# Patient Record
Sex: Female | Born: 1975 | Race: Black or African American | Hispanic: No | Marital: Single | Smoking: Former smoker
Health system: Southern US, Community
[De-identification: ages and names within clinical notes are randomized; demographics above are authoritative.]

## PROBLEM LIST (undated history)

## (undated) ENCOUNTER — Inpatient Hospital Stay (HOSPITAL_COMMUNITY): Payer: Self-pay

## (undated) DIAGNOSIS — L03116 Cellulitis of left lower limb: Secondary | ICD-10-CM

## (undated) DIAGNOSIS — N059 Unspecified nephritic syndrome with unspecified morphologic changes: Secondary | ICD-10-CM

## (undated) DIAGNOSIS — D649 Anemia, unspecified: Secondary | ICD-10-CM

## (undated) DIAGNOSIS — R0600 Dyspnea, unspecified: Secondary | ICD-10-CM

## (undated) DIAGNOSIS — I10 Essential (primary) hypertension: Secondary | ICD-10-CM

## (undated) DIAGNOSIS — E119 Type 2 diabetes mellitus without complications: Secondary | ICD-10-CM

## (undated) DIAGNOSIS — R0989 Other specified symptoms and signs involving the circulatory and respiratory systems: Secondary | ICD-10-CM

## (undated) DIAGNOSIS — J020 Streptococcal pharyngitis: Secondary | ICD-10-CM

## (undated) DIAGNOSIS — M349 Systemic sclerosis, unspecified: Secondary | ICD-10-CM

## (undated) DIAGNOSIS — J189 Pneumonia, unspecified organism: Secondary | ICD-10-CM

## (undated) DIAGNOSIS — L03115 Cellulitis of right lower limb: Secondary | ICD-10-CM

## (undated) DIAGNOSIS — N189 Chronic kidney disease, unspecified: Secondary | ICD-10-CM

## (undated) DIAGNOSIS — F419 Anxiety disorder, unspecified: Secondary | ICD-10-CM

## (undated) DIAGNOSIS — E039 Hypothyroidism, unspecified: Secondary | ICD-10-CM

## (undated) DIAGNOSIS — K219 Gastro-esophageal reflux disease without esophagitis: Secondary | ICD-10-CM

---

## 2001-11-18 ENCOUNTER — Emergency Department (HOSPITAL_COMMUNITY): Admission: EM | Admit: 2001-11-18 | Discharge: 2001-11-18 | Payer: Self-pay | Admitting: Emergency Medicine

## 2001-11-18 ENCOUNTER — Encounter: Payer: Self-pay | Admitting: Emergency Medicine

## 2001-11-24 ENCOUNTER — Encounter: Payer: Self-pay | Admitting: Emergency Medicine

## 2001-11-24 ENCOUNTER — Emergency Department (HOSPITAL_COMMUNITY): Admission: EM | Admit: 2001-11-24 | Discharge: 2001-11-24 | Payer: Self-pay | Admitting: Emergency Medicine

## 2002-11-27 ENCOUNTER — Emergency Department (HOSPITAL_COMMUNITY): Admission: EM | Admit: 2002-11-27 | Discharge: 2002-11-28 | Payer: Self-pay | Admitting: Emergency Medicine

## 2002-12-01 ENCOUNTER — Emergency Department (HOSPITAL_COMMUNITY): Admission: EM | Admit: 2002-12-01 | Discharge: 2002-12-01 | Payer: Self-pay | Admitting: Emergency Medicine

## 2005-09-09 ENCOUNTER — Emergency Department (HOSPITAL_COMMUNITY): Admission: EM | Admit: 2005-09-09 | Discharge: 2005-09-09 | Payer: Self-pay | Admitting: Emergency Medicine

## 2005-10-18 ENCOUNTER — Encounter: Admission: RE | Admit: 2005-10-18 | Discharge: 2005-11-22 | Payer: Self-pay | Admitting: Family Medicine

## 2008-02-15 ENCOUNTER — Emergency Department (HOSPITAL_BASED_OUTPATIENT_CLINIC_OR_DEPARTMENT_OTHER): Admission: EM | Admit: 2008-02-15 | Discharge: 2008-02-15 | Payer: Self-pay | Admitting: Emergency Medicine

## 2008-11-06 ENCOUNTER — Emergency Department (HOSPITAL_COMMUNITY): Admission: EM | Admit: 2008-11-06 | Discharge: 2008-11-06 | Payer: Self-pay | Admitting: Emergency Medicine

## 2009-07-19 ENCOUNTER — Emergency Department (HOSPITAL_COMMUNITY): Admission: EM | Admit: 2009-07-19 | Discharge: 2009-07-19 | Payer: Self-pay | Admitting: Emergency Medicine

## 2009-12-07 ENCOUNTER — Ambulatory Visit (HOSPITAL_COMMUNITY): Admission: RE | Admit: 2009-12-07 | Discharge: 2009-12-07 | Payer: Self-pay | Admitting: Obstetrics

## 2010-05-27 ENCOUNTER — Emergency Department (HOSPITAL_COMMUNITY): Admission: EM | Admit: 2010-05-27 | Discharge: 2010-05-27 | Payer: Self-pay | Admitting: Family Medicine

## 2010-09-24 LAB — URINE MICROSCOPIC-ADD ON

## 2010-09-24 LAB — URINALYSIS, ROUTINE W REFLEX MICROSCOPIC
Bilirubin Urine: NEGATIVE
Glucose, UA: NEGATIVE mg/dL
Hgb urine dipstick: NEGATIVE
Ketones, ur: NEGATIVE mg/dL
Protein, ur: 30 mg/dL — AB
Specific Gravity, Urine: 1.025 (ref 1.005–1.030)
Urobilinogen, UA: 1 mg/dL (ref 0.0–1.0)
pH: 7 (ref 5.0–8.0)

## 2010-09-29 ENCOUNTER — Emergency Department (HOSPITAL_BASED_OUTPATIENT_CLINIC_OR_DEPARTMENT_OTHER)
Admission: EM | Admit: 2010-09-29 | Discharge: 2010-09-29 | Disposition: A | Discharge status: Home / Self Care | Payer: No Typology Code available for payment source | Attending: Emergency Medicine | Admitting: Emergency Medicine

## 2010-09-29 ENCOUNTER — Emergency Department (INDEPENDENT_AMBULATORY_CARE_PROVIDER_SITE_OTHER): Payer: No Typology Code available for payment source

## 2010-09-29 DIAGNOSIS — M7989 Other specified soft tissue disorders: Secondary | ICD-10-CM

## 2010-09-29 DIAGNOSIS — R609 Edema, unspecified: Secondary | ICD-10-CM | POA: Insufficient documentation

## 2010-09-29 DIAGNOSIS — N390 Urinary tract infection, site not specified: Secondary | ICD-10-CM | POA: Insufficient documentation

## 2010-09-29 DIAGNOSIS — R809 Proteinuria, unspecified: Secondary | ICD-10-CM | POA: Insufficient documentation

## 2010-09-29 DIAGNOSIS — R0789 Other chest pain: Secondary | ICD-10-CM

## 2010-09-29 DIAGNOSIS — R7309 Other abnormal glucose: Secondary | ICD-10-CM | POA: Insufficient documentation

## 2010-09-29 LAB — URINALYSIS, ROUTINE W REFLEX MICROSCOPIC
Bilirubin Urine: NEGATIVE
Hgb urine dipstick: NEGATIVE
Ketones, ur: NEGATIVE mg/dL
Nitrite: NEGATIVE
Protein, ur: 30 mg/dL — AB
Urobilinogen, UA: 0.2 mg/dL (ref 0.0–1.0)

## 2010-09-29 LAB — CBC
MCH: 28.3 pg (ref 26.0–34.0)
MCHC: 33 g/dL (ref 30.0–36.0)
MCV: 85.7 fL (ref 78.0–100.0)
Platelets: 374 10*3/uL (ref 150–400)
WBC: 8.8 10*3/uL (ref 4.0–10.5)

## 2010-09-29 LAB — BASIC METABOLIC PANEL
BUN: 10 mg/dL (ref 6–23)
CO2: 26 mEq/L (ref 19–32)
Calcium: 9 mg/dL (ref 8.4–10.5)
Chloride: 107 mEq/L (ref 96–112)
Creatinine, Ser: 0.6 mg/dL (ref 0.4–1.2)
GFR calc Af Amer: >60 mL/min (ref >60)
GFR calc non Af Amer: >60 mL/min (ref >60)
Glucose, Bld: 172 mg/dL — ABNORMAL HIGH (ref 70–99)
Potassium: 3.9 mEq/L (ref 3.5–5.1)
Sodium: 142 mEq/L (ref 135–145)

## 2010-09-29 LAB — DIFFERENTIAL
Basophils Absolute: 0.1 10*3/uL (ref 0.0–0.1)
Basophils Relative: 1 % (ref 0–1)
Monocytes Relative: 13 % — ABNORMAL HIGH (ref 3–12)

## 2010-09-29 LAB — URINE MICROSCOPIC-ADD ON

## 2010-09-29 LAB — PREGNANCY, URINE: Preg Test, Ur: NEGATIVE

## 2011-01-15 ENCOUNTER — Other Ambulatory Visit (HOSPITAL_COMMUNITY): Payer: Self-pay | Admitting: Obstetrics

## 2011-01-15 DIAGNOSIS — Z1231 Encounter for screening mammogram for malignant neoplasm of breast: Secondary | ICD-10-CM

## 2011-02-19 ENCOUNTER — Ambulatory Visit (HOSPITAL_COMMUNITY)
Admission: RE | Admit: 2011-02-19 | Discharge: 2011-02-19 | Disposition: A | Discharge status: Home / Self Care | Payer: BC Managed Care – PPO | Source: Ambulatory Visit | Attending: Obstetrics | Admitting: Obstetrics

## 2011-02-19 DIAGNOSIS — Z1231 Encounter for screening mammogram for malignant neoplasm of breast: Secondary | ICD-10-CM | POA: Insufficient documentation

## 2011-04-06 LAB — URINALYSIS, ROUTINE W REFLEX MICROSCOPIC
Glucose, UA: NEGATIVE
Leukocytes, UA: NEGATIVE
Urobilinogen, UA: 1

## 2011-04-06 LAB — URINE MICROSCOPIC-ADD ON

## 2012-03-31 ENCOUNTER — Emergency Department (HOSPITAL_COMMUNITY)
Admission: EM | Admit: 2012-03-31 | Discharge: 2012-03-31 | Disposition: A | Discharge status: Home / Self Care | Payer: Self-pay | Attending: Emergency Medicine | Admitting: Emergency Medicine

## 2012-03-31 ENCOUNTER — Emergency Department (HOSPITAL_COMMUNITY)
Admission: EM | Admit: 2012-03-31 | Discharge: 2012-03-31 | Discharge status: Against Medical Advice | Payer: Self-pay | Attending: Emergency Medicine | Admitting: Emergency Medicine

## 2012-03-31 ENCOUNTER — Encounter (HOSPITAL_COMMUNITY): Payer: Self-pay

## 2012-03-31 DIAGNOSIS — L02419 Cutaneous abscess of limb, unspecified: Secondary | ICD-10-CM | POA: Insufficient documentation

## 2012-03-31 DIAGNOSIS — Z880 Allergy status to penicillin: Secondary | ICD-10-CM | POA: Insufficient documentation

## 2012-03-31 DIAGNOSIS — M7989 Other specified soft tissue disorders: Secondary | ICD-10-CM | POA: Insufficient documentation

## 2012-03-31 DIAGNOSIS — L0390 Cellulitis, unspecified: Secondary | ICD-10-CM

## 2012-03-31 DIAGNOSIS — R0609 Other forms of dyspnea: Secondary | ICD-10-CM | POA: Insufficient documentation

## 2012-03-31 DIAGNOSIS — R0989 Other specified symptoms and signs involving the circulatory and respiratory systems: Secondary | ICD-10-CM | POA: Insufficient documentation

## 2012-03-31 LAB — BASIC METABOLIC PANEL
CO2: 25 mEq/L (ref 19–32)
Calcium: 9.2 mg/dL (ref 8.4–10.5)
Chloride: 101 mEq/L (ref 96–112)
Creatinine, Ser: 0.63 mg/dL (ref 0.50–1.10)
GFR calc non Af Amer: >90 mL/min (ref >90)
Potassium: 3.7 mEq/L (ref 3.5–5.1)

## 2012-03-31 LAB — CBC WITH DIFFERENTIAL/PLATELET
Basophils Absolute: 0.1 10*3/uL (ref 0.0–0.1)
HCT: 40.7 % (ref 36.0–46.0)
Hemoglobin: 13.5 g/dL (ref 12.0–15.0)
Lymphocytes Relative: 49 % — ABNORMAL HIGH (ref 12–46)
Lymphs Abs: 4.9 10*3/uL — ABNORMAL HIGH (ref 0.7–4.0)
MCH: 28.3 pg (ref 26.0–34.0)
MCHC: 33.2 g/dL (ref 30.0–36.0)
Monocytes Absolute: 0.8 10*3/uL (ref 0.1–1.0)
Neutro Abs: 4 10*3/uL (ref 1.7–7.7)
RBC: 4.77 MIL/uL (ref 3.87–5.11)
WBC: 10.1 10*3/uL (ref 4.0–10.5)

## 2012-03-31 LAB — URINALYSIS, ROUTINE W REFLEX MICROSCOPIC
Glucose, UA: NEGATIVE mg/dL
Leukocytes, UA: NEGATIVE
Nitrite: NEGATIVE
pH: 5.5 (ref 5.0–8.0)

## 2012-03-31 LAB — POCT PREGNANCY, URINE: Preg Test, Ur: NEGATIVE

## 2012-03-31 LAB — URINE MICROSCOPIC-ADD ON

## 2012-03-31 MED ORDER — CLINDAMYCIN HCL 300 MG PO CAPS: 300.0000 mg | ORAL_CAPSULE | Freq: Three times a day (TID) | ORAL | Status: DC

## 2012-03-31 NOTE — ED Notes (Signed)
Lab drawing blood now
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
## 2012-03-31 NOTE — ED Notes (Signed)
C/o bilateral leg swelling since Friday, warm to touch, painful to touch, non-pitting edema, pain 9/10, pt ambulatory. CMS intact.

## 2012-03-31 NOTE — ED Notes (Signed)
Pt has bilateral leg swelling started Friday that is warm to touch, ambulatory, painful to touch and is now red and swollen.

## 2012-04-03 NOTE — ED Provider Notes (Signed)
Medical screening examination/treatment/procedure(s) were performed by non-physician practitioner and as supervising physician I was immediately available for consultation/collaboration.   Gerhard Munch, MD 04/03/12 [phone]

## 2012-04-21 ENCOUNTER — Encounter (HOSPITAL_COMMUNITY): Payer: Self-pay | Admitting: *Deleted

## 2012-04-21 DIAGNOSIS — L03119 Cellulitis of unspecified part of limb: Secondary | ICD-10-CM | POA: Insufficient documentation

## 2012-04-21 DIAGNOSIS — L02419 Cutaneous abscess of limb, unspecified: Principal | ICD-10-CM | POA: Insufficient documentation

## 2012-04-21 DIAGNOSIS — Z792 Long term (current) use of antibiotics: Secondary | ICD-10-CM | POA: Insufficient documentation

## 2012-04-21 LAB — CBC WITH DIFFERENTIAL/PLATELET
Eosinophils Absolute: 0.2 10*3/uL (ref 0.0–0.7)
Eosinophils Relative: 2 % (ref 0–5)
HCT: 38.6 % (ref 36.0–46.0)
Lymphocytes Relative: 36 % (ref 12–46)
Lymphs Abs: 3.8 10*3/uL (ref 0.7–4.0)
MCH: 28.4 pg (ref 26.0–34.0)
MCV: 85 fL (ref 78.0–100.0)
Monocytes Absolute: 1 10*3/uL (ref 0.1–1.0)
Monocytes Relative: 9 % (ref 3–12)
Platelets: 388 10*3/uL (ref 150–400)
RBC: 4.54 MIL/uL (ref 3.87–5.11)

## 2012-04-21 LAB — COMPREHENSIVE METABOLIC PANEL
BUN: 11 mg/dL (ref 6–23)
CO2: 26 mEq/L (ref 19–32)
Calcium: 9.6 mg/dL (ref 8.4–10.5)
Creatinine, Ser: 0.67 mg/dL (ref 0.50–1.10)
GFR calc Af Amer: >90 mL/min (ref >90)
GFR calc non Af Amer: >90 mL/min (ref >90)
Glucose, Bld: 139 mg/dL — ABNORMAL HIGH (ref 70–99)
Total Protein: 7.7 g/dL (ref 6.0–8.3)

## 2012-04-21 NOTE — ED Notes (Signed)
Pt states she was seen at [facility] for her cellulitis, pt states that she took a few days of her antibiotic and it got better and then this past Sunday redness, swelling up back of left leg. Pt states she feels like there is sharp pain going up her left calf muscle.

## 2012-04-22 ENCOUNTER — Encounter (HOSPITAL_COMMUNITY): Payer: Self-pay | Admitting: Emergency Medicine

## 2012-04-22 ENCOUNTER — Observation Stay (HOSPITAL_COMMUNITY)
Admission: EM | Admit: 2012-04-22 | Discharge: 2012-04-22 | Disposition: A | Discharge status: Home / Self Care | Payer: Self-pay | Attending: Emergency Medicine | Admitting: Emergency Medicine

## 2012-04-22 DIAGNOSIS — L0390 Cellulitis, unspecified: Secondary | ICD-10-CM

## 2012-04-22 HISTORY — DX: Anemia, unspecified: D64.9

## 2012-04-22 HISTORY — DX: Streptococcal pharyngitis: J02.0

## 2012-04-22 MED ORDER — CLINDAMYCIN PHOSPHATE 600 MG/50ML IV SOLN
600.0000 mg | Freq: Three times a day (TID) | INTRAVENOUS | Status: DC
  Administered 2012-04-22 (×2): 600 mg via INTRAVENOUS
  Filled 2012-04-22 (×2): qty 50

## 2012-04-22 MED ORDER — MORPHINE SULFATE 4 MG/ML IJ SOLN
4.0000 mg | INTRAMUSCULAR | Status: DC | PRN
Start: 2012-04-22 — End: 2012-04-22
  Administered 2012-04-22 (×3): 4 mg via INTRAVENOUS
  Filled 2012-04-22 (×4): qty 1

## 2012-04-22 MED ORDER — VANCOMYCIN HCL IN DEXTROSE 1-5 GM/200ML-% IV SOLN
1000.0000 mg | Freq: Once | INTRAVENOUS | Status: AC
  Administered 2012-04-22: 1000 mg via INTRAVENOUS
  Filled 2012-04-22: qty 200

## 2012-04-22 MED ORDER — CLINDAMYCIN HCL 150 MG PO CAPS: 450.0000 mg | ORAL_CAPSULE | Freq: Three times a day (TID) | ORAL | Status: DC

## 2012-04-22 MED ORDER — SODIUM CHLORIDE 0.9 % IV SOLN
1000.0000 mL | INTRAVENOUS | Status: DC
  Administered 2012-04-22: 1000 mL via INTRAVENOUS

## 2012-04-22 MED ORDER — SODIUM CHLORIDE 0.9 % IV SOLN
1000.0000 mL | Freq: Once | INTRAVENOUS | Status: AC
  Administered 2012-04-22: 1000 mL via INTRAVENOUS

## 2012-04-22 MED ORDER — ONDANSETRON HCL 4 MG/2ML IJ SOLN
4.0000 mg | Freq: Four times a day (QID) | INTRAMUSCULAR | Status: DC | PRN
  Administered 2012-04-22: 4 mg via INTRAVENOUS
  Filled 2012-04-22: qty 2

## 2012-04-22 NOTE — ED Notes (Signed)
Warm  Compress applied to mild swelling to Rt. Posterior wrist area.  IV site removed.  Pt. Denies any pain or discomfort

## 2012-04-22 NOTE — ED Notes (Signed)
Wait time advised 

## 2012-04-22 NOTE — ED Notes (Signed)
Pt was seen on the 23rd of Oct for cellulitis at [facility].  Pt was taking clindamycin for seven days, which she completed.  She states that the pain has increased and she has noticed the redness is progressively moving upward.  States, "Around my ankles it feels like when you burn your skin with hot grease."

## 2012-04-22 NOTE — ED Provider Notes (Signed)
Rebecca Mullins is a 36 y.o. female on cellulitis protocol is signed out from [facility] B by Dr. Dierdre Highman: Patient has received one dose of IV clindamycin she is scheduled for her second dose of IV clindamycin at 2 PM. Plan is to DC her after her second dose with by mouth clindamycin.  [id number] Physical exam shows patient's resting comfortably. Vital signs stable. Patient is in minimal pain at this point. Circumferential cellulitis to bilateral lower extremities marked with surgical pain. Patient states her pain and the level of the size of the infected area is decreasing.  3:01 PM patient has completed her second dose of IV clindamycin. Erythema and induration or proceeding slowly. She reports a significant subjective improvement in pain in range of motion. I will discharge on oral clindamycin. I've advised her to have a low threshold for returning to the ED if there is any increase in induration, redness, swelling or tingling or paresthesia to the feet as she has circumferential bilateral cellulitis.   Pt verbalized understanding and agrees with care plan. Outpatient follow-up and return precautions given.    New Prescriptions   CLINDAMYCIN (CLEOCIN) 150 MG CAPSULE    Take 3 capsules (450 mg total) by mouth 3 (three) times daily.     Wynetta Emery, PA-C 04/22/12 1527

## 2012-04-22 NOTE — ED Provider Notes (Signed)
History     CSN: [id number]  Arrival date & time 04/21/12  2103   First MD Initiated Contact with Patient 04/22/12 0241      Chief Complaint  Patient presents with  . Recurrent Skin Infections    cellulitis    (Consider location/radiation/quality/duration/timing/severity/associated sxs/prior treatment) HPI   Hx per PT. Went to [facility] 03-31-12 and Dx with cellulitis and given ABx. Followed by [facility] physicians. Now having recurrent redness and pain and swelling and inc warmth tot ouch. Works in nursing home. No F/C. No vomitig, some nausea. Mod in severity. No DM. No trauma. Sharp pains. No calf pain.  Past Medical History  Diagnosis Date  . Cellulitis   . Anemia   . Strep throat     Past Surgical History  Procedure Date  . No past surgeries     Family History  Problem Relation Age of Onset  . Diabetes Mother   . Cancer Mother   . Hyperlipidemia Father     History  Substance Use Topics  . Smoking status: Never Smoker   . Smokeless tobacco: Not on file  . Alcohol Use: 1.2 oz/week    2 Glasses of wine per week     occsionally    OB History    Grav Para Term Preterm Abortions TAB SAB Ect Mult Living                  Review of Systems  Constitutional: Negative for fever and chills.  HENT: Negative for neck pain and neck stiffness.   Eyes: Negative for pain.  Respiratory: Negative for shortness of breath.   Cardiovascular: Negative for chest pain.  Gastrointestinal: Negative for abdominal pain.  Genitourinary: Negative for dysuria.  Musculoskeletal: Negative for back pain.  Skin: Positive for rash.  Neurological: Negative for headaches.  All other systems reviewed and are negative.    Allergies  Penicillins  Home Medications   Current Outpatient Rx  Name Route Sig Dispense Refill  . ACETAMINOPHEN 500 MG PO TABS Oral Take 1,000 mg by mouth every 6 (six) hours as needed. For pain.      BP 161/97  Pulse 89  Temp 97.9 F (36.6 C) (Oral)  Resp 16   SpO2 99%  LMP 03/28/2012  Physical Exam  Nursing note and vitals reviewed. Constitutional: She is oriented to person, place, and time. She appears well-developed and well-nourished.  HENT:  Head: Normocephalic and atraumatic.  Eyes: EOM are normal. Pupils are equal, round, and reactive to light.  Neck: Neck supple.  Cardiovascular: Normal rate, normal heart sounds and intact distal pulses.   Pulmonary/Chest: Effort normal and breath sounds normal. No respiratory distress.  Musculoskeletal:       BLEs with areas of swelling, erythema and inc warmth to touch c/w cellulitis. No cords and neg Homans  Neurological: She is alert and oriented to person, place, and time.  Skin: Skin is warm and dry.    ED Course  Procedures (including critical care time)  Results for orders placed during the hospital encounter of 04/22/12  CBC WITH DIFFERENTIAL      Component Value Range   WBC 10.6 (*) 4.0 - 10.5 K/uL   RBC 4.54  3.87 - 5.11 MIL/uL   Hemoglobin 12.9  12.0 - 15.0 g/dL   HCT 40.9  81.1 - 91.4 %   MCV 85.0  78.0 - 100.0 fL   MCH 28.4  26.0 - 34.0 pg   MCHC 33.4  30.0 - 36.0 g/dL  RDW 13.6  11.5 - 15.5 %   Platelets 388  150 - 400 K/uL   Neutrophils Relative 53  43 - 77 %   Neutro Abs 5.6  1.7 - 7.7 K/uL   Lymphocytes Relative 36  12 - 46 %   Lymphs Abs 3.8  0.7 - 4.0 K/uL   Monocytes Relative 9  3 - 12 %   Monocytes Absolute 1.0  0.1 - 1.0 K/uL   Eosinophils Relative 2  0 - 5 %   Eosinophils Absolute 0.2  0.0 - 0.7 K/uL   Basophils Relative 0  0 - 1 %   Basophils Absolute 0.0  0.0 - 0.1 K/uL  COMPREHENSIVE METABOLIC PANEL      Component Value Range   Sodium 139  135 - 145 mEq/L   Potassium 3.5  3.5 - 5.1 mEq/L   Chloride 103  96 - 112 mEq/L   CO2 26  19 - 32 mEq/L   Glucose, Bld 139 (*) 70 - 99 mg/dL   BUN 11  6 - 23 mg/dL   Creatinine, Ser 1.61  0.50 - 1.10 mg/dL   Calcium 9.6  8.4 - 09.6 mg/dL   Total Protein 7.7  6.0 - 8.3 g/dL   Albumin 3.4 (*) 3.5 - 5.2 g/dL   AST 22   0 - 37 U/L   ALT 17  0 - 35 U/L   Alkaline Phosphatase 117  39 - 117 U/L   Total Bilirubin 0.2 (*) 0.3 - 1.2 mg/dL   GFR calc non Af Amer >90  >90 mL/min   GFR calc Af Amer >90  >90 mL/min   IVfs. IV zofran. IV clinda and cellulitis OBS protocol  Plan IV ABx and serial evaluations, if not worsening d/c home and close outpatient follow up.  MDM   Cellulitis. Labs. IV Abx.         Sunnie Nielsen, MD 04/22/12 [phone]

## 2012-04-22 NOTE — ED Notes (Signed)
Pt. Eating breakfast.  Pt brushed teeth and freshened up.

## 2012-04-22 NOTE — Progress Notes (Signed)
Utilization review completed.  

## 2012-04-23 NOTE — ED Provider Notes (Signed)
Medical screening examination/treatment/procedure(s) were performed by non-physician practitioner and as supervising physician I was immediately available for consultation/collaboration.   Dione Booze, MD 04/23/12 [phone]

## 2013-01-07 ENCOUNTER — Ambulatory Visit: Payer: No Typology Code available for payment source | Attending: Family Medicine | Admitting: Internal Medicine

## 2013-01-07 VITALS — BP 142/90 | HR 91 | Temp 98.3°F | Resp 16 | Ht 63.0 in | Wt 266.0 lb

## 2013-01-07 DIAGNOSIS — L03119 Cellulitis of unspecified part of limb: Secondary | ICD-10-CM | POA: Insufficient documentation

## 2013-01-07 DIAGNOSIS — L0390 Cellulitis, unspecified: Secondary | ICD-10-CM

## 2013-01-07 DIAGNOSIS — L02419 Cutaneous abscess of limb, unspecified: Secondary | ICD-10-CM | POA: Insufficient documentation

## 2013-01-07 DIAGNOSIS — L0291 Cutaneous abscess, unspecified: Secondary | ICD-10-CM

## 2013-01-07 MED ORDER — FUROSEMIDE 20 MG PO TABS: 20.0000 mg | ORAL_TABLET | Freq: Every day | ORAL | Status: DC

## 2013-01-07 MED ORDER — HYDROCODONE-ACETAMINOPHEN 10-325 MG PO TABS: 1.0000 | ORAL_TABLET | Freq: Three times a day (TID) | ORAL | Status: DC | PRN

## 2013-01-07 MED ORDER — SULFAMETHOXAZOLE-TRIMETHOPRIM 800-160 MG PO TABS: 1.0000 | ORAL_TABLET | Freq: Two times a day (BID) | ORAL | Status: DC

## 2013-01-07 NOTE — Patient Instructions (Addendum)
Cellulitis Cellulitis is an infection of the skin and the tissue beneath it. The infected area is usually red and tender. Cellulitis occurs most often in the arms and lower legs.  CAUSES  Cellulitis is caused by bacteria that enter the skin through cracks or cuts in the skin. The most common types of bacteria that cause cellulitis are Staphylococcus and Streptococcus. SYMPTOMS   Redness and warmth.  Swelling.  Tenderness or pain.  Fever. DIAGNOSIS  Your caregiver can usually determine what is wrong based on a physical exam. Blood tests may also be done. TREATMENT  Treatment usually involves taking an antibiotic medicine. HOME CARE INSTRUCTIONS   Take your antibiotics as directed. Finish them even if you start to feel better.  Keep the infected arm or leg elevated to reduce swelling.  Apply a warm cloth to the affected area up to 4 times per day to relieve pain.  Only take over-the-counter or prescription medicines for pain, discomfort, or fever as directed by your caregiver.  Keep all follow-up appointments as directed by your caregiver. SEEK MEDICAL CARE IF:   You notice red streaks coming from the infected area.  Your red area gets larger or turns dark in color.  Your bone or joint underneath the infected area becomes painful after the skin has healed.  Your infection returns in the same area or another area.  You notice a swollen bump in the infected area.  You develop new symptoms. SEEK IMMEDIATE MEDICAL CARE IF:   You have a fever.  You feel very sleepy.  You develop vomiting or diarrhea.  You have a general ill feeling (malaise) with muscle aches and pains. MAKE SURE YOU:   Understand these instructions.  Will watch your condition.  Will get help right away if you are not doing well or get worse. Document Released: 04/04/2005 Document Revised: 12/25/2011 Document Reviewed: 09/10/2011 Exit[employer] Patient Information 2014 [employer].  

## 2013-01-07 NOTE — Progress Notes (Signed)
Patient ID: Rebecca Mullins, female   DOB: 1976-03-13, 37 y.o.   MRN: [id number]   CC: Lower extremity swelling  HPI: Patient is 37 year old female who presents to clinic with main concern of progressively worsening lower extremity swelling that started 2 days prior to this visit associated with redness and tenderness to palpation, warm to touch. Patient describes similar event in the past it was related to cellulitis and has required antibiotic treatment. Patient denies other traumas to the area no other systemic symptoms, no fevers or chills, no abdominal or urinary concerns, no focal neurological symptoms such as numbness or tingling.  Allergies  Allergen Reactions  . Penicillins    Past Medical History  Diagnosis Date  . Cellulitis   . Anemia   . Strep throat    Current Outpatient Prescriptions on File Prior to Visit  Medication Sig Dispense Refill  . acetaminophen (TYLENOL) 500 MG tablet Take 1,000 mg by mouth every 6 (six) hours as needed. For pain.      . clindamycin (CLEOCIN) 150 MG capsule Take 3 capsules (450 mg total) by mouth 3 (three) times daily.  90 capsule  0   No current facility-administered medications on file prior to visit.   Family History  Problem Relation Age of Onset  . Diabetes Mother   . Cancer Mother   . Hyperlipidemia Father   . Diabetes Brother    History   Social History  . Marital Status: Single    Spouse Name: N/A    Number of Children: N/A  . Years of Education: N/A   Occupational History  . Not on file.   Social History Main Topics  . Smoking status: Never Smoker   . Smokeless tobacco: Not on file  . Alcohol Use: 1.2 oz/week    2 Glasses of wine per week     Comment: occsionally  . Drug Use: No  . Sexually Active: Not on file   Other Topics Concern  . Not on file   Social History Narrative  . No narrative on file    Review of Systems  Constitutional: Negative for fever, chills, diaphoresis, activity change, appetite change and  fatigue.  HENT: Negative for ear pain, nosebleeds, congestion, facial swelling, rhinorrhea, neck pain, neck stiffness and ear discharge.   Eyes: Negative for pain, discharge, redness, itching and visual disturbance.  Respiratory: Negative for cough, choking, chest tightness, shortness of breath, wheezing and stridor.   Cardiovascular: Negative for chest pain, palpitations and leg swelling.  Gastrointestinal: Negative for abdominal distention.  Genitourinary: Negative for dysuria, urgency, frequency, hematuria, flank pain, decreased urine volume, difficulty urinating and dyspareunia.  Musculoskeletal: Negative for back pain, joint swelling Neurological: Negative for dizziness, tremors, seizures, syncope, facial asymmetry, speech difficulty, weakness, light-headedness, numbness and headaches.  Hematological: Negative for adenopathy. Does not bruise/bleed easily.  Psychiatric/Behavioral: Negative for hallucinations, behavioral problems, confusion, dysphoric mood, decreased concentration and agitation.    Objective:   Filed Vitals:   01/07/13 1600  BP: 142/90  Pulse: 91  Temp: 98.3 F (36.8 C)  Resp: 16    Physical Exam  Constitutional: Appears well-developed and well-nourished. No distress.  HENT: Normocephalic. External right and left ear normal. Oropharynx is clear and moist.  Eyes: Conjunctivae and EOM are normal. PERRLA, no scleral icterus.  Neck: Normal ROM. Neck supple. No JVD. No tracheal deviation. No thyromegaly.  CVS: RRR, S1/S2 +, no murmurs, no gallops, no carotid bruit.  Pulmonary: Effort and breath sounds normal, no stridor, rhonchi, wheezes, rales.  Abdominal: Soft. BS +,  no distension, tenderness, rebound or guarding.  Musculoskeletal: Normal range of motion. Bilateral lower extremity edema and erythema, tenderness to palpation extending from ankles to mid shin area  Lymphadenopathy: No lymphadenopathy noted, cervical, inguinal. Neuro: Alert. Normal reflexes, muscle  tone coordination. No cranial nerve deficit. Skin: Skin is warm and dry. No rash noted. Not diaphoretic. No erythema. No pallor.  Psychiatric: Normal mood and affect. Behavior, judgment, thought content normal.   Lab Results  Component Value Date   WBC 10.6* 04/21/2012   HGB 12.9 04/21/2012   HCT 38.6 04/21/2012   MCV 85.0 04/21/2012   PLT 388 04/21/2012   Lab Results  Component Value Date   CREATININE 0.67 04/21/2012   BUN 11 04/21/2012   NA 139 04/21/2012   K 3.5 04/21/2012   CL 103 04/21/2012   CO2 26 04/21/2012    No results found for this basename: HGBA1C   Lipid Panel  No results found for this basename: chol, trig, hdl, cholhdl, vldl, ldlcalc       Assessment and plan:   Lower extremity cellulitis - physical exam findings and symptoms consistent with cellulitis, will start patient on Bactrim double strength for 10 days, patient advised to keep extremities elevated while sitting or laying down in bed and to come back and see [place] if her symptoms do not improve over the next 2-3 days

## 2013-01-07 NOTE — Progress Notes (Signed)
Pt here to establish care for re occurrent cellulitis of left leg. Pt was treated for sx in Nov.2013 and prescribed Clindamycin, but states sx has returned last week. Worsening with pulsating pain radiating to l ankle.discoloration,hot to touch noted.

## 2013-01-08 ENCOUNTER — Telehealth: Payer: Self-pay | Admitting: Family Medicine

## 2013-01-08 NOTE — Telephone Encounter (Signed)
Pt would like a Dr's note that states work limitations and recommendations for rest while her leg swelling decreases.  Pt here on 01/08/13 and received a letter saying that per Dr's note it is recommended that pt keeps legs elevated while resting.  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

## 2013-01-08 NOTE — Telephone Encounter (Signed)
Pt here yesterday and would to receive letter for her work stating that she needs to rest her leg at least for a week.  Pt says Dr Izola Price recommended this but pt needs proof for employer.

## 2013-01-19 ENCOUNTER — Ambulatory Visit: Payer: No Typology Code available for payment source | Attending: Family Medicine | Admitting: Internal Medicine

## 2013-01-19 ENCOUNTER — Ambulatory Visit: Payer: No Typology Code available for payment source

## 2013-01-19 VITALS — BP 140/85 | HR 98 | Temp 98.6°F | Resp 16 | Ht 62.0 in | Wt 262.0 lb

## 2013-01-19 DIAGNOSIS — L0390 Cellulitis, unspecified: Secondary | ICD-10-CM

## 2013-01-19 DIAGNOSIS — R609 Edema, unspecified: Secondary | ICD-10-CM | POA: Insufficient documentation

## 2013-01-19 DIAGNOSIS — L02419 Cutaneous abscess of limb, unspecified: Secondary | ICD-10-CM | POA: Insufficient documentation

## 2013-01-19 LAB — CBC
HCT: 40.3 % (ref 36.0–46.0)
Hemoglobin: 13.9 g/dL (ref 12.0–15.0)
WBC: 8 10*3/uL (ref 4.0–10.5)

## 2013-01-19 MED ORDER — DOXYCYCLINE HYCLATE 100 MG PO TABS: 100.0000 mg | ORAL_TABLET | Freq: Two times a day (BID) | ORAL | Status: DC

## 2013-01-19 MED ORDER — POTASSIUM CHLORIDE CRYS ER 20 MEQ PO TBCR: 20.0000 meq | EXTENDED_RELEASE_TABLET | Freq: Every day | ORAL | Status: DC

## 2013-01-19 MED ORDER — FUROSEMIDE 40 MG PO TABS: 40.0000 mg | ORAL_TABLET | Freq: Every day | ORAL | Status: DC

## 2013-01-19 NOTE — Progress Notes (Signed)
Patient presents for follow up of celloulitis of left and right legs; and states the pain has increased and has spread to back of legs and ankles. Wants to know if she can stay on antibiotic to get rid of infection.

## 2013-01-19 NOTE — Patient Instructions (Signed)
1.8 L per day fluid restriction, keep his legs elevated if you are not walking. Come back in 2 days to followup on ultrasound lab work.

## 2013-01-19 NOTE — Progress Notes (Signed)
Patient ID: Rebecca Mullins, female   DOB: [birth year], 37 y.o.   MRN: [id number] Patient Demographics  Calani Gick, is a 37 y.o. female  WUJ:[id number]  [id number]  DOB - 08-20-1975  Chief Complaint  Patient presents with  . Follow-up  . Cellulitis        Subjective:   Rebecca Mullins with History of cellulitis of the left leg in October of last year  is here for followup on repeat left leg cellulitis and bilateral lower extremity edema for the last 10 days, she was seen in this clinic 10 days ago and placed on Bactrim without much benefit, she continues to have bilateral lower activity swelling with some redness and warmth in the leg left leg, denies any fever chills, denies any chest pain or shortness of breath.  Denies any subjective complaints except as above, no active headache, no chest abdominal pain at this time, not short of breath. No focal weakness which is new.  Objective:    Patient Active Problem List   Diagnosis Date Noted  . Edema 01/19/2013  . Cellulitis 01/19/2013     Filed Vitals:   01/19/13 1558  BP: 140/85  Pulse: 98  Temp: 98.6 F (37 C)  TempSrc: Oral  Resp: 16  Height: 5\' 2"  (1.575 m)  Weight: 262 lb (118.842 kg)  SpO2: 97%     Exam   Awake Alert, Oriented X 3, No new F.N deficits, Normal affect [place].AT,PERRAL Supple Neck, mildly elevated JVD, No cervical lymphadenopathy appriciated.  Symmetrical Chest wall movement, Good air movement bilaterally, CTAB RRR,No Gallops,Rubs or new Murmurs, No Parasternal Heave +ve B.Sounds, Abd Soft, Non tender, No organomegaly appriciated, No rebound - guarding or rigidity. No Cyanosis, Clubbing , bilateral 1+ edema left leg more than right, mild warmth on the left leg, some discoloration in the left leg around her ankles.    Data Review   CBC No results found for this basename: WBC, HGB, HCT, PLT, MCV, MCH, MCHC, RDW, NEUTRABS, LYMPHSABS, MONOABS, EOSABS, BASOSABS, BANDABS, BANDSABD,  in the last  168 hours  Chemistries   No results found for this basename: NA, K, CL, CO2, GLUCOSE, BUN, CREATININE, GFRCGP, CALCIUM, MG, AST, ALT, ALKPHOS, BILITOT,  in the last 168 hours ------------------------------------------------------------------------------------------------------------------ No results found for this basename: HGBA1C,  in the last 72 hours ------------------------------------------------------------------------------------------------------------------ No results found for this basename: CHOL, HDL, LDLCALC, TRIG, CHOLHDL, LDLDIRECT,  in the last 72 hours ------------------------------------------------------------------------------------------------------------------ No results found for this basename: TSH, T4TOTAL, FREET3, T3FREE, THYROIDAB,  in the last 72 hours ------------------------------------------------------------------------------------------------------------------ No results found for this basename: VITAMINB12, FOLATE, FERRITIN, TIBC, IRON, RETICCTPCT,  in the last 72 hours  Coagulation profile  No results found for this basename: INR, PROTIME,  in the last 168 hours     Prior to Admission medications   Medication Sig Start Date End Date Taking? Authorizing Provider  acetaminophen (TYLENOL) 500 MG tablet Take 1,000 mg by mouth every 6 (six) hours as needed. For pain.   Yes Historical Provider, MD  furosemide (LASIX) 40 MG tablet Take 1 tablet (40 mg total) by mouth daily. 01/19/13   Leroy Sea, MD  potassium chloride SA (K-DUR,KLOR-CON) 20 MEQ tablet Take 1 tablet (20 mEq total) by mouth daily. 01/19/13   Leroy Sea, MD     Assessment & Plan   1. Left>Right  leg edema with mild cellulitis.- Patient has completed 10 day course of Bactrim without much benefit, I suspect this is more for edema problem, will place  her on Lasix 40 mg daily along with low-dose potassium, will try doxycycline. Her urine pregnancy test here is negative. We'll also check  bilateral lower extremity venous duplex and get her back in 2 days. Note she was on Bactrim and Lasix since her last visit, will like to check CBC, BMP and TSH at this time. Of note last year when she had cellulitis she says she was ruled out for DVT at that time. Also I noted elevated JVD will order a baseline echo gram to rule out right-sided heart dysfunction.   2. Morbid obesity counseled on diet and exercise.     Routine health maintenance.  Screening labs. CBC, CMP, TSH, A1c, ordered will come back in 3 days      Leroy Sea M.D on 01/19/2013 at 4:17 PM

## 2013-01-20 ENCOUNTER — Ambulatory Visit (HOSPITAL_COMMUNITY)
Admission: RE | Admit: 2013-01-20 | Discharge: 2013-01-20 | Disposition: A | Discharge status: Home / Self Care | Payer: No Typology Code available for payment source | Source: Ambulatory Visit | Attending: Internal Medicine | Admitting: Internal Medicine

## 2013-01-20 DIAGNOSIS — L0390 Cellulitis, unspecified: Secondary | ICD-10-CM

## 2013-01-20 DIAGNOSIS — M7989 Other specified soft tissue disorders: Secondary | ICD-10-CM

## 2013-01-20 DIAGNOSIS — R609 Edema, unspecified: Secondary | ICD-10-CM

## 2013-01-20 DIAGNOSIS — M79609 Pain in unspecified limb: Secondary | ICD-10-CM | POA: Insufficient documentation

## 2013-01-20 LAB — URINALYSIS W MICROSCOPIC + REFLEX CULTURE
Bacteria, UA: NONE SEEN
Casts: NONE SEEN
Glucose, UA: NEGATIVE mg/dL
Hgb urine dipstick: NEGATIVE
Ketones, ur: NEGATIVE mg/dL
Protein, ur: 30 mg/dL — AB
pH: 5.5 (ref 5.0–8.0)

## 2013-01-20 LAB — COMPLETE METABOLIC PANEL WITH GFR
AST: 18 U/L (ref 0–37)
Albumin: 4.1 g/dL (ref 3.5–5.2)
Alkaline Phosphatase: 83 U/L (ref 39–117)
BUN: 9 mg/dL (ref 6–23)
Calcium: 9.8 mg/dL (ref 8.4–10.5)
Chloride: 103 mEq/L (ref 96–112)
GFR, Est Non African American: 86 mL/min
Glucose, Bld: 109 mg/dL — ABNORMAL HIGH (ref 70–99)
Potassium: 4.2 mEq/L (ref 3.5–5.3)
Sodium: 136 mEq/L (ref 135–145)
Total Protein: 7.9 g/dL (ref 6.0–8.3)

## 2013-01-20 LAB — TSH: TSH: 4.613 u[IU]/mL — ABNORMAL HIGH (ref 0.350–4.500)

## 2013-01-20 NOTE — Progress Notes (Signed)
VASCULAR LAB PRELIMINARY  PRELIMINARY  PRELIMINARY  PRELIMINARY  Bilateral lower extremity venous duplex  completed.    Preliminary report:  Bilateral:  No evidence of DVT, superficial thrombosis, or Baker's Cyst.    Rochell Puett, RVT 01/20/2013, 8:47 AM

## 2013-01-21 ENCOUNTER — Ambulatory Visit (HOSPITAL_COMMUNITY)
Admission: RE | Admit: 2013-01-21 | Discharge: 2013-01-21 | Disposition: A | Discharge status: Home / Self Care | Payer: No Typology Code available for payment source | Source: Ambulatory Visit | Attending: Internal Medicine | Admitting: Internal Medicine

## 2013-01-21 DIAGNOSIS — R609 Edema, unspecified: Secondary | ICD-10-CM

## 2013-01-21 DIAGNOSIS — L0390 Cellulitis, unspecified: Secondary | ICD-10-CM

## 2013-01-21 LAB — URINE CULTURE: Colony Count: 2000

## 2013-01-21 NOTE — Progress Notes (Signed)
Echocardiogram 2D Echocardiogram has been performed.  Rebecca Mullins 01/21/2013, 11:38 AM

## 2013-01-22 ENCOUNTER — Ambulatory Visit: Payer: No Typology Code available for payment source | Attending: Family Medicine | Admitting: Family Medicine

## 2013-01-22 ENCOUNTER — Encounter: Payer: Self-pay | Admitting: Family Medicine

## 2013-01-22 VITALS — BP 146/86 | HR 96 | Temp 98.6°F | Resp 22

## 2013-01-22 DIAGNOSIS — R609 Edema, unspecified: Secondary | ICD-10-CM

## 2013-01-22 DIAGNOSIS — R600 Localized edema: Secondary | ICD-10-CM

## 2013-01-22 NOTE — Progress Notes (Signed)
PT HERE FOR DOPPLER RESULTS S/P CELLULITIS L LEG. STATES SHE WILL START TAKING PRESCRIBED ATB TOMORROW. VSS. NO DVT NOTED ON DOPPLERS REPORT.

## 2013-01-22 NOTE — Patient Instructions (Signed)
Edema  Edema is an abnormal build-up of fluids in tissues. Because this is partly dependent on gravity (water flows to the lowest place), it is more common in the legs and thighs (lower extremities). It is also common in the looser tissues, like around the eyes. Painless swelling of the feet and ankles is common and increases as a person ages. It may affect both legs and may include the calves or even thighs. When squeezed, the fluid may move out of the affected area and may leave a dent for a few moments.  CAUSES    Prolonged standing or sitting in one place for extended periods of time. Movement helps pump tissue fluid into the veins, and absence of movement prevents this, resulting in edema.   Varicose veins. The valves in the veins do not work as well as they should. This causes fluid to leak into the tissues.   Fluid and salt overload.   Injury, burn, or surgery to the leg, ankle, or foot, may damage veins and allow fluid to leak out.   Sunburn damages vessels. Leaky vessels allow fluid to go out into the sunburned tissues.   Allergies (from insect bites or stings, medications or chemicals) cause swelling by allowing vessels to become leaky.   Protein in the blood helps keep fluid in your vessels. Low protein, as in malnutrition, allows fluid to leak out.   Hormonal changes, including pregnancy and menstruation, cause fluid retention. This fluid may leak out of vessels and cause edema.   Medications that cause fluid retention. Examples are sex hormones, blood pressure medications, steroid treatment, or anti-depressants.   Some illnesses cause edema, especially heart failure, kidney disease, or liver disease.   Surgery that cuts veins or lymph nodes, such as surgery done for the heart or for breast cancer, may result in edema.  DIAGNOSIS   Your caregiver is usually easily able to determine what is causing your swelling (edema) by simply asking what is wrong (getting a history) and examining you (doing  a physical). Sometimes x-rays, EKG (electrocardiogram or heart tracing), and blood work may be done to evaluate for underlying medical illness.  TREATMENT   General treatment includes:   Leg elevation (or elevation of the affected body part).   Restriction of fluid intake.   Prevention of fluid overload.   Compression of the affected body part. Compression with elastic bandages or support stockings squeezes the tissues, preventing fluid from entering and forcing it back into the blood vessels.   Diuretics (also called water pills or fluid pills) pull fluid out of your body in the form of increased urination. These are effective in reducing the swelling, but can have side effects and must be used only under your caregiver's supervision. Diuretics are appropriate only for some types of edema.  The specific treatment can be directed at any underlying causes discovered. Heart, liver, or kidney disease should be treated appropriately.  HOME CARE INSTRUCTIONS    Elevate the legs (or affected body part) above the level of the heart, while lying down.   Avoid sitting or standing still for prolonged periods of time.   Avoid putting anything directly under the knees when lying down, and do not wear constricting clothing or garters on the upper legs.   Exercising the legs causes the fluid to work back into the veins and lymphatic channels. This may help the swelling go down.   The pressure applied by elastic bandages or support stockings can help reduce ankle swelling.     A low-salt diet may help reduce fluid retention and decrease the ankle swelling.   Take any medications exactly as prescribed.  SEEK MEDICAL CARE IF:   Your edema is not responding to recommended treatments.  SEEK IMMEDIATE MEDICAL CARE IF:    You develop shortness of breath or chest pain.   You cannot breathe when you lay down; or if, while lying down, you have to get up and go to the window to get your breath.   You are having increasing  swelling without relief from treatment.   You develop a fever over 102 F (38.9 C).   You develop pain or redness in the areas that are swollen.   Tell your caregiver right away if you have gained 3 lb/1.4 kg in 1 day or 5 lb/2.3 kg in a week.  MAKE SURE YOU:    Understand these instructions.   Will watch your condition.   Will get help right away if you are not doing well or get worse.  Document Released: 06/25/2005 Document Revised: 12/25/2011 Document Reviewed: 02/11/2008  [employer] Patient Information 2014 [employer].

## 2013-01-22 NOTE — Progress Notes (Signed)
Patient ID: Rebecca Mullins, female   DOB: 1975-09-23, 37 y.o.   MRN: [id number]  CC:  Chief Complaint  Patient presents with  . Follow-up    DOPPLER RESULTS   HPI: PT reports that she is still having edema in legs and it is painful.  She has not been taking the lasix regularly because she can't sleep and has to get up multiple times in the night to urinate.  She says that she completed the antibiotics. 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   Allergies  Allergen Reactions  . Penicillins    Past Medical History  Diagnosis Date  . Cellulitis   . Anemia   . Strep throat    Current Outpatient Prescriptions on File Prior to Visit  Medication Sig Dispense Refill  . acetaminophen (TYLENOL) 500 MG tablet Take 1,000 mg by mouth every 6 (six) hours as needed. For pain.      . furosemide (LASIX) 40 MG tablet Take 1 tablet (40 mg total) by mouth daily.  15 tablet  0  . potassium chloride SA (K-DUR,KLOR-CON) 20 MEQ tablet Take 1 tablet (20 mEq total) by mouth daily.  15 tablet  0  . doxycycline (VIBRA-TABS) 100 MG tablet Take 1 tablet (100 mg total) by mouth 2 (two) times daily.  14 tablet  0   No current facility-administered medications on file prior to visit.   Family History  Problem Relation Age of Onset  . Diabetes Mother   . Cancer Mother   . Hyperlipidemia Father   . Diabetes Brother    History   Social History  . Marital Status: Single    Spouse Name: N/A    Number of Children: N/A  . Years of Education: N/A   Occupational History  . Not on file.   Social History Main Topics  . Smoking status: Never Smoker   . Smokeless tobacco: Not on file  . Alcohol Use: 1.2 oz/week    2 Glasses of wine per week     Comment: occsionally  . Drug Use: No  . Sexually Active: Not on file   Other Topics Concern  . Not on file   Social History Narrative  . No narrative on file    Review of Systems  Constitutional:  Negative for fever, chills, diaphoresis, activity change, appetite change and fatigue.  HENT: Negative for ear pain, nosebleeds, congestion, facial swelling, rhinorrhea, neck pain, neck stiffness and ear discharge.   Eyes: Negative for pain, discharge, redness, itching and visual disturbance.  Respiratory: Negative for cough, choking, chest tightness, shortness of breath, wheezing and stridor.   Cardiovascular: Negative for chest pain, palpitations and leg swelling.  Gastrointestinal: Negative for abdominal distention.  Genitourinary: Negative for dysuria, urgency, frequency, hematuria, flank pain, decreased urine volume, difficulty urinating and dyspareunia.  Musculoskeletal: Negative for back pain, joint swelling, arthralgias and gait problem.  Neurological: Negative for dizziness, tremors, seizures, syncope, facial asymmetry, speech difficulty, weakness, light-headedness, numbness and headaches.  Hematological: Negative for adenopathy. Does not bruise/bleed easily.  Psychiatric/Behavioral: Negative for hallucinations, behavioral problems, confusion, dysphoric mood, decreased concentration and agitation.    Objective:   Filed Vitals:   01/22/13 1734  BP: 146/86  Pulse: 96  Temp: 98.6 F (37 C)  Resp: 22    Physical Exam  Constitutional: Appears well-developed and well-nourished. No distress.  HENT: Normocephalic. External right and left ear normal. Oropharynx is clear and  moist.  Eyes: Conjunctivae and EOM are normal. PERRLA, no scleral icterus.  Neck: Normal ROM. Neck supple. No JVD. No tracheal deviation. No thyromegaly.  CVS: RRR, S1/S2 +, no murmurs, no gallops, no carotid bruit.  Pulmonary: Effort and breath sounds normal, no stridor, rhonchi, wheezes, rales.  Abdominal: Soft. BS +,  no distension, tenderness, rebound or guarding.  Musculoskeletal: 2+ pitting edema bilateral LEs  Lymphadenopathy: No lymphadenopathy noted, cervical, inguinal. Neuro: Alert. Normal reflexes,  muscle tone coordination. No cranial nerve deficit. Skin: Skin is warm and dry. No rash noted. Not diaphoretic. No erythema. No pallor.  Psychiatric: Normal mood and affect. Behavior, judgment, thought content normal.   Lab Results  Component Value Date   WBC 8.0 01/19/2013   HGB 13.9 01/19/2013   HCT 40.3 01/19/2013   MCV 81.6 01/19/2013   PLT 376 01/19/2013   Lab Results  Component Value Date   CREATININE 0.87 01/19/2013   BUN 9 01/19/2013   NA 136 01/19/2013   K 4.2 01/19/2013   CL 103 01/19/2013   CO2 23 01/19/2013    No results found for this basename: HGBA1C   Lipid Panel  No results found for this basename: chol, trig, hdl, cholhdl, vldl, ldlcalc       Assessment and plan:   Patient Active Problem List   Diagnosis Date Noted  . Edema 01/19/2013  . Cellulitis 01/19/2013   Pt has bilateral Bilateral edema   Dependent edema  Reviewed Echo results with patient and there was no abnormalities reported. No CHF.    Referral to dermatology for evaluation of her legs and edema and changing skin. No signs of infection seen at this time. - pt not able to tolerate compression stocking because of pain.   Pt advised to take lasix as prescribed every day and take the potassium until her legs start to go down significantly in fluid.   The patient was given clear instructions to go to ER or return to medical center if symptoms don't improve, worsen or new problems develop.  The patient verbalized understanding.  The patient was told to call to get any lab results if not heard anything in the next week.    FU in 1 month  Rodney Langton, MD, CDE, FAAFP [facility] [place], [place]

## 2013-02-23 ENCOUNTER — Ambulatory Visit: Payer: No Typology Code available for payment source

## 2013-04-09 ENCOUNTER — Ambulatory Visit: Payer: No Typology Code available for payment source

## 2014-05-02 ENCOUNTER — Encounter (HOSPITAL_COMMUNITY): Payer: Self-pay | Admitting: Emergency Medicine

## 2014-05-02 ENCOUNTER — Emergency Department (HOSPITAL_COMMUNITY): Payer: No Typology Code available for payment source

## 2014-05-02 ENCOUNTER — Emergency Department (HOSPITAL_COMMUNITY)
Admission: EM | Admit: 2014-05-02 | Discharge: 2014-05-02 | Disposition: A | Discharge status: Home / Self Care | Payer: No Typology Code available for payment source | Attending: Emergency Medicine | Admitting: Emergency Medicine

## 2014-05-02 DIAGNOSIS — Y9389 Activity, other specified: Secondary | ICD-10-CM | POA: Diagnosis not present

## 2014-05-02 DIAGNOSIS — Z872 Personal history of diseases of the skin and subcutaneous tissue: Secondary | ICD-10-CM | POA: Insufficient documentation

## 2014-05-02 DIAGNOSIS — Z79899 Other long term (current) drug therapy: Secondary | ICD-10-CM | POA: Diagnosis not present

## 2014-05-02 DIAGNOSIS — S299XXA Unspecified injury of thorax, initial encounter: Secondary | ICD-10-CM | POA: Diagnosis not present

## 2014-05-02 DIAGNOSIS — Z862 Personal history of diseases of the blood and blood-forming organs and certain disorders involving the immune mechanism: Secondary | ICD-10-CM | POA: Diagnosis not present

## 2014-05-02 DIAGNOSIS — R002 Palpitations: Secondary | ICD-10-CM | POA: Insufficient documentation

## 2014-05-02 DIAGNOSIS — Y92410 Unspecified street and highway as the place of occurrence of the external cause: Secondary | ICD-10-CM | POA: Diagnosis not present

## 2014-05-02 DIAGNOSIS — Z880 Allergy status to penicillin: Secondary | ICD-10-CM | POA: Diagnosis not present

## 2014-05-02 DIAGNOSIS — S199XXA Unspecified injury of neck, initial encounter: Secondary | ICD-10-CM | POA: Diagnosis present

## 2014-05-02 DIAGNOSIS — S3992XA Unspecified injury of lower back, initial encounter: Secondary | ICD-10-CM | POA: Diagnosis not present

## 2014-05-02 DIAGNOSIS — Z792 Long term (current) use of antibiotics: Secondary | ICD-10-CM | POA: Diagnosis not present

## 2014-05-02 DIAGNOSIS — R0602 Shortness of breath: Secondary | ICD-10-CM | POA: Diagnosis not present

## 2014-05-02 DIAGNOSIS — Z8709 Personal history of other diseases of the respiratory system: Secondary | ICD-10-CM | POA: Insufficient documentation

## 2014-05-02 DIAGNOSIS — M7912 Myalgia of auxiliary muscles, head and neck: Secondary | ICD-10-CM

## 2014-05-02 MED ORDER — CYCLOBENZAPRINE HCL 10 MG PO TABS: 10.0000 mg | ORAL_TABLET | Freq: Two times a day (BID) | ORAL | Status: DC | PRN

## 2014-05-02 MED ORDER — TRAMADOL HCL 50 MG PO TABS: 50.0000 mg | ORAL_TABLET | Freq: Four times a day (QID) | ORAL | Status: DC | PRN

## 2014-05-02 MED ORDER — CYCLOBENZAPRINE HCL 10 MG PO TABS
10.0000 mg | ORAL_TABLET | Freq: Once | ORAL | Status: AC
  Administered 2014-05-02: 10 mg via ORAL
  Filled 2014-05-02: qty 1

## 2014-05-02 MED ORDER — TRAMADOL HCL 50 MG PO TABS
50.0000 mg | ORAL_TABLET | Freq: Once | ORAL | Status: AC
Start: 2014-05-02 — End: 2014-05-02
  Administered 2014-05-02: 50 mg via ORAL
  Filled 2014-05-02: qty 1
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
## 2014-05-02 NOTE — Discharge Instructions (Signed)
Take Tramadol as needed for pain. Take Flexeril as needed for muscle spasm. Refer to attached documents for more information.

## 2014-05-03 NOTE — ED Provider Notes (Signed)
Medical screening examination/treatment/procedure(s) were performed by non-physician practitioner and as supervising physician I was immediately available for consultation/collaboration.   Delora Fuel, MD 67/12/45 [id number]

## 2014-10-08 ENCOUNTER — Ambulatory Visit (INDEPENDENT_AMBULATORY_CARE_PROVIDER_SITE_OTHER): Payer: No Typology Code available for payment source | Admitting: Family Medicine

## 2014-10-08 VITALS — BP 175/105 | HR 91 | Temp 98.0°F | Resp 18 | Ht 64.0 in | Wt 264.8 lb

## 2014-10-08 DIAGNOSIS — IMO0001 Reserved for inherently not codable concepts without codable children: Secondary | ICD-10-CM

## 2014-10-08 DIAGNOSIS — L03012 Cellulitis of left finger: Secondary | ICD-10-CM | POA: Diagnosis not present

## 2014-10-08 DIAGNOSIS — J039 Acute tonsillitis, unspecified: Secondary | ICD-10-CM | POA: Diagnosis not present

## 2014-10-08 DIAGNOSIS — R252 Cramp and spasm: Secondary | ICD-10-CM

## 2014-10-08 MED ORDER — HYDROCODONE-ACETAMINOPHEN 5-325 MG PO TABS: 1.0000 | ORAL_TABLET | Freq: Every evening | ORAL | Status: DC | PRN

## 2014-10-08 MED ORDER — METHOCARBAMOL 500 MG PO TABS: 500.0000 mg | ORAL_TABLET | Freq: Every day | ORAL | Status: DC

## 2014-10-08 MED ORDER — CLINDAMYCIN HCL 150 MG PO CAPS: 150.0000 mg | ORAL_CAPSULE | Freq: Three times a day (TID) | ORAL | Status: DC

## 2014-10-08 NOTE — Patient Instructions (Signed)
Tonsillitis Tonsillitis is an infection of the throat that causes the tonsils to become red, tender, and swollen. Tonsils are collections of lymphoid tissue at the back of the throat. Each tonsil has crevices (crypts). Tonsils help fight nose and throat infections and keep infection from spreading to other parts of the body for the first 18 months of life.  CAUSES Sudden (acute) tonsillitis is usually caused by infection with streptococcal bacteria. Long-lasting (chronic) tonsillitis occurs when the crypts of the tonsils become filled with pieces of food and bacteria, which makes it easy for the tonsils to become repeatedly infected. SYMPTOMS  Symptoms of tonsillitis include:  A sore throat, with possible difficulty swallowing.  White patches on the tonsils.  Fever.  Tiredness.  New episodes of snoring during sleep, when you did not snore before.  Small, foul-smelling, yellowish-white pieces of material (tonsilloliths) that you occasionally cough up or spit out. The tonsilloliths can also cause you to have bad breath. DIAGNOSIS Tonsillitis can be diagnosed through a physical exam. Diagnosis can be confirmed with the results of lab tests, including a throat culture. TREATMENT  The goals of tonsillitis treatment include the reduction of the severity and duration of symptoms and prevention of associated conditions. Symptoms of tonsillitis can be improved with the use of steroids to reduce the swelling. Tonsillitis caused by bacteria can be treated with antibiotic medicines. Usually, treatment with antibiotic medicines is started before the cause of the tonsillitis is known. However, if it is determined that the cause is not bacterial, antibiotic medicines will not treat the tonsillitis. If attacks of tonsillitis are severe and frequent, your health care provider may recommend surgery to remove the tonsils (tonsillectomy). HOME CARE INSTRUCTIONS   Rest as much as possible and get plenty of  sleep.  Drink plenty of fluids. While the throat is very sore, eat soft foods or liquids, such as sherbet, soups, or instant breakfast drinks.  Eat frozen ice pops.  Gargle with a warm or cold liquid to help soothe the throat. Mix 1/4 teaspoon of salt and 1/4 teaspoon of baking soda in 8 oz of water. SEEK MEDICAL CARE IF:   Large, tender lumps develop in your neck.  A rash develops.  A green, yellow-brown, or bloody substance is coughed up.  You are unable to swallow liquids or food for 24 hours.  You notice that only one of the tonsils is swollen. SEEK IMMEDIATE MEDICAL CARE IF:   You develop any new symptoms such as vomiting, severe headache, stiff neck, chest pain, or trouble breathing or swallowing.  You have severe throat pain along with drooling or voice changes.  You have severe pain, unrelieved with recommended medications.  You are unable to fully open the mouth.  You develop redness, swelling, or severe pain anywhere in the neck.  You have a fever. MAKE SURE YOU:   Understand these instructions.  Will watch your condition.  Will get help right away if you are not doing well or get worse. Document Released: 04/04/2005 Document Revised: 11/09/2013 Document Reviewed: 12/12/2012 [facility] Patient Information 2015 [place], [place]. This information is not intended to replace advice given to you by your health care provider. Make sure you discuss any questions you have with your health care provider. Paronychia Paronychia is an inflammatory reaction involving the folds of the skin surrounding the fingernail. This is commonly caused by an infection in the skin around a nail. The most common cause of paronychia is frequent wetting of the hands (as seen with  bartenders, food servers, nurses or others who wet their hands). This makes the skin around the fingernail susceptible to infection by bacteria (germs) or fungus. Other predisposing factors are:  Aggressive  manicuring.  Nail biting.  Thumb sucking. The most common cause is a staphylococcal (a type of germ) infection, or a fungal (Candida) infection. When caused by a germ, it usually comes on suddenly with redness, swelling, pus and is often painful. It may get under the nail and form an abscess (collection of pus), or form an abscess around the nail. If the nail itself is infected with a fungus, the treatment is usually prolonged and may require oral medicine for up to one year. Your caregiver will determine the length of time treatment is required. The paronychia caused by bacteria (germs) may largely be avoided by not pulling on hangnails or picking at cuticles. When the infection occurs at the tips of the finger it is called felon. When the cause of paronychia is from the herpes simplex virus (HSV) it is called herpetic whitlow. TREATMENT  When an abscess is present treatment is often incision and drainage. This means that the abscess must be cut open so the pus can get out. When this is done, the following home care instructions should be followed. HOME CARE INSTRUCTIONS   It is important to keep the affected fingers very dry. Rubber or plastic gloves over cotton gloves should be used whenever the hand must be placed in water.  Keep wound clean, dry and dressed as suggested by your caregiver between warm soaks or warm compresses.  Soak in warm water for fifteen to twenty minutes three to four times per day for bacterial infections. Fungal infections are very difficult to treat, so often require treatment for long periods of time.  For bacterial (germ) infections take antibiotics (medicine which kill germs) as directed and finish the prescription, even if the problem appears to be solved before the medicine is gone.  Only take over-the-counter or prescription medicines for pain, discomfort, or fever as directed by your caregiver. SEEK IMMEDIATE MEDICAL CARE IF:  You have redness, swelling, or  increasing pain in the wound.  You notice pus coming from the wound.  You have a fever.  You notice a bad smell coming from the wound or dressing. Document Released: 12/19/2000 Document Revised: 09/17/2011 Document Reviewed: 08/20/2008 [facility] Patient Information 2015 [place], [place]. This information is not intended to replace advice given to you by your health care provider. Make sure you discuss any questions you have with your health care provider.

## 2014-10-08 NOTE — Progress Notes (Signed)
This chart was scribed for Dr. Robyn Haber, MD by Erling Conte, Medical Scribe. This patient was seen in Room 8 and the patient's care was started at 6:54 PM.  Patient ID: Rebecca Mullins MRN: [id number], DOB: July 11, 1975, 39 y.o. Date of Encounter: 10/08/2014, 7:05 PM  Primary Physician: No PCP Per Patient  Chief Complaint:  Chief Complaint  Patient presents with  . hand swelling    left hand x2 weeks  . Abdominal Cramping    x2 day     HPI: 39 y.o. year old female with history below presents with swelling to her left ring finger for two weeks. She has been taking Zithromax with no relief. Pt states she is in the process of being treated for an UTI and strep throat. Pt states she was previously having brownish red urine but states that her urine is now back to yellow in color. She notes her sore throat has not yet resolved. She denies any abdominal pain or dysuria.   Pt is also having cramping in her bilateral upper thighs that began last night. .She states the pain is exacerbated with sitting or lying down. She denies any injury to the area.   CNA with [employer]  Past Medical History  Diagnosis Date  . Cellulitis   . Anemia   . Strep throat      Home Meds: Prior to Admission medications   Medication Sig Start Date End Date Taking? Authorizing Provider  azithromycin (ZITHROMAX) 250 MG tablet Take 250 mg by mouth daily.   Yes Historical Provider, MD    Allergies:  Allergies  Allergen Reactions  . Penicillins     History   Social History  . Marital Status: Single    Spouse Name: N/A  . Number of Children: N/A  . Years of Education: N/A   Occupational History  . Not on file.   Social History Main Topics  . Smoking status: Never Smoker   . Smokeless tobacco: Not on file  . Alcohol Use: 1.2 oz/week    2 Glasses of wine per week     Comment: occsionally  . Drug Use: No  . Sexual Activity: Not on file   Other Topics Concern  . Not on file   Social  History Narrative     Review of Systems: Constitutional: negative for chills, fever, night sweats, weight changes, or fatigue  HEENT: positive for sore throat. negative for vision changes, hearing loss, congestion, rhinorrhea, ST, epistaxis, or sinus pressure Cardiovascular: negative for chest pain or palpitations Respiratory: negative for hemoptysis, wheezing, shortness of breath, or cough Abdominal: negative for abdominal pain, nausea, vomiting, diarrhea, or constipation Dermatological: negative for rash Neurologic: negative for headache, dizziness, or syncope Musk: positive for swelling to left ring finger, arthralgias and myalgias.  All other systems reviewed and are otherwise negative with the exception to those above and in the HPI.   Physical Exam: Blood pressure 175/105, pulse 91, temperature 98 F (36.7 C), temperature source Oral, resp. rate 18, height 5\' 4"  (1.626 m), weight 264 lb 12.8 oz (120.112 kg), SpO2 96 %., Body mass index is 45.43 kg/(m^2). General: Well developed, well nourished, in no acute distress. Head: Normocephalic, atraumatic, eyes without discharge, sclera non-icteric, nares are without discharge. Bilateral auditory canals clear, TM's are without perforation, pearly grey and translucent with reflective cone of light bilaterally. Oral cavity moist. Tonsils swollen with white exudate  Neck: Supple. No thyromegaly. Full ROM. No lymphadenopathy. Lungs: Clear bilaterally to auscultation without wheezes,  rales, or rhonchi. Breathing is unlabored. Heart: RRR with S1 S2. No murmurs, rubs, or gallops appreciated. Abdomen: Soft, non-tender, non-distended with normoactive bowel sounds. No hepatomegaly. No rebound/guarding. No obvious abdominal masses. Msk:  Strength and tone normal for age. Extremities/Skin: Warm and dry. No clubbing or cyanosis. No edema. No rashes or suspicious lesions. Neuro: Alert and oriented X 3. Moves all extremities spontaneously. Gait is normal.  CNII-XII grossly in tact. Psych:  Responds to questions appropriately with a normal affect.   Labs:   ASSESSMENT AND PLAN:  39 y.o. year old female with   1. Paronychia of fourth finger, left   2. Acute tonsillitis   3. Muscle cramps     Meds ordered this encounter  Medications  . azithromycin (ZITHROMAX) 250 MG tablet    Sig: Take 250 mg by mouth daily.  . clindamycin (CLEOCIN) 150 MG capsule    Sig: Take 1 capsule (150 mg total) by mouth 3 (three) times daily.    Dispense:  30 capsule    Refill:  0  . HYDROcodone-acetaminophen (NORCO) 5-325 MG per tablet    Sig: Take 1 tablet by mouth at bedtime as needed for moderate pain.    Dispense:  12 tablet    Refill:  0  . methocarbamol (ROBAXIN) 500 MG tablet    Sig: Take 1 tablet (500 mg total) by mouth at bedtime.    Dispense:  10 tablet    Refill:  1   This chart was scribed in my presence and reviewed by me personally.    ICD-9-CM ICD-10-CM   1. Paronychia of fourth finger, left 681.02 L03.012 clindamycin (CLEOCIN) 150 MG capsule  2. Acute tonsillitis 463 J03.90 clindamycin (CLEOCIN) 150 MG capsule     HYDROcodone-acetaminophen (NORCO) 5-325 MG per tablet  3. Muscle cramps 729.82 R25.2 methocarbamol (ROBAXIN) 500 MG tablet     Signed, Robyn Haber, MD   Signed, Robyn Haber, MD 10/08/2014 7:05 PM

## 2014-10-27 ENCOUNTER — Other Ambulatory Visit: Payer: Self-pay | Admitting: Family Medicine

## 2014-10-30 ENCOUNTER — Encounter (HOSPITAL_COMMUNITY): Payer: Self-pay | Admitting: Emergency Medicine

## 2014-10-30 ENCOUNTER — Emergency Department (HOSPITAL_COMMUNITY)
Admission: EM | Admit: 2014-10-30 | Discharge: 2014-10-30 | Disposition: A | Discharge status: Home / Self Care | Payer: No Typology Code available for payment source | Source: Home / Self Care | Attending: Family Medicine | Admitting: Family Medicine

## 2014-10-30 DIAGNOSIS — L03012 Cellulitis of left finger: Secondary | ICD-10-CM | POA: Diagnosis not present

## 2014-10-30 MED ORDER — SULFAMETHOXAZOLE-TRIMETHOPRIM 800-160 MG PO TABS: 2.0000 | ORAL_TABLET | Freq: Two times a day (BID) | ORAL | Status: DC

## 2014-10-30 NOTE — Discharge Instructions (Signed)
Thank you for coming in today.  Paronychia Paronychia is an inflammatory reaction involving the folds of the skin surrounding the fingernail. This is commonly caused by an infection in the skin around a nail. The most common cause of paronychia is frequent wetting of the hands (as seen with bartenders, food servers, nurses or others who wet their hands). This makes the skin around the fingernail susceptible to infection by bacteria (germs) or fungus. Other predisposing factors are:  Aggressive manicuring.  Nail biting.  Thumb sucking. The most common cause is a staphylococcal (a type of germ) infection, or a fungal (Candida) infection. When caused by a germ, it usually comes on suddenly with redness, swelling, pus and is often painful. It may get under the nail and form an abscess (collection of pus), or form an abscess around the nail. If the nail itself is infected with a fungus, the treatment is usually prolonged and may require oral medicine for up to one year. Your caregiver will determine the length of time treatment is required. The paronychia caused by bacteria (germs) may largely be avoided by not pulling on hangnails or picking at cuticles. When the infection occurs at the tips of the finger it is called felon. When the cause of paronychia is from the herpes simplex virus (HSV) it is called herpetic whitlow. TREATMENT  When an abscess is present treatment is often incision and drainage. This means that the abscess must be cut open so the pus can get out. When this is done, the following home care instructions should be followed. HOME CARE INSTRUCTIONS   It is important to keep the affected fingers very dry. Rubber or plastic gloves over cotton gloves should be used whenever the hand must be placed in water.  Keep wound clean, dry and dressed as suggested by your caregiver between warm soaks or warm compresses.  Soak in warm water for fifteen to twenty minutes three to four times per day  for bacterial infections. Fungal infections are very difficult to treat, so often require treatment for long periods of time.  For bacterial (germ) infections take antibiotics (medicine which kill germs) as directed and finish the prescription, even if the problem appears to be solved before the medicine is gone.  Only take over-the-counter or prescription medicines for pain, discomfort, or fever as directed by your caregiver. SEEK IMMEDIATE MEDICAL CARE IF:  You have redness, swelling, or increasing pain in the wound.  You notice pus coming from the wound.  You have a fever.  You notice a bad smell coming from the wound or dressing. Document Released: 12/19/2000 Document Revised: 09/17/2011 Document Reviewed: 08/20/2008 [facility] Patient Information 2015 [place], [place]. This information is not intended to replace advice given to you by your health care provider. Make sure you discuss any questions you have with your health care provider.

## 2014-10-30 NOTE — ED Notes (Signed)
C/o left ring finger infection States she was given antibiotic a week ago Use warm water soaks as tx States finger has pain radiating down

## 2014-10-30 NOTE — ED Provider Notes (Signed)
Rebecca Mullins is a 39 y.o. female who presents to Urgent Care today for paronychia. Patient was seen a few weeks ago for paronychia of her left fourth digit. She was treated with clindamycin which seemed to help. She notes her symptoms are mostly improved however she notes stinging burning pain on the palmar aspect of her distal finger as well as hyperpigmentation of her skin. She denies any radiating pain weakness or numbness. She has tried warm water soaks which helped.   Past Medical History  Diagnosis Date  . Cellulitis   . Anemia   . Strep throat    Past Surgical History  Procedure Laterality Date  . No past surgeries     History  Substance Use Topics  . Smoking status: Never Smoker   . Smokeless tobacco: Not on file  . Alcohol Use: 1.2 oz/week    2 Glasses of wine per week     Comment: occsionally   ROS as above Medications: No current facility-administered medications for this encounter.   Current Outpatient Prescriptions  Medication Sig Dispense Refill  . azithromycin (ZITHROMAX) 250 MG tablet Take 250 mg by mouth daily.    . clindamycin (CLEOCIN) 150 MG capsule Take 1 capsule (150 mg total) by mouth 3 (three) times daily. 30 capsule 0  . HYDROcodone-acetaminophen (NORCO) 5-325 MG per tablet Take 1 tablet by mouth at bedtime as needed for moderate pain. 12 tablet 0  . methocarbamol (ROBAXIN) 500 MG tablet Take 1 tablet (500 mg total) by mouth at bedtime. 10 tablet 1  . sulfamethoxazole-trimethoprim (BACTRIM DS,SEPTRA DS) 800-160 MG per tablet Take 2 tablets by mouth 2 (two) times daily. 28 tablet 0   Allergies  Allergen Reactions  . Penicillins      Exam:  BP 154/99 mmHg  Pulse 96  Temp(Src) 97.1 F (36.2 C) (Oral)  Resp 16  SpO2 98%  LMP 10/08/2014  Gen: Well NAD HEENT: EOMI,  MMM Lungs: Normal work of breathing. CTABL Heart: RRR no MRG Abd: NABS, Soft. Nondistended, Nontender Exts: Brisk capillary refill, warm and well perfused.  Left hand. Normal at  the exception of the fourth distal digit. The ulnar side of her digit distally at the nail shows thickened excoriated skin with hyperpigmentation and tenderness. No fluctuance palpated. Motion and capillary refill are intact  No results found for this or any previous visit (from the past 24 hour(s)). No results found.  Assessment and Plan: 39 y.o. female with paronychia chronic infection possibly. Treat with Bactrim follow-up with hand surgery or PCP.  Discussed warning signs or symptoms. Please see discharge instructions. Patient expresses understanding.     Gregor Hams, MD 10/30/14 [phone]

## 2015-01-13 ENCOUNTER — Inpatient Hospital Stay (HOSPITAL_COMMUNITY): Payer: No Typology Code available for payment source

## 2015-01-13 ENCOUNTER — Inpatient Hospital Stay (HOSPITAL_COMMUNITY)
Admission: AD | Admit: 2015-01-13 | Discharge: 2015-01-13 | Disposition: A | Discharge status: Home / Self Care | Payer: No Typology Code available for payment source | Source: Ambulatory Visit | Attending: Family Medicine | Admitting: Family Medicine

## 2015-01-13 ENCOUNTER — Encounter (HOSPITAL_COMMUNITY): Payer: Self-pay | Admitting: *Deleted

## 2015-01-13 DIAGNOSIS — R809 Proteinuria, unspecified: Secondary | ICD-10-CM

## 2015-01-13 DIAGNOSIS — O1210 Gestational proteinuria, unspecified trimester: Secondary | ICD-10-CM | POA: Insufficient documentation

## 2015-01-13 DIAGNOSIS — O200 Threatened abortion: Secondary | ICD-10-CM | POA: Diagnosis not present

## 2015-01-13 DIAGNOSIS — O209 Hemorrhage in early pregnancy, unspecified: Secondary | ICD-10-CM

## 2015-01-13 HISTORY — DX: Hypothyroidism, unspecified: E03.9

## 2015-01-13 LAB — URINALYSIS, ROUTINE W REFLEX MICROSCOPIC
Bilirubin Urine: NEGATIVE
GLUCOSE, UA: NEGATIVE mg/dL
Ketones, ur: NEGATIVE mg/dL
Leukocytes, UA: NEGATIVE
Nitrite: NEGATIVE
Urobilinogen, UA: 0.2 mg/dL (ref 0.0–1.0)
pH: 5.5 (ref 5.0–8.0)

## 2015-01-13 LAB — CBC
HCT: 35.8 % — ABNORMAL LOW (ref 36.0–46.0)
Hemoglobin: 11.7 g/dL — ABNORMAL LOW (ref 12.0–15.0)
MCH: 27.7 pg (ref 26.0–34.0)
MCHC: 32.7 g/dL (ref 30.0–36.0)
MCV: 84.6 fL (ref 78.0–100.0)
PLATELETS: 374 10*3/uL (ref 150–400)
RBC: 4.23 MIL/uL (ref 3.87–5.11)
RDW: 14.3 % (ref 11.5–15.5)
WBC: 9.3 10*3/uL (ref 4.0–10.5)

## 2015-01-13 LAB — URINE MICROSCOPIC-ADD ON

## 2015-01-13 LAB — HCG, QUANTITATIVE, PREGNANCY: hCG, Beta Chain, Quant, S: 1639 m[IU]/mL — ABNORMAL HIGH (ref <5)

## 2015-01-13 LAB — ABO/RH: ABO/RH(D): A POS

## 2015-01-13 NOTE — MAU Provider Note (Signed)
History     CSN: [id number]  Arrival date and time: 01/13/15 1531   None     Chief Complaint  Patient presents with  . Vaginal Bleeding  . Leg Swelling   HPI Rebecca Mullins 39 y.o. G1P0 @ unknown gestational age presents for vaginal bleeding and swelling in legs.   Went to the dr for a cold- on 6/9- found out preg. Has not seen anyone yet for preg. Started bleeding on Friday - very heavy x several days and now lighter.   Legs have been swelling and improving some over several months.  Had doppler study on legs in June, no clots.   Has knee pain.  Denies fever, weakness, abdominal pain, headache.  Is overdue for her bp meds today.  Has h/o PCOS. Past Medical History  Diagnosis Date  . Cellulitis   . Anemia   . Strep throat   . Hypothyroidism     Past Surgical History  Procedure Laterality Date  . No past surgeries      Family History  Problem Relation Age of Onset  . Diabetes Mother   . Cancer Mother   . Hyperlipidemia Father   . Diabetes Brother     History  Substance Use Topics  . Smoking status: Never Smoker   . Smokeless tobacco: Never Used  . Alcohol Use: 1.2 oz/week    2 Glasses of wine per week     Comment: occsionally    Allergies:  Allergies  Allergen Reactions  . Penicillins     Prescriptions prior to admission  Medication Sig Dispense Refill Last Dose  . azithromycin (ZITHROMAX) 250 MG tablet Take 250 mg by mouth daily.   Taking  . clindamycin (CLEOCIN) 150 MG capsule Take 1 capsule (150 mg total) by mouth 3 (three) times daily. 30 capsule 0   . HYDROcodone-acetaminophen (NORCO) 5-325 MG per tablet Take 1 tablet by mouth at bedtime as needed for moderate pain. 12 tablet 0   . methocarbamol (ROBAXIN) 500 MG tablet Take 1 tablet (500 mg total) by mouth at bedtime. 10 tablet 1   . sulfamethoxazole-trimethoprim (BACTRIM DS,SEPTRA DS) 800-160 MG per tablet Take 2 tablets by mouth 2 (two) times daily. 28 tablet 0     ROS Pertinent ROS in HPI.  All  other systems are negative.   Physical Exam   Blood pressure 165/81, pulse 92, temperature 98.4 F (36.9 C), temperature source Oral, resp. rate 18, height 5\' 3"  (1.6 m), weight 264 lb (119.75 kg), last menstrual period 11/07/2014.  Physical Exam  Constitutional: She is oriented to person, place, and time. She appears well-developed and well-nourished. No distress.  HENT:  Head: Normocephalic and atraumatic.  Eyes: EOM are normal.  Neck: Normal range of motion.  Cardiovascular: Normal rate.   Significant LE edema bilat  Respiratory: Effort normal. No respiratory distress.  GI: Soft. Bowel sounds are normal. She exhibits no distension. There is no tenderness.  Musculoskeletal: Normal range of motion.  Neurological: She is alert and oriented to person, place, and time.  Skin: Skin is warm and dry.  Significant facial hair.   Bilat legs with hyperpigmentation of medial lower portion.    Psychiatric: She has a normal mood and affect.    MAU Course  Procedures  MDM Results for orders placed or performed during the hospital encounter of 01/13/15 (from the past 72 hour(s))  CBC     Status: Abnormal   Collection Time: 01/13/15  4:25 PM  Result Value Ref Range  WBC 9.3 4.0 - 10.5 K/uL   RBC 4.23 3.87 - 5.11 MIL/uL   Hemoglobin 11.7 (L) 12.0 - 15.0 g/dL   HCT 35.8 (L) 36.0 - 46.0 %   MCV 84.6 78.0 - 100.0 fL   MCH 27.7 26.0 - 34.0 pg   MCHC 32.7 30.0 - 36.0 g/dL   RDW 14.3 11.5 - 15.5 %   Platelets 374 150 - 400 K/uL  hCG, quantitative, pregnancy     Status: Abnormal   Collection Time: 01/13/15  4:25 PM  Result Value Ref Range   hCG, Beta Chain, Quant, S 1639 (H) <5 mIU/mL    Comment:          GEST. AGE      CONC.  (mIU/mL)   <=1 WEEK        5 - 50     2 WEEKS       50 - 500     3 WEEKS       100 - 10,000     4 WEEKS     1,000 - 30,000     5 WEEKS     3,500 - 115,000   6-8 WEEKS     12,000 - 270,000    12 WEEKS     15,000 - 220,000        FEMALE AND NON-PREGNANT  FEMALE:     LESS THAN 5 mIU/mL   ABO/Rh     Status: None   Collection Time: 01/13/15  4:25 PM  Result Value Ref Range   ABO/RH(D) A POS   Urinalysis, Routine w reflex microscopic (not at [facility])     Status: Abnormal   Collection Time: 01/13/15  6:00 PM  Result Value Ref Range   Color, Urine YELLOW YELLOW   APPearance CLEAR CLEAR   Specific Gravity, Urine >1.030 (H) 1.005 - 1.030   pH 5.5 5.0 - 8.0   Glucose, UA NEGATIVE NEGATIVE mg/dL   Hgb urine dipstick LARGE (A) NEGATIVE   Bilirubin Urine NEGATIVE NEGATIVE   Ketones, ur NEGATIVE NEGATIVE mg/dL   Protein, ur >300 (A) NEGATIVE mg/dL   Urobilinogen, UA 0.2 0.0 - 1.0 mg/dL   Nitrite NEGATIVE NEGATIVE   Leukocytes, UA NEGATIVE NEGATIVE  Urine microscopic-add on     Status: None   Collection Time: 01/13/15  6:00 PM  Result Value Ref Range   Squamous Epithelial / LPF RARE RARE   WBC, UA 0-2 <3 WBC/hpf   RBC / HPF 7-10 <3 RBC/hpf   Bacteria, UA RARE RARE   US Ob Comp Less 14 Wks  01/13/2015   CLINICAL DATA:  Acute onset of vaginal bleeding.  Initial encounter.  EXAM: OBSTETRIC <14 WK [place] AND TRANSVAGINAL OB US  TECHNIQUE: Both transabdominal and transvaginal ultrasound examinations were performed for complete evaluation of the gestation as well as the maternal uterus, adnexal regions, and pelvic cul-de-sac. Transvaginal technique was performed to assess early pregnancy.  COMPARISON:  Pelvic ultrasound performed 12/07/2009  FINDINGS: Intrauterine gestational sac: None seen.  Yolk sac:  N/A  Embryo:  N/A  Maternal uterus/adnexae: No subchorionic hemorrhage is noted. The uterus is unremarkable in appearance  The ovaries are within normal limits. The right ovary measures 3.8 x 2.1 x 2.5 cm, while the left ovary measures 3.3 x 1.8 x 2.2 cm. No suspicious adnexal masses are seen; there is no evidence for ovarian torsion.  No free fluid is seen within the pelvic cul-de-sac.  IMPRESSION: No intrauterine gestational sac is seen at this  time. No evidence  of ectopic pregnancy. Given the patient's quantitative beta HCG level of 1,639, this is thought to reflect recent spontaneous abortion. If the patient's quantitative beta HCG continues to trend upward, follow-up pelvic ultrasound could be considered in 2 weeks.   Electronically Signed   By: Garald Balding M.D.   On: 01/13/2015 19:19   US Ob Transvaginal  01/13/2015   CLINICAL DATA:  Acute onset of vaginal bleeding.  Initial encounter.  EXAM: OBSTETRIC <14 WK [place] AND TRANSVAGINAL OB US  TECHNIQUE: Both transabdominal and transvaginal ultrasound examinations were performed for complete evaluation of the gestation as well as the maternal uterus, adnexal regions, and pelvic cul-de-sac. Transvaginal technique was performed to assess early pregnancy.  COMPARISON:  Pelvic ultrasound performed 12/07/2009  FINDINGS: Intrauterine gestational sac: None seen.  Yolk sac:  N/A  Embryo:  N/A  Maternal uterus/adnexae: No subchorionic hemorrhage is noted. The uterus is unremarkable in appearance  The ovaries are within normal limits. The right ovary measures 3.8 x 2.1 x 2.5 cm, while the left ovary measures 3.3 x 1.8 x 2.2 cm. No suspicious adnexal masses are seen; there is no evidence for ovarian torsion.  No free fluid is seen within the pelvic cul-de-sac.  IMPRESSION: No intrauterine gestational sac is seen at this time. No evidence of ectopic pregnancy. Given the patient's quantitative beta HCG level of 1,639, this is thought to reflect recent spontaneous abortion. If the patient's quantitative beta HCG continues to trend upward, follow-up pelvic ultrasound could be considered in 2 weeks.   Electronically Signed   By: Garald Balding M.D.   On: 01/13/2015 19:19   Assessment and Plan  A:  1. Threatened miscarriage in early pregnancy   2. Vaginal bleeding in pregnancy, first trimester   3. Proteinuria    P: Discharge to home Return to MAU in 48 hours for quant HCG  Pelvic rest Take BP meds asap F/u with PCP asap as  there are multiple chronic medical concerns that appear to be unaddressed.    LINEBERRY,SUSAN 01/13/2015, 4:04 PM

## 2015-01-13 NOTE — MAU Note (Addendum)
Went to the dr for a cold- on 6/9- found out preg.  Had doppler study on legs in June, no clots. Has not seen anyone yet for preg. Started bleeding on Friday. Bleeding continues. Legs started swelling on Friday.  Has gone down some, but still swollen.  Knees have been hurting

## 2015-01-13 NOTE — Discharge Instructions (Signed)
Threatened Miscarriage A threatened miscarriage occurs when you have vaginal bleeding during your first 20 weeks of pregnancy but the pregnancy has not ended. If you have vaginal bleeding during this time, your health care provider will do tests to make sure you are still pregnant. If the tests show you are still pregnant and the developing baby (fetus) inside your womb (uterus) is still growing, your condition is considered a threatened miscarriage. A threatened miscarriage does not mean your pregnancy will end, but it does increase the risk of losing your pregnancy (complete miscarriage). CAUSES  The cause of a threatened miscarriage is usually not known. If you go on to have a complete miscarriage, the most common cause is an abnormal number of chromosomes in the developing baby. Chromosomes are the structures inside cells that hold all your genetic material. Some causes of vaginal bleeding that do not result in miscarriage include:  Having sex.  Having an infection.  Normal hormone changes of pregnancy.  Bleeding that occurs when an egg implants in your uterus. RISK FACTORS Risk factors for bleeding in early pregnancy include:  Obesity.  Smoking.  Drinking excessive amounts of alcohol or caffeine.  Recreational drug use. SIGNS AND SYMPTOMS  Light vaginal bleeding.  Mild abdominal pain or cramps. DIAGNOSIS  If you have bleeding with or without abdominal pain before 20 weeks of pregnancy, your health care provider will do tests to check whether you are still pregnant. One important test involves using sound waves and a computer (ultrasound) to create images of the inside of your uterus. Other tests include an internal exam of your vagina and uterus (pelvic exam) and measurement of your baby's heart rate.  You may be diagnosed with a threatened miscarriage if:  Ultrasound testing shows you are still pregnant.  Your baby's heart rate is strong.  A pelvic exam shows that the  opening between your uterus and your vagina (cervix) is closed.  Your heart rate and blood pressure are stable.  Blood tests confirm you are still pregnant. TREATMENT  No treatments have been shown to prevent a threatened miscarriage from going on to a complete miscarriage. However, the right home care is important.  HOME CARE INSTRUCTIONS   Make sure you keep all your appointments for prenatal care. This is very important.  Get plenty of rest.  Do not have sex or use tampons if you have vaginal bleeding.  Do not douche.  Do not smoke or use recreational drugs.  Do not drink alcohol.  Avoid caffeine. SEEK MEDICAL CARE IF:  You have light vaginal bleeding or spotting while pregnant.  You have abdominal pain or cramping.  You have a fever. SEEK IMMEDIATE MEDICAL CARE IF:  You have heavy vaginal bleeding.  You have blood clots coming from your vagina.  You have severe low back pain or abdominal cramps.  You have fever, chills, and severe abdominal pain. MAKE SURE YOU:  Understand these instructions.  Will watch your condition.  Will get help right away if you are not doing well or get worse. Document Released: 06/25/2005 Document Revised: 06/30/2013 Document Reviewed: 04/21/2013 [facility] Patient Information 2015 [place], [place]. This information is not intended to replace advice given to you by your health care provider. Make sure you discuss any questions you have with your health care provider.  Pelvic Rest Pelvic rest is sometimes recommended for women when:   The placenta is partially or completely covering the opening of the cervix (placenta previa).  There is bleeding between the  uterine wall and the amniotic sac in the first trimester (subchorionic hemorrhage).  The cervix begins to open without labor starting (incompetent cervix, cervical insufficiency).  The labor is too early (preterm labor). HOME CARE INSTRUCTIONS  Do not have sexual intercourse,  stimulation, or an orgasm.  Do not use tampons, douche, or put anything in the vagina.  Do not lift anything over 10 pounds (4.5 kg).  Avoid strenuous activity or straining your pelvic muscles. SEEK MEDICAL CARE IF:  You have any vaginal bleeding during pregnancy. Treat this as a potential emergency.  You have cramping pain felt low in the stomach (stronger than menstrual cramps).  You notice vaginal discharge (watery, mucus, or bloody).  You have a low, dull backache.  There are regular contractions or uterine tightening. SEEK IMMEDIATE MEDICAL CARE IF: You have vaginal bleeding and have placenta previa.  Document Released: 10/20/2010 Document Revised: 09/17/2011 Document Reviewed: 10/20/2010 [facility] Patient Information 2015 [place], [place]. This information is not intended to replace advice given to you by your health care provider. Make sure you discuss any questions you have with your health care provider.

## 2015-02-10 ENCOUNTER — Other Ambulatory Visit: Payer: Self-pay | Admitting: Nurse Practitioner

## 2015-02-10 DIAGNOSIS — I872 Venous insufficiency (chronic) (peripheral): Secondary | ICD-10-CM

## 2015-03-01 ENCOUNTER — Ambulatory Visit
Admission: RE | Admit: 2015-03-01 | Discharge: 2015-03-01 | Disposition: A | Discharge status: Home / Self Care | Payer: No Typology Code available for payment source | Source: Ambulatory Visit | Attending: Nurse Practitioner | Admitting: Nurse Practitioner

## 2015-03-01 DIAGNOSIS — I872 Venous insufficiency (chronic) (peripheral): Secondary | ICD-10-CM

## 2015-03-01 DIAGNOSIS — I890 Lymphedema, not elsewhere classified: Secondary | ICD-10-CM | POA: Insufficient documentation

## 2015-03-01 DIAGNOSIS — I8310 Varicose veins of unspecified lower extremity with inflammation: Secondary | ICD-10-CM | POA: Insufficient documentation

## 2015-03-01 NOTE — Consult Note (Signed)
Chief Complaint:  I have leg swelling and leg pain.   Referring Physician(s): Allison,Bridgette  History of Present Illness: Rebecca Mullins is a 39 y.o. female referred today for evaluation of bilateral lower extremity pain and swelling, and the concern for chronic venous insufficiency as the etiology.  She reports that she first had the diagnosis of a lower extremity cellulitis during hospitalization at [facility] in 2013 for which she received anti-biotics. Subsequently her obstetrician Dr. Ruthann Cancer recognized ongoing skin changes and referred her to dermatology in May 2016. A recent appointment in July 2016 for cold symptoms revealed not only an unexpected pregnancy, but also a diagnosis of hypertension and diabetes mellitus. Since then she has unfortunately lost the pregnancy, and was also referred to Dr. Einar Gip of cardiology to evaluate her hypertension.  She reports bilateral lower extremity symmetric swelling, fatigue, and tired legs, particularly at the end of the day. She also describes a restless legs symptom.  In addition, she describes a pins and needles sensation of pain in the bilateral legs, symmetric.  She works as a Psychologist, counselling, and describes that her symptoms in her legs is worst at the end of the day. She does describe improvement in the swelling after elevating her legs, and in the morning after laying recumbent while sleeping.  She denies any knowledge of a prior DVT. She denies any perineal or vulvar varicosities. She has not carried a pregnancy to term and has no children. She denies any symptoms of abdominal or pelvic pain.  During her appointment with Dr. Einar Gip they addressed her weight, as well as her hypertension. She says that he has placed her on a diet modification for weight loss program.  Past Medical History  Diagnosis Date  . Cellulitis   . Anemia   . Strep throat   . Hypothyroidism     Past Surgical History  Procedure Laterality Date  . No  past surgeries      Allergies: Penicillins  Medications: Prior to Admission medications   Medication Sig Start Date End Date Taking? Authorizing Provider  furosemide (LASIX) 20 MG tablet Take 20 mg by mouth daily.    Historical Provider, MD  labetalol (NORMODYNE) 100 MG tablet Take 100 mg by mouth 2 (two) times daily.    Historical Provider, MD  levothyroxine (SYNTHROID, LEVOTHROID) 50 MCG tablet Take 50 mcg by mouth daily before breakfast.    Historical Provider, MD  loratadine (CLARITIN) 10 MG tablet Take 10 mg by mouth daily.    Historical Provider, MD  metFORMIN (GLUCOPHAGE) 500 MG tablet Take 1,000 mg by mouth at bedtime.    Historical Provider, MD  Prenat-FeFum-FePo-FA-Omega 3 (CONCEPT DHA) 53.5-38-1 MG CAPS Take 1 capsule by mouth daily.    Historical Provider, MD  spironolactone (ALDACTONE) 25 MG tablet Take 25 mg by mouth daily.    Historical Provider, MD     Family History  Problem Relation Age of Onset  . Diabetes Mother   . Cancer Mother   . Hyperlipidemia Father   . Diabetes Brother     Social History   Social History  . Marital Status: Single    Spouse Name: N/A  . Number of Children: N/A  . Years of Education: N/A   Social History Main Topics  . Smoking status: Former Smoker -- 1.00 packs/day for 2 years    Types: Cigarettes    Start date: 02/28/1993    Quit date: 03/01/1995  . Smokeless tobacco: Never Used  . Alcohol Use:  1.2 oz/week    2 Glasses of wine per week     Comment: occsionally  . Drug Use: No  . Sexual Activity: Yes    Birth Control/ Protection: None   Other Topics Concern  . Not on file   Social History Narrative     Review of Systems: A 12 point ROS discussed and pertinent positives are indicated in the HPI above.  All other systems are negative.  Review of Systems  Vital Signs: LMP 02/27/2015  Physical Exam  Atraumatic, normocephalic, mucous membranes moist and pink. Conjugate gaze. No icterus or scleral  injection. Mallampati 2. Full range of motion of the cervical region. Symmetric chest excursion on inspiration and expiration. No labored breathing. Abdomen soft nontender nondistended. No guarding or rigidity. Genitourinary deferred. Bilateral lower extremities demonstrate skin changes of the calf below the knee, worst on the left. She has "woody" changes compatible with lipodermatosclerosis. No open wounds or sores. No visible varicosities.  Mallampati Score:  2  Imaging: No results found.  Labs:  CBC:  Recent Labs  01/13/15 1625  WBC 9.3  HGB 11.7*  HCT 35.8*  PLT 374    COAGS: No results for input(s): INR, APTT in the last 8760 hours.  BMP: No results for input(s): NA, K, CL, CO2, GLUCOSE, BUN, CALCIUM, CREATININE, GFRNONAA, GFRAA in the last 8760 hours.  Invalid input(s): CMP  LIVER FUNCTION TESTS: No results for input(s): BILITOT, AST, ALT, ALKPHOS, PROT, ALBUMIN in the last 8760 hours.  TUMOR MARKERS: No results for input(s): AFPTM, CEA, CA199, CHROMGRNA in the last 8760 hours.  Assessment and Plan:  Ms. Mun is a 39 year old female presenting for evaluation of lower extremity swelling, pain, and skin changes with possible venous insufficiency as an etiology. A duplex examination completed today demonstrates no DVT, and no evidence of significant reflux.  The duplex demonstrates edema and a network of reticular veins of the bilateral calf region. On physical exam she has clear evidence of edema and chronic skin changes of lymphedema.  I had a long discussion with Mrs. Helmers regarding the natural history of venous insufficiency, including anatomy, pathology, pathophysiology. I discussed the results of her negative duplex examination, and the fact that her lower extremity changes are multifactorial. I described the reasons that I believe she is not a candidate for venous ablation. Namely, I do not feel that venous ablation will improve her symptoms.  I agree  with her weight loss program which she has recently initiated. I also would recommend 20-30 mmHg compression stockings, thigh-high, to be warm during waking hours while upright.  My plan is to see Ms. Bognar in 3 months to evaluate any improvement with these conservative measures. If at this time she has seen no improvement, we may consider MRV to evaluate for possible central venous obstruction.  I answered all of her questions to the best of my ability. She agrees with the plan of care.  Thank you for this interesting consult.  I greatly enjoyed meeting Fujie Dickison and look forward to participating in their care.  A copy of this report was sent to the requesting provider on this date.  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

## 2015-03-20 ENCOUNTER — Ambulatory Visit (INDEPENDENT_AMBULATORY_CARE_PROVIDER_SITE_OTHER): Payer: No Typology Code available for payment source | Admitting: Family Medicine

## 2015-03-20 VITALS — BP 130/82 | HR 68 | Temp 98.7°F | Resp 18 | Ht 64.0 in | Wt 256.0 lb

## 2015-03-20 DIAGNOSIS — Z30013 Encounter for initial prescription of injectable contraceptive: Secondary | ICD-10-CM | POA: Diagnosis not present

## 2015-03-20 DIAGNOSIS — R05 Cough: Secondary | ICD-10-CM

## 2015-03-20 DIAGNOSIS — R053 Chronic cough: Secondary | ICD-10-CM

## 2015-03-20 DIAGNOSIS — R300 Dysuria: Secondary | ICD-10-CM

## 2015-03-20 DIAGNOSIS — Z8742 Personal history of other diseases of the female genital tract: Secondary | ICD-10-CM

## 2015-03-20 DIAGNOSIS — Z8759 Personal history of other complications of pregnancy, childbirth and the puerperium: Secondary | ICD-10-CM

## 2015-03-20 LAB — POCT UA - MICROSCOPIC ONLY
BACTERIA, U MICROSCOPIC: POSITIVE
Crystals, Ur, HPF, POC: NEGATIVE
Mucus, UA: NEGATIVE
Yeast, UA: NEGATIVE

## 2015-03-20 LAB — POCT URINALYSIS DIPSTICK
Bilirubin, UA: NEGATIVE
GLUCOSE UA: NEGATIVE
Ketones, UA: NEGATIVE
LEUKOCYTES UA: NEGATIVE
NITRITE UA: NEGATIVE
Protein, UA: >=300
Spec Grav, UA: 1.025
Urobilinogen, UA: 0.2
pH, UA: 5.5

## 2015-03-20 LAB — POCT WET PREP WITH KOH
KOH Prep POC: NEGATIVE
Trichomonas, UA: NEGATIVE
YEAST WET PREP PER HPF POC: NEGATIVE

## 2015-03-20 LAB — POCT URINE PREGNANCY: PREG TEST UR: NEGATIVE

## 2015-03-20 MED ORDER — MEDROXYPROGESTERONE ACETATE 150 MG/ML IM SUSP
150.0000 mg | Freq: Once | INTRAMUSCULAR | Status: AC
  Administered 2015-03-20: 150 mg via INTRAMUSCULAR

## 2015-03-20 MED ORDER — NITROFURANTOIN MONOHYD MACRO 100 MG PO CAPS: 100.0000 mg | ORAL_CAPSULE | Freq: Two times a day (BID) | ORAL | Status: DC

## 2015-03-20 MED ORDER — BENZONATATE 100 MG PO CAPS
100.0000 mg | ORAL_CAPSULE | Freq: Three times a day (TID) | ORAL | Status: DC | PRN
Start: 2015-03-20 — End: 2015-04-21

## 2015-03-20 NOTE — Patient Instructions (Signed)
You got your depo shot today- do not trust this for contraception for about 2 weeks.  At the end of the 2 weeks take a home pregnancy test just in case. Assuming it is negative we will continue your depo treatment  We are going to use macrobid for your urinary tract infection- take it twice a day for 7 days Use the tessalon perles as needed for cough

## 2015-03-20 NOTE — Progress Notes (Addendum)
[facility] [address] [place] [address] [phone]  Date:  03/20/2015   Name:  Rebecca Mullins   DOB:  13-Feb-1976   MRN:  [id number]  PCP:  No PCP Per Patient    Chief Complaint: Recurrent Skin Infections; depo provera; Cough; Laryngitis; and burning with urination   History of Present Illness:  Rebecca Mullins is a 39 y.o. very pleasant female patient who presents with the following:  Here today with a few concerns.    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  She has had some evaluation including a recent LW doppler and echo per her report; these were normal.  She is not ill with fever, but has had some laryngitis.  Cough is dry  She recently lost a pregnancy via miscarriage in July.  She was seen at [facility] in July, and reports that she then followed up with her OBG Dr. Ruthann Cancer who followed her HCG until negative.  She had been on sprintec but this caused her legs to swell so he rx depo-provera for her.  She picked it up at the pharmacy and brought with her.   She has not been on depo in the past.  Last had intercourse 4 days ago but did take plan B that day.  She understands that there is a small risk of current pregnancy, but that hormones would not harm potential fetus,  She would like to go ahead with Depo today  She has also noted some dysuria for 2 days.  Possible UTI.  However she notes that she has "a boil" as well inside her vagina which could also be causing her pain  LMP 8/21  Patient Active Problem List   Diagnosis Date Noted  . Venous insufficiency of both lower extremities   . Lymphedema   . Lipodermatosclerosis   . Edema 01/19/2013  . Cellulitis 01/19/2013    Past Medical History  Diagnosis Date  . Cellulitis   . Anemia   . Strep throat   . Hypothyroidism     Past Surgical History  Procedure Laterality Date  . No past surgeries       Social History  Substance Use Topics  . Smoking status: Former Smoker -- 1.00 packs/day for 2 years    Types: Cigarettes    Start date: 02/28/1993    Quit date: 03/01/1995  . Smokeless tobacco: Never Used  . Alcohol Use: 1.2 oz/week    2 Glasses of wine per week     Comment: occsionally    Family History  Problem Relation Age of Onset  . Diabetes Mother   . Cancer Mother   . Hyperlipidemia Father   . Diabetes Brother     Allergies  Allergen Reactions  . Penicillins Anaphylaxis    Medication list has been reviewed and updated.  Current Outpatient Prescriptions on File Prior to Visit  Medication Sig Dispense Refill  . labetalol (NORMODYNE) 100 MG tablet Take 100 mg by mouth 2 (two) times daily.    Marland Kitchen levothyroxine (SYNTHROID, LEVOTHROID) 50 MCG tablet Take 50 mcg by mouth daily before breakfast.    . loratadine (CLARITIN) 10 MG tablet Take 10 mg by mouth daily.    . metFORMIN (GLUCOPHAGE) 500 MG tablet Take 1,000 mg by mouth at bedtime.    . Prenat-FeFum-FePo-FA-Omega 3 (CONCEPT DHA) 53.5-38-1 MG CAPS Take 1 capsule by mouth daily.    Marland Kitchen  spironolactone (ALDACTONE) 25 MG tablet Take 25 mg by mouth daily.    . furosemide (LASIX) 20 MG tablet Take 20 mg by mouth daily.     No current facility-administered medications on file prior to visit.    Review of Systems:  As per HPI- otherwise negative.   Physical Examination: Filed Vitals:   03/20/15 1354  BP: 130/82  Pulse: 68  Temp: 98.7 F (37.1 C)  Resp: 18   Filed Vitals:   03/20/15 1354  Height: 5\' 4"  (1.626 m)  Weight: 256 lb (116.121 kg)   Body mass index is 43.92 kg/(m^2). Ideal Body Weight: Weight in (lb) to have BMI = 25: 145.3  GEN: WDWN, NAD, Non-toxic, A & O x 3, obese, looks well HEENT: Atraumatic, Normocephalic. Neck supple. No masses, No LAD. Ears and Nose: No external deformity. CV: RRR, No M/G/R. No JVD. No thrill. No extra heart sounds. PULM: CTA B, no wheezes, crackles, rhonchi. No  retractions. No resp. distress. No accessory muscle use. ABD: S, NT, ND. No rebound. No HSM. EXTR: No c/c.  She has chronic appearing edema of both LE - she states that this is long standing NEURO Normal gait.  PSYCH: Normally interactive. Conversant. Not depressed or anxious appearing.  Calm demeanor.  Pelvic: normal, no vaginal lesions or discharge. Uterus normal, no CMT, no adnexal tendereness or masses No appreciable vaginal abnl, did not appreciate any vaginal bleeding    Results for orders placed or performed in visit on 03/20/15  POCT urinalysis dipstick  Result Value Ref Range   Color, UA yellow    Clarity, UA clear    Glucose, UA neg    Bilirubin, UA neg    Ketones, UA neg    Spec Grav, UA 1.025    Blood, UA large    pH, UA 5.5    Protein, UA >=300    Urobilinogen, UA 0.2    Nitrite, UA neg    Leukocytes, UA Negative Negative  POCT UA - Microscopic Only  Result Value Ref Range   WBC, Ur, HPF, POC 6-8    RBC, urine, microscopic 10-12    Bacteria, U Microscopic positive    Mucus, UA neg    Epithelial cells, urine per micros 0-2    Crystals, Ur, HPF, POC neg    Casts, Ur, LPF, POC hyaline casts    Yeast, UA neg   POCT urine pregnancy  Result Value Ref Range   Preg Test, Ur Negative Negative  POCT Wet Prep with KOH  Result Value Ref Range   Trichomonas, UA Negative    Clue Cells Wet Prep HPF POC 0-2    Epithelial Wet Prep HPF POC Many Few, Moderate, Many   Yeast Wet Prep HPF POC neg    Bacteria Wet Prep HPF POC Few None, Few   RBC Wet Prep HPF POC 0-2    WBC Wet Prep HPF POC 3-5    KOH Prep POC Negative     Assessment and Plan: Burning with urination - Plan: POCT urinalysis dipstick, POCT UA - Microscopic Only, Urine culture, POCT Wet Prep with KOH, nitrofurantoin, macrocrystal-monohydrate, (MACROBID) 100 MG capsule  Miscarriage within last 12 months - Plan: POCT urine pregnancy  Encounter for initial prescription of injectable contraceptive - Plan:  medroxyPROGESTERone (DEPO-PROVERA) injection 150 mg  Persistent cough - Plan: benzonatate (TESSALON) 100 MG capsule  I do not see any lesion on her vagina- suspect her sx are due to UTI.  Will treat with macrobid but  asked her to let us know if this is not improved soon.  Culture pending Negative HCG today- she would like to go ahead with depo provera.  Given the injection that she brought with her today She has been coughing for some time.  Treat with tessalon perles today, but asked her to come back for further evaluation of this issue in a couple of weeks.  She currently also has laryngitis which suggests viral process   Signed Lamar Blinks, MD  Received her urine culture- it is negative. Called her- she did not get the macrobid yet as the pharmacy was out of it.  Advised her that she does not need it.  Her urinary sx do seem better. Discussed microhematuria- she states that she started having vaginal spotting, so this is the likely cause.  She will come and see me soon if cough does not go away

## 2015-03-21 LAB — URINE CULTURE
Colony Count: NO GROWTH
Organism ID, Bacteria: NO GROWTH

## 2015-03-23 ENCOUNTER — Encounter: Payer: Self-pay | Admitting: Family Medicine

## 2015-04-21 ENCOUNTER — Ambulatory Visit (INDEPENDENT_AMBULATORY_CARE_PROVIDER_SITE_OTHER): Payer: No Typology Code available for payment source

## 2015-04-21 ENCOUNTER — Ambulatory Visit (INDEPENDENT_AMBULATORY_CARE_PROVIDER_SITE_OTHER): Payer: No Typology Code available for payment source | Admitting: Family Medicine

## 2015-04-21 VITALS — BP 140/80 | HR 88 | Temp 98.6°F | Resp 20 | Ht 64.5 in | Wt 258.4 lb

## 2015-04-21 DIAGNOSIS — R05 Cough: Secondary | ICD-10-CM

## 2015-04-21 DIAGNOSIS — K219 Gastro-esophageal reflux disease without esophagitis: Secondary | ICD-10-CM | POA: Diagnosis not present

## 2015-04-21 DIAGNOSIS — R3129 Other microscopic hematuria: Secondary | ICD-10-CM | POA: Diagnosis not present

## 2015-04-21 DIAGNOSIS — R053 Chronic cough: Secondary | ICD-10-CM

## 2015-04-21 LAB — POCT URINALYSIS DIP (MANUAL ENTRY)
BILIRUBIN UA: NEGATIVE
Glucose, UA: NEGATIVE
Ketones, POC UA: NEGATIVE
Leukocytes, UA: NEGATIVE
Nitrite, UA: NEGATIVE
PH UA: 5.5
Protein Ur, POC: >=300 — AB
Spec Grav, UA: 1.025
Urobilinogen, UA: 0.2

## 2015-04-21 LAB — POCT URINE PREGNANCY: Preg Test, Ur: NEGATIVE

## 2015-04-21 MED ORDER — SUCRALFATE 1 G PO TABS
1.0000 g | ORAL_TABLET | Freq: Three times a day (TID) | ORAL | Status: DC
Start: 2015-04-21 — End: 2015-06-29

## 2015-04-21 MED ORDER — PANTOPRAZOLE SODIUM 40 MG PO TBEC: 40.0000 mg | DELAYED_RELEASE_TABLET | Freq: Every day | ORAL | Status: DC

## 2015-04-21 NOTE — Patient Instructions (Signed)
We are going to treat you for likely reflux (GERD) with protonix- one a day for about 2 months, and also carafate for 10 days Let me know if this does not help with your cough and I will be in touch with the radiologist read of your chest x-ray

## 2015-04-21 NOTE — Progress Notes (Signed)
[facility] [address], [place] [address] [phone]  Date:  04/21/2015   Name:  Rebecca Mullins   DOB:  05/22/1976   MRN:  [id number]  PCP:  No PCP Per Patient    Chief Complaint: Cough   History of Present Illness:  Rebecca Mullins is a 39 y.o. very pleasant female patient who presents with the following:  Here today with concern about a cough. I saw her last month with UTI-  She was coughing at that time as well.  She noted cough for several months and that she had undergone an echo and LE dopplers that were normal.    She notes that when she comes home from work and at night she has more cough. She also notes that now when she eats and then lays down she will regurgitate food.  She has moved her dinner to earlier in the evening No belly pain She is coughing up mucus- no blood  She is now on depo since her recent miscarriage.   Patient Active Problem List   Diagnosis Date Noted  . Venous insufficiency of both lower extremities   . Lymphedema   . Lipodermatosclerosis   . Edema 01/19/2013  . Cellulitis 01/19/2013    Past Medical History  Diagnosis Date  . Cellulitis   . Anemia   . Strep throat   . Hypothyroidism     Past Surgical History  Procedure Laterality Date  . No past surgeries      Social History  Substance Use Topics  . Smoking status: Former Smoker -- 1.00 packs/day for 2 years    Types: Cigarettes    Start date: 02/28/1993    Quit date: 03/01/1995  . Smokeless tobacco: Never Used  . Alcohol Use: 1.2 oz/week    2 Glasses of wine per week     Comment: occsionally    Family History  Problem Relation Age of Onset  . Diabetes Mother   . Cancer Mother   . Hyperlipidemia Father   . Diabetes Brother     Allergies  Allergen Reactions  . Penicillins Anaphylaxis    Medication list has been reviewed and updated.  Current Outpatient Prescriptions on File Prior to Visit  Medication Sig Dispense Refill  .  labetalol (NORMODYNE) 100 MG tablet Take 100 mg by mouth 2 (two) times daily.    Marland Kitchen levothyroxine (SYNTHROID, LEVOTHROID) 50 MCG tablet Take 50 mcg by mouth daily before breakfast.    . loratadine (CLARITIN) 10 MG tablet Take 10 mg by mouth daily.    . metFORMIN (GLUCOPHAGE) 500 MG tablet Take 1,000 mg by mouth at bedtime.    . Prenat-FeFum-FePo-FA-Omega 3 (CONCEPT DHA) 53.5-38-1 MG CAPS Take 1 capsule by mouth daily.    Marland Kitchen spironolactone (ALDACTONE) 25 MG tablet Take 25 mg by mouth daily.    . benzonatate (TESSALON) 100 MG capsule Take 1 capsule (100 mg total) by mouth 3 (three) times daily as needed for cough. (Patient not taking: Reported on 04/21/2015) 40 capsule 0  . furosemide (LASIX) 20 MG tablet Take 20 mg by mouth daily.    . nitrofurantoin, macrocrystal-monohydrate, (MACROBID) 100 MG capsule Take 1 capsule (100 mg total) by mouth 2 (two) times daily. (Patient not taking: Reported on 04/21/2015) 14 capsule 0   No current facility-administered medications on file prior to visit.    Review of Systems:  As per HPI- otherwise negative.   Physical Examination: Filed Vitals:   04/21/15 1730  BP:  140/80  Pulse: 88  Temp: 98.6 F (37 C)  Resp: 20   Filed Vitals:   04/21/15 1730  Height: 5' 4.5" (1.638 m)  Weight: 258 lb 6.4 oz (117.209 kg)   Body mass index is 43.69 kg/(m^2). Ideal Body Weight: Weight in (lb) to have BMI = 25: 147.6  GEN: WDWN, NAD, Non-toxic, A & O x 3, obese, looks well, hirsutism HEENT: Atraumatic, Normocephalic. Neck supple. No masses, No LAD.  Bilateral TM wnl, oropharynx normal.  PEERL,EOMI.   Ears and Nose: No external deformity. CV: RRR, No M/G/R. No JVD. No thrill. No extra heart sounds. PULM: CTA B, no wheezes, crackles, rhonchi. No retractions. No resp. distress. No accessory muscle use. EXTR: No c/c/e NEURO Normal gait.  PSYCH: Normally interactive. Conversant. Not depressed or anxious appearing.  Calm demeanor.   [facility] reading (PRIMARY) by  Dr.  Lorelei Pont. CXR:  NAD  CHEST 2 VIEW COMPARISON: 05/02/2014 and prior radiographs FINDINGS: The cardiomediastinal silhouette is unremarkable.  Mild peribronchial thickening is unchanged.  There is no evidence of focal airspace disease, pulmonary edema, suspicious pulmonary nodule/mass, pleural effusion, or pneumothorax. No acute bony abnormalities are identified.  A thoracic scoliosis is again noted.  IMPRESSION: No evidence of acute cardiopulmonary disease. Chronic peribronchial thickening.   Results for orders placed or performed in visit on 04/21/15  POCT urinalysis dipstick  Result Value Ref Range   Color, UA yellow yellow   Clarity, UA cloudy (A) clear   Glucose, UA negative negative   Bilirubin, UA negative negative   Ketones, POC UA negative negative   Spec Grav, UA 1.025    Blood, UA large (A) negative   pH, UA 5.5    Protein Ur, POC >=300 (A) negative   Urobilinogen, UA 0.2    Nitrite, UA Negative Negative   Leukocytes, UA Negative Negative  POCT urine pregnancy  Result Value Ref Range   Preg Test, Ur Negative Negative   Tried to repeat UA today but she noted that she was again having vaginal spotting   Assessment and Plan: Persistent cough - Plan: DG Chest 2 View  Gastroesophageal reflux disease, esophagitis presence not specified - Plan: pantoprazole (PROTONIX) 40 MG tablet, sucralfate (CARAFATE) 1 G tablet  Microhematuria - Plan: POCT urinalysis dipstick  Suspect that her cough is related to GERD given her sx above   Will try protonix and carafate Asked her to follow-up if not improved in a week or so  Signed Lamar Blinks, MD

## 2015-05-04 ENCOUNTER — Other Ambulatory Visit: Payer: Self-pay | Admitting: Interventional Radiology

## 2015-05-04 DIAGNOSIS — I872 Venous insufficiency (chronic) (peripheral): Secondary | ICD-10-CM

## 2015-06-25 ENCOUNTER — Ambulatory Visit (INDEPENDENT_AMBULATORY_CARE_PROVIDER_SITE_OTHER): Payer: No Typology Code available for payment source | Admitting: Physician Assistant

## 2015-06-25 VITALS — BP 132/78 | HR 86 | Temp 98.4°F | Resp 20 | Ht 65.0 in | Wt 253.0 lb

## 2015-06-25 DIAGNOSIS — Z3049 Encounter for surveillance of other contraceptives: Secondary | ICD-10-CM | POA: Diagnosis not present

## 2015-06-25 DIAGNOSIS — Z3042 Encounter for surveillance of injectable contraceptive: Secondary | ICD-10-CM

## 2015-06-25 LAB — POCT URINE PREGNANCY: Preg Test, Ur: NEGATIVE

## 2015-06-25 MED ORDER — MEDROXYPROGESTERONE ACETATE 150 MG/ML IM SUSP: 150.0000 mg | INTRAMUSCULAR | Status: DC

## 2015-06-25 MED ORDER — MEDROXYPROGESTERONE ACETATE 150 MG/ML IM SUSP
150.0000 mg | Freq: Once | INTRAMUSCULAR | Status: AC
  Administered 2015-06-25: 150 mg via INTRAMUSCULAR

## 2015-06-25 NOTE — Progress Notes (Signed)
[facility] [address] [place] [address] [phone]  Date:  06/25/2015   Name:  Rebecca Mullins   DOB:  1976/04/04   MRN:  [id number]  PCP:  No PCP Per Patient    Chief Complaint: Other   History of Present Illness:  This is a 39 y.o. female with PMH lymphedema and venous insufficiency who is presenting for depo injection. She had first injection 03/20/15. She is outside of her window by 6 days. She had some spotting initially but this has resolved. No weight gain - she has actually lost 3 pounds since then. She has noticed a decreased sex drive but this doesn't bother her. She has not been sexually active in the past couple weeks.  Review of Systems:  Review of Systems See HPI  Patient Active Problem List   Diagnosis Date Noted  . Venous insufficiency of both lower extremities   . Lymphedema   . Lipodermatosclerosis   . Edema 01/19/2013  . Cellulitis 01/19/2013    Prior to Admission medications   Medication Sig Start Date End Date Taking? Authorizing Provider  labetalol (NORMODYNE) 100 MG tablet Take 100 mg by mouth 2 (two) times daily.   Yes Historical Provider, MD  levothyroxine (SYNTHROID, LEVOTHROID) 50 MCG tablet Take 50 mcg by mouth daily before breakfast.   Yes Historical Provider, MD  loratadine (CLARITIN) 10 MG tablet Take 10 mg by mouth daily.   Yes Historical Provider, MD  metFORMIN (GLUCOPHAGE) 500 MG tablet Take 1,000 mg by mouth at bedtime.   Yes Historical Provider, MD  Prenat-FeFum-FePo-FA-Omega 3 (CONCEPT DHA) 53.5-38-1 MG CAPS Take 1 capsule by mouth daily.   Yes Historical Provider, MD  spironolactone (ALDACTONE) 25 MG tablet Take 25 mg by mouth daily.   Yes Historical Provider, MD  furosemide (LASIX) 20 MG tablet Take 20 mg by mouth daily. Reported on 06/25/2015    Historical Provider, MD  pantoprazole (PROTONIX) 40 MG tablet Take 1 tablet (40 mg total) by mouth daily. Patient not taking: Reported on 06/25/2015 04/21/15    Gay Filler Copland, MD  sucralfate (CARAFATE) 1 G tablet Take 1 tablet (1 g total) by mouth 4 (four) times daily -  with meals and at bedtime. Patient not taking: Reported on 06/25/2015 04/21/15   Darreld Mclean, MD    Allergies  Allergen Reactions  . Penicillins Anaphylaxis    Past Surgical History  Procedure Laterality Date  . No past surgeries      Social History  Substance Use Topics  . Smoking status: Former Smoker -- 1.00 packs/day for 2 years    Types: Cigarettes    Start date: 02/28/1993    Quit date: 03/01/1995  . Smokeless tobacco: Never Used  . Alcohol Use: 1.2 oz/week    2 Glasses of wine per week     Comment: occsionally    Family History  Problem Relation Age of Onset  . Diabetes Mother   . Cancer Mother   . Hyperlipidemia Father   . Diabetes Brother     Medication list has been reviewed and updated.  Physical Examination:  Physical Exam  Constitutional: She is oriented to person, place, and time. She appears well-developed and well-nourished. No distress.  HENT:  Head: Normocephalic and atraumatic.  Right Ear: Hearing normal.  Left Ear: Hearing normal.  Nose: Nose normal.  Eyes: Conjunctivae and lids are normal. Right eye exhibits no discharge. Left eye exhibits no discharge. No scleral icterus.  Pulmonary/Chest: Effort normal.  No respiratory distress.  Abdominal:  obese  Musculoskeletal: Normal range of motion.  Neurological: She is alert and oriented to person, place, and time.  Skin: Skin is warm, dry and intact. No lesion and no rash noted.  Psychiatric: She has a normal mood and affect. Her speech is normal and behavior is normal. Thought content normal.   BP 132/78 mmHg  Pulse 86  Temp(Src) 98.4 F (36.9 C) (Oral)  Resp 20  Ht 5\' 5"  (1.651 m)  Wt 253 lb (114.76 kg)  BMI 42.10 kg/m2  SpO2 98%  LMP 02/27/2015  Results for orders placed or performed in visit on 06/25/15  POCT urine pregnancy  Result Value Ref Range   Preg Test,  Ur Negative Negative    Assessment and Plan:  1. Depo contraception Urine pregnancy negative. Depo administered. Gave paper with her window to return. - POCT urine pregnancy - medroxyPROGESTERone (DEPO-PROVERA) injection 150 mg; Inject 1 mL (150 mg total) into the muscle once.   Benjaman Pott Drenda Freeze, MHS [facility]  06/25/2015

## 2015-06-25 NOTE — Patient Instructions (Signed)
Come back between march 4th and march 18th for depo shot.

## 2015-06-27 ENCOUNTER — Telehealth: Payer: Self-pay | Admitting: Internal Medicine

## 2015-06-27 NOTE — Telephone Encounter (Signed)
Spoke with Rebecca Mullins with Dr. Arlean Hopping office.  States pt was seen by Dr. Kathi Ludwig on 06/13/15 as a new pt.  Pt came back today for f/u.  SOB is worse.  Per Rebecca Mullins, Dr. Estanislado Pandy is very concerned about pt.  Requesting pt to be seen prior to appt in Feb.  Appt scheduled with MR on Wednesday, Dec 21 at [place] will inform pt and will ask pt to arrive 15 min early.  Rebecca Mullins very appreciative and voiced no further questions or concerns at this time.

## 2015-06-29 ENCOUNTER — Encounter: Payer: Self-pay | Admitting: Internal Medicine

## 2015-06-29 ENCOUNTER — Ambulatory Visit (INDEPENDENT_AMBULATORY_CARE_PROVIDER_SITE_OTHER): Payer: No Typology Code available for payment source | Admitting: Internal Medicine

## 2015-06-29 VITALS — BP 188/94

## 2015-06-29 DIAGNOSIS — L943 Sclerodactyly: Secondary | ICD-10-CM

## 2015-06-29 DIAGNOSIS — R0600 Dyspnea, unspecified: Secondary | ICD-10-CM | POA: Diagnosis not present

## 2015-06-29 DIAGNOSIS — R0689 Other abnormalities of breathing: Secondary | ICD-10-CM | POA: Diagnosis not present

## 2015-06-29 DIAGNOSIS — E669 Obesity, unspecified: Secondary | ICD-10-CM

## 2015-06-29 NOTE — Addendum Note (Signed)
Addended by: Maurice March on: 06/29/2015 03:17 PM   Modules accepted: Orders

## 2015-06-29 NOTE — Patient Instructions (Addendum)
ICD-9-CM ICD-10-CM   1. 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 Obesity 278.00 E66.9      Do high resolution CT scan of the chest prone and supine images to rule out interstitial lung disease Do full pulmonary function test Test ONO Dr Einar Gip to consider right heart cath - will discuss with him   Follow-up Return to see me or my nurse practitioner in the next 2-4 weeks after completion of the above  - based on resuls will likely need O2 start +/- sleep study

## 2015-06-29 NOTE — Progress Notes (Signed)
Subjective:    Patient ID: Rebecca Mullins, female    DOB: 26-Jan-1976, 39 y.o.   MRN: [id number]   PCP Pcp Not In System Referred by Dr Armanda Heritage   HPI Referred for dyspnea  39 year old morbidly obese f This is severe and uncontrolled or poorly controlled.hypertension.   This is associated with chronic severe venous stasis edema for the last 1 year. At the same time she's had insidious onset of shortness of breath that is progressively worsened. It is brought on by exertion climbing flight of stairs or walking to the car lot from our office. Relieved by rest. There is some associated chest tightness with this. In March 2016 she got pregnant and by June 2016 due to severe hypertension she had intrauterine death of her child. There is also some associated dry cough. With the shortness of breath. Symptoms are limiting her lifestyle significantly. Walking desaturation test 185 feet 3 laps on room air: She desaturated to 85% of the second lap.  Reviewing the outside referral notes by Dr. 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 ANA test was positive but ENA, double-stranded DNA, RNP, Smith, role and lower negative. On 06/13/2015 she's had additional analysis was SCL 70, C3, C4, rheumatoid factor, CCP, anticardiolipin, lupus anticoagulant, cryoglobulins, Anka, CK.  Review of the notes also show patient is been struggling with severe high blood pressure. In fact Dr D started her on amlodipine after talking to Dr. Einar Gip on 06/13/2015.  Now that is concern for autoimmune disease related or shortness of breath. High concern for interstitial lung disease I am concerned about  has a past medical history of Cellulitis; Anemia; Strep throat; and Hypothyroidism.   reports that she quit smoking about 20 years ago. Her smoking use included Cigarettes. She started smoking about 22 years ago. She has a 2 pack-year smoking history. She has never used smokeless  tobacco.  Past Surgical History  Procedure Laterality Date  . No past surgeries      Allergies  Allergen Reactions  . Penicillins Anaphylaxis     There is no immunization history on file for this patient.  Family History  Problem Relation Age of Onset  . Diabetes Mother   . Cancer Mother   . Hyperlipidemia Father   . Diabetes Brother      Current outpatient prescriptions:  .  furosemide (LASIX) 20 MG tablet, Take 20 mg by mouth daily. Reported on 06/25/2015, Disp: , Rfl:  .  labetalol (NORMODYNE) 100 MG tablet, Take 100 mg by mouth 2 (two) times daily., Disp: , Rfl:  .  levothyroxine (SYNTHROID, LEVOTHROID) 50 MCG tablet, Take 50 mcg by mouth daily before breakfast., Disp: , Rfl:  .  loratadine (CLARITIN) 10 MG tablet, Take 10 mg by mouth daily., Disp: , Rfl:  .  medroxyPROGESTERone (DEPO-PROVERA) 150 MG/ML injection, Inject 1 mL (150 mg total) into the muscle every 3 (three) months., Disp: 1 mL, Rfl: 1 .  metFORMIN (GLUCOPHAGE) 500 MG tablet, Take 1,000 mg by mouth at bedtime., Disp: , Rfl:  .  Prenat-FeFum-FePo-FA-Omega 3 (CONCEPT DHA) 53.5-38-1 MG CAPS, Take 1 capsule by mouth daily., Disp: , Rfl:  .  spironolactone (ALDACTONE) 25 MG tablet, Take 25 mg by mouth daily., Disp: , Rfl:     Review of Systems  Constitutional: Negative for fever and unexpected weight change.  HENT: Negative for congestion, dental problem, ear pain, nosebleeds, postnasal drip, rhinorrhea, sinus pressure, sneezing, sore throat and trouble swallowing.  Eyes: Negative for redness and itching.  Respiratory: Positive for cough and shortness of breath. Negative for chest tightness and wheezing.   Cardiovascular: Positive for leg swelling. Negative for palpitations.  Gastrointestinal: Negative for nausea and vomiting.  Genitourinary: Negative for dysuria.  Musculoskeletal: Negative for joint swelling.  Skin: Negative for rash.  Neurological: Negative for headaches.  Hematological: Does not  bruise/bleed easily.  Psychiatric/Behavioral: Negative for dysphoric mood. The patient is not nervous/anxious.        Objective:   Physical Exam  Constitutional: She is oriented to person, place, and time. She appears well-developed and well-nourished. No distress.  --------------------           06/29/15              1414    --------------------  BP:       188/94   --------------------    HENT:  Head: Normocephalic and atraumatic.  Right Ear: External ear normal.  Left Ear: External ear normal.  Mouth/Throat: Oropharynx is clear and moist. No oropharyngeal exudate.  Eyes: Conjunctivae and EOM are normal. Pupils are equal, round, and reactive to light. Right eye exhibits no discharge. Left eye exhibits no discharge. No scleral icterus.  Neck: Normal range of motion. Neck supple. No JVD present. No tracheal deviation present. No thyromegaly present.  Cardiovascular: Normal rate, regular rhythm, normal heart sounds and intact distal pulses.  Exam reveals no gallop and no friction rub.   No murmur heard. ? Loud P2  Pulmonary/Chest: Effort normal and breath sounds normal. No respiratory distress. She has no wheezes. She has no rales. She exhibits no tenderness.  Abdominal: Soft. Bowel sounds are normal. She exhibits no distension and no mass. There is no tenderness. There is no rebound and no guarding.  Musculoskeletal: Normal range of motion. She exhibits edema. She exhibits no tenderness.  3+ edema - chronic venosu stasis  Lymphadenopathy:    She has no cervical adenopathy.  Neurological: She is alert and oriented to person, place, and time. She has normal reflexes. No cranial nerve deficit. She exhibits normal muscle tone. Coordination normal.  Skin: Skin is warm and dry. No rash noted. She is not diaphoretic. No erythema. No pallor.  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 Her behavior is normal. Judgment and thought  content normal.  Vitals reviewed.   There were no vitals filed for this visit.       Assessment & Plan:     ICD-9-CM ICD-10-CM   1. 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 Obesity 278.00 E66.9    - concern high for ILD +/- pulm htn with opr without diast dysfn  - she is desaturating - there might be OSA too given obesity  Plan PFT HRCT Right heart cath by Dr Einar Gip ONO - roomn air  regrouo in 2-4 weeks   Dr. Brand Males, M.D., [facility].C.P Pulmonary and Critical Care Medicine Staff Physician [facility] Pager: [phone], If no answer or between  15:00h - 7:00h: call [phone]  06/29/2015 2:43 PM     -

## 2015-07-08 ENCOUNTER — Ambulatory Visit (INDEPENDENT_AMBULATORY_CARE_PROVIDER_SITE_OTHER)
Admission: RE | Admit: 2015-07-08 | Discharge: 2015-07-08 | Disposition: A | Discharge status: Home / Self Care | Payer: No Typology Code available for payment source | Source: Ambulatory Visit | Attending: Internal Medicine | Admitting: Internal Medicine

## 2015-07-08 DIAGNOSIS — R0689 Other abnormalities of breathing: Secondary | ICD-10-CM

## 2015-07-08 DIAGNOSIS — R0600 Dyspnea, unspecified: Secondary | ICD-10-CM

## 2015-07-21 ENCOUNTER — Other Ambulatory Visit (HOSPITAL_COMMUNITY): Payer: Self-pay | Admitting: Nephrology

## 2015-07-21 ENCOUNTER — Other Ambulatory Visit: Payer: Self-pay | Admitting: Nephrology

## 2015-07-21 DIAGNOSIS — R809 Proteinuria, unspecified: Secondary | ICD-10-CM

## 2015-07-21 DIAGNOSIS — N009 Acute nephritic syndrome with unspecified morphologic changes: Secondary | ICD-10-CM

## 2015-07-22 ENCOUNTER — Encounter: Payer: Self-pay | Admitting: Family Medicine

## 2015-07-22 DIAGNOSIS — N049 Nephrotic syndrome with unspecified morphologic changes: Secondary | ICD-10-CM | POA: Insufficient documentation

## 2015-07-25 ENCOUNTER — Ambulatory Visit
Admission: RE | Admit: 2015-07-25 | Discharge: 2015-07-25 | Disposition: A | Discharge status: Home / Self Care | Payer: BLUE CROSS/BLUE SHIELD | Source: Ambulatory Visit | Attending: Nephrology | Admitting: Nephrology

## 2015-07-25 DIAGNOSIS — N009 Acute nephritic syndrome with unspecified morphologic changes: Secondary | ICD-10-CM

## 2015-07-26 ENCOUNTER — Other Ambulatory Visit: Payer: Self-pay | Admitting: Radiology

## 2015-07-27 ENCOUNTER — Encounter (HOSPITAL_COMMUNITY): Payer: Self-pay | Admitting: General Practice

## 2015-07-27 ENCOUNTER — Encounter (HOSPITAL_COMMUNITY): Payer: Self-pay

## 2015-07-27 ENCOUNTER — Observation Stay (HOSPITAL_COMMUNITY)
Admission: AD | Admit: 2015-07-27 | Discharge: 2015-07-28 | Disposition: A | Discharge status: Home / Self Care | Payer: BLUE CROSS/BLUE SHIELD | Source: Ambulatory Visit | Attending: Interventional Radiology | Admitting: Interventional Radiology

## 2015-07-27 ENCOUNTER — Ambulatory Visit (HOSPITAL_COMMUNITY)
Admission: RE | Admit: 2015-07-27 | Discharge: 2015-07-27 | Disposition: A | Discharge status: Home / Self Care | Payer: BLUE CROSS/BLUE SHIELD | Source: Ambulatory Visit | Attending: Nephrology | Admitting: Nephrology

## 2015-07-27 ENCOUNTER — Telehealth: Payer: Self-pay | Admitting: Internal Medicine

## 2015-07-27 DIAGNOSIS — M349 Systemic sclerosis, unspecified: Secondary | ICD-10-CM | POA: Diagnosis not present

## 2015-07-27 DIAGNOSIS — R600 Localized edema: Secondary | ICD-10-CM | POA: Insufficient documentation

## 2015-07-27 DIAGNOSIS — Z880 Allergy status to penicillin: Secondary | ICD-10-CM | POA: Diagnosis not present

## 2015-07-27 DIAGNOSIS — Z793 Long term (current) use of hormonal contraceptives: Secondary | ICD-10-CM | POA: Insufficient documentation

## 2015-07-27 DIAGNOSIS — Z7982 Long term (current) use of aspirin: Secondary | ICD-10-CM | POA: Diagnosis not present

## 2015-07-27 DIAGNOSIS — R809 Proteinuria, unspecified: Secondary | ICD-10-CM | POA: Diagnosis present

## 2015-07-27 DIAGNOSIS — Z87891 Personal history of nicotine dependence: Secondary | ICD-10-CM | POA: Diagnosis not present

## 2015-07-27 DIAGNOSIS — Z7984 Long term (current) use of oral hypoglycemic drugs: Secondary | ICD-10-CM | POA: Insufficient documentation

## 2015-07-27 DIAGNOSIS — E039 Hypothyroidism, unspecified: Secondary | ICD-10-CM | POA: Insufficient documentation

## 2015-07-27 DIAGNOSIS — I129 Hypertensive chronic kidney disease with stage 1 through stage 4 chronic kidney disease, or unspecified chronic kidney disease: Principal | ICD-10-CM | POA: Insufficient documentation

## 2015-07-27 DIAGNOSIS — N189 Chronic kidney disease, unspecified: Secondary | ICD-10-CM | POA: Diagnosis not present

## 2015-07-27 DIAGNOSIS — Z79899 Other long term (current) drug therapy: Secondary | ICD-10-CM | POA: Insufficient documentation

## 2015-07-27 HISTORY — DX: Cellulitis of right lower limb: L03.115

## 2015-07-27 HISTORY — DX: Systemic sclerosis, unspecified: M34.9

## 2015-07-27 HISTORY — DX: Unspecified nephritic syndrome with unspecified morphologic changes: N05.9

## 2015-07-27 HISTORY — DX: Type 2 diabetes mellitus without complications: E11.9

## 2015-07-27 HISTORY — PX: RENAL BIOPSY: SHX156

## 2015-07-27 HISTORY — DX: Essential (primary) hypertension: I10

## 2015-07-27 HISTORY — DX: Cellulitis of left lower limb: L03.116

## 2015-07-27 LAB — CBC
HCT: 37.8 % (ref 36.0–46.0)
Hemoglobin: 12.6 g/dL (ref 12.0–15.0)
MCH: 28 pg (ref 26.0–34.0)
MCHC: 33.3 g/dL (ref 30.0–36.0)
MCV: 84 fL (ref 78.0–100.0)
PLATELETS: 425 10*3/uL — AB (ref 150–400)
RBC: 4.5 MIL/uL (ref 3.87–5.11)
RDW: 13.6 % (ref 11.5–15.5)
WBC: 9.1 10*3/uL (ref 4.0–10.5)

## 2015-07-27 LAB — PROTIME-INR
INR: 1.17 (ref 0.00–1.49)
Prothrombin Time: 15.1 seconds (ref 11.6–15.2)

## 2015-07-27 LAB — APTT: APTT: 33 s (ref 24–37)

## 2015-07-27 MED ORDER — LIDOCAINE HCL (PF) 1 % IJ SOLN
INTRAMUSCULAR | Status: AC
  Filled 2015-07-27: qty 10

## 2015-07-27 MED ORDER — SODIUM CHLORIDE 0.9 % IV SOLN: 250.0000 mL | INTRAVENOUS | Status: DC | PRN

## 2015-07-27 MED ORDER — SODIUM CHLORIDE 0.9 % IV SOLN: Freq: Once | INTRAVENOUS | Status: DC

## 2015-07-27 MED ORDER — MIDAZOLAM HCL 2 MG/2ML IJ SOLN
INTRAMUSCULAR | Status: AC | PRN
  Administered 2015-07-27: 1 mg via INTRAVENOUS

## 2015-07-27 MED ORDER — ENSURE ENLIVE PO LIQD: 237.0000 mL | Freq: Two times a day (BID) | ORAL | Status: DC

## 2015-07-27 MED ORDER — SPIRONOLACTONE 25 MG PO TABS
25.0000 mg | ORAL_TABLET | Freq: Every day | ORAL | Status: DC
Start: 2015-07-27 — End: 2015-07-28

## 2015-07-27 MED ORDER — AMLODIPINE BESYLATE 10 MG PO TABS: 10.0000 mg | ORAL_TABLET | Freq: Every day | ORAL | Status: DC

## 2015-07-27 MED ORDER — HYDROCODONE-ACETAMINOPHEN 5-325 MG PO TABS: 1.0000 | ORAL_TABLET | ORAL | Status: DC | PRN

## 2015-07-27 MED ORDER — ASPIRIN EC 81 MG PO TBEC: 81.0000 mg | DELAYED_RELEASE_TABLET | Freq: Every day | ORAL | Status: DC

## 2015-07-27 MED ORDER — ONDANSETRON HCL 4 MG/2ML IJ SOLN: 4.0000 mg | Freq: Four times a day (QID) | INTRAMUSCULAR | Status: DC | PRN

## 2015-07-27 MED ORDER — FENTANYL CITRATE (PF) 100 MCG/2ML IJ SOLN
INTRAMUSCULAR | Status: AC | PRN
  Administered 2015-07-27: 50 ug via INTRAVENOUS

## 2015-07-27 MED ORDER — BOOST / RESOURCE BREEZE PO LIQD: 1.0000 | Freq: Three times a day (TID) | ORAL | Status: DC

## 2015-07-27 MED ORDER — ACETAMINOPHEN 650 MG RE SUPP: 650.0000 mg | Freq: Four times a day (QID) | RECTAL | Status: DC | PRN

## 2015-07-27 MED ORDER — DOCUSATE SODIUM 100 MG PO CAPS: 100.0000 mg | ORAL_CAPSULE | Freq: Two times a day (BID) | ORAL | Status: DC

## 2015-07-27 MED ORDER — MIDAZOLAM HCL 2 MG/2ML IJ SOLN
INTRAMUSCULAR | Status: AC
  Filled 2015-07-27: qty 4

## 2015-07-27 MED ORDER — SENNOSIDES-DOCUSATE SODIUM 8.6-50 MG PO TABS: 1.0000 | ORAL_TABLET | Freq: Every day | ORAL | Status: DC | PRN

## 2015-07-27 MED ORDER — LABETALOL HCL 100 MG PO TABS: 100.0000 mg | ORAL_TABLET | Freq: Two times a day (BID) | ORAL | Status: DC

## 2015-07-27 MED ORDER — GELATIN ABSORBABLE 12-7 MM EX MISC
CUTANEOUS | Status: AC
  Filled 2015-07-27: qty 1

## 2015-07-27 MED ORDER — METFORMIN HCL 500 MG PO TABS: 1000.0000 mg | ORAL_TABLET | Freq: Every day | ORAL | Status: DC

## 2015-07-27 MED ORDER — LEVOTHYROXINE SODIUM 75 MCG PO TABS: 75.0000 ug | ORAL_TABLET | Freq: Every day | ORAL | Status: DC

## 2015-07-27 MED ORDER — MEDROXYPROGESTERONE ACETATE 150 MG/ML IM SUSP: 150.0000 mg | INTRAMUSCULAR | Status: DC

## 2015-07-27 MED ORDER — HYDROCODONE-ACETAMINOPHEN 5-325 MG PO TABS
1.0000 | ORAL_TABLET | Freq: Four times a day (QID) | ORAL | Status: DC | PRN
  Administered 2015-07-27: 1 via ORAL
  Filled 2015-07-27: qty 1

## 2015-07-27 MED ORDER — SODIUM CHLORIDE 0.9 % IV SOLN
INTRAVENOUS | Status: AC | PRN
  Administered 2015-07-27: 10 mL/h via INTRAVENOUS

## 2015-07-27 MED ORDER — FENTANYL CITRATE (PF) 100 MCG/2ML IJ SOLN
INTRAMUSCULAR | Status: AC
  Filled 2015-07-27: qty 4

## 2015-07-27 MED ORDER — CONCEPT DHA 53.5-38-1 MG PO CAPS: 1.0000 | ORAL_CAPSULE | Freq: Every day | ORAL | Status: DC

## 2015-07-27 MED ORDER — ACETAMINOPHEN 325 MG PO TABS: 650.0000 mg | ORAL_TABLET | Freq: Four times a day (QID) | ORAL | Status: DC | PRN

## 2015-07-27 NOTE — Sedation Documentation (Signed)
Patient is resting comfortably. 

## 2015-07-27 NOTE — Telephone Encounter (Addendum)
ono - transient desats only  - no need for night o2  Reviewed Dr Einar Gip notes - ct shows co art calcification 0- he is doing lexiscan myoview. Dependong on this result he will decideon right heart cath (Dated note 07/12/15)

## 2015-07-27 NOTE — Progress Notes (Addendum)
Paged provider to notify of patient's arrival to the unit and for further orders. Will continue to wait for orders. Spoke with provider McCollough who gave a few admission orders for patient.

## 2015-07-27 NOTE — Procedures (Signed)
Interventional Radiology Procedure Note  Procedure: US guided left medical renal biopsy.  2 x 16G biopsy.  Complications: None Recommendations:  - Will admit 23 hr obs for overnight - follow up surg path - OTC's for discomfort - monitor vital signs overnight - Ok to shower tomorrow - Do not submerge for 7 days - Routine wound care   Signed,  Dulcy Fanny. Earleen Newport, DO

## 2015-07-27 NOTE — H&P (Signed)
Chief Complaint: Patient was seen in consultation today for random renal biopsy at the request of Coladonato,Joseph  Referring Physician(s): Coladonato,Joseph  History of Present Illness: Rebecca Mullins is a 40 y.o. female   Hx glomerulonephritis Scleroderma New lower extremity edema Noted proteinuria/hematuria Nephrotic syndrome Increased serum creatinine Request for random renal biopsy per Dr Marval Regal   Past Medical History  Diagnosis Date  . Cellulitis     BIL leg  . Anemia   . Strep throat   . Hypothyroidism     Past Surgical History  Procedure Laterality Date  . No past surgeries      Allergies: Penicillins  Medications: Prior to Admission medications   Medication Sig Start Date End Date Taking? Authorizing Provider  amLODipine (NORVASC) 10 MG tablet Take 10 mg by mouth daily.   Yes Historical Provider, MD  aspirin EC 81 MG tablet Take 81 mg by mouth daily.   Yes Historical Provider, MD  labetalol (NORMODYNE) 100 MG tablet Take 100 mg by mouth 2 (two) times daily.   Yes Historical Provider, MD  levothyroxine (SYNTHROID, LEVOTHROID) 75 MCG tablet Take 75 mcg by mouth daily before breakfast.   Yes Historical Provider, MD  medroxyPROGESTERone (DEPO-PROVERA) 150 MG/ML injection Inject 1 mL (150 mg total) into the muscle every 3 (three) months. 06/25/15  Yes Bennett Scrape V, PA-C  metFORMIN (GLUCOPHAGE) 500 MG tablet Take 1,000 mg by mouth at bedtime.   Yes Historical Provider, MD  Prenat-FeFum-FePo-FA-Omega 3 (CONCEPT DHA) 53.5-38-1 MG CAPS Take 1 capsule by mouth daily.   Yes Historical Provider, MD  spironolactone (ALDACTONE) 25 MG tablet Take 25 mg by mouth daily.   Yes Historical Provider, MD     Family History  Problem Relation Age of Onset  . Diabetes Mother   . Cancer Mother   . Hyperlipidemia Father   . Diabetes Brother     Social History   Social History  . Marital Status: Single    Spouse Name: N/A  . Number of Children: N/A  . Years of  Education: N/A   Social History Main Topics  . Smoking status: Former Smoker -- 1.00 packs/day for 2 years    Types: Cigarettes    Start date: 02/28/1993    Quit date: 03/01/1995  . Smokeless tobacco: Never Used  . Alcohol Use: 1.2 oz/week    2 Glasses of wine per week     Comment: occsionally  . Drug Use: No  . Sexual Activity: Yes    Birth Control/ Protection: None   Other Topics Concern  . None   Social History Narrative     Review of Systems: A 12 point ROS discussed and pertinent positives are indicated in the HPI above.  All other systems are negative.  Review of Systems  Constitutional: Positive for fever. Negative for activity change, appetite change, fatigue and unexpected weight change.  Respiratory: Negative for cough and shortness of breath.   Gastrointestinal: Negative for abdominal pain.  Genitourinary: Positive for hematuria. Negative for difficulty urinating.  Neurological: Negative for weakness.  Psychiatric/Behavioral: Negative for behavioral problems and confusion.    Vital Signs: BP 150/83 mmHg  Pulse 97  Temp(Src) 98.2 F (36.8 C) (Oral)  Resp 18  Ht 5\' 2"  (1.575 m)  Wt 250 lb (113.399 kg)  BMI 45.71 kg/m2  SpO2 100%  LMP 02/27/2015  Physical Exam  Constitutional: She is oriented to person, place, and time. She appears well-nourished.  Cardiovascular: Normal rate, regular rhythm and normal heart sounds.  Pulmonary/Chest: Effort normal and breath sounds normal. She has no wheezes.  Abdominal: Soft. Bowel sounds are normal. There is no tenderness.  Musculoskeletal: Normal range of motion.  Neurological: She is alert and oriented to person, place, and time.  Skin: Skin is warm and dry.  Psychiatric: She has a normal mood and affect. Her behavior is normal. Judgment and thought content normal.  Nursing note and vitals reviewed.   Mallampati Score:  MD Evaluation Airway: WNL Heart: WNL Abdomen: WNL Chest/ Lungs: WNL ASA  Classification:  2, 3 Mallampati/Airway Score: One  Imaging: US Renal  07/25/2015  CLINICAL DATA:  History of glomerulonephritis. EXAM: RENAL / URINARY TRACT ULTRASOUND COMPLETE COMPARISON:  None. FINDINGS: Right Kidney: Length: 11.6. Echogenicity within normal limits. No mass or hydronephrosis visualized. Left Kidney: Length: 11.6. Echogenicity within normal limits. No mass or hydronephrosis visualized. Bladder: Appears normal for degree of bladder distention. IMPRESSION: Normal renal ultrasound. Electronically Signed   By: Fidela Salisbury M.D.   On: 07/25/2015 15:15   Ct Chest High Resolution  07/08/2015  CLINICAL DATA:  Shortness of breath for 6 months with recent worsening. Chest pain. Former smoker, quit in 1996. EXAM: CT CHEST WITHOUT CONTRAST TECHNIQUE: Multidetector CT imaging of the chest was performed following the standard protocol without intravenous contrast. High resolution imaging of the lungs, as well as inspiratory and expiratory imaging, was performed. COMPARISON:  No prior chest CT.  04/21/2015 chest radiograph. FINDINGS: Mediastinum/Nodes: Top-normal heart size. No pericardial fluid/thickening. Left anterior descending and right coronary atherosclerosis. Great vessels are normal in course and caliber. Normal visualized thyroid. Fluid level throughout the slightly patulous thoracic esophagus. No axillary adenopathy. There are several mildly enlarged right paratracheal lymph nodes, largest 1.4 cm (series 4/ image 20). There is an enlarged 1.4 cm subcarinal node (series 4/ image 26). There is a suggestion of mild bilateral hilar lymphadenopathy, poorly delineated on this noncontrast CT. Lungs/Pleura: No pneumothorax. No pleural effusion. There is a subpleural 4 mm right upper lobe pulmonary nodule (series 8/image 14). There is a subpleural 3 mm left upper lobe pulmonary nodule (series 8/image 14). No acute consolidative airspace disease, additional significant pulmonary nodules or lung masses. There is  extensive ground-glass attenuation involving the peribronchovascular and peripheral portions of both lungs, with the greatest severity in the lower lobes, although with significant involvement of all lung lobes. There are areas of sparing of the ground-glass attenuation in the immediate subpleural regions of the lungs bilaterally. No significant regions of subpleural reticulation, traction bronchiectasis, parenchymal banding or frank honeycombing. No significant air trapping on the expiration sequences. Upper abdomen: Unremarkable. Musculoskeletal: No aggressive appearing focal osseous lesions. Mild-to-moderate degenerative changes in the lower thoracic spine. Mild levocurvature of the lower thoracic spine. IMPRESSION: 1. Extensive ground-glass attenuation throughout the peribronchovascular and peripheral portions of both lungs, with areas of sparing in the immediate subpleural regions of the lungs. No significant regions of subpleural reticulation, traction bronchiectasis or frank honeycombing. 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 Given the slightly patulous fluid-filled thoracic esophagus, consider scleroderma. 2. Mild to moderate mediastinal and likely mild bilateral hilar lymphadenopathy, nonspecific, likely reactive. 3. Two-vessel coronary atherosclerosis. 4. Two tiny subpleural pulmonary nodules in the upper lobes, largest 4 mm, probably benign. Given risk factors for bronchogenic carcinoma, follow-up chest CT at 1 year is recommended. This recommendation follows the consensus statement: Guidelines for Management of Small Pulmonary Nodules Detected on CT Scans: A Statement from the [employer] as published in Radiology 2005; 237:395-400. Electronically Signed  By: Ilona Sorrel M.D.   On: 07/08/2015 12:22    Labs:  CBC:  Recent Labs  01/13/15 1625 07/27/15 1200  WBC 9.3 9.1  HGB 11.7* 12.6  HCT 35.8* 37.8  PLT 374 425*    COAGS:  Recent Labs   07/27/15 1200  INR 1.17  APTT 33    BMP: No results for input(s): NA, K, CL, CO2, GLUCOSE, BUN, CALCIUM, CREATININE, GFRNONAA, GFRAA in the last 8760 hours.  Invalid input(s): CMP  LIVER FUNCTION TESTS: No results for input(s): BILITOT, AST, ALT, ALKPHOS, PROT, ALBUMIN in the last 8760 hours.  TUMOR MARKERS: No results for input(s): AFPTM, CEA, CA199, CHROMGRNA in the last 8760 hours.  Assessment and Plan:  Proteinuria/hematuria Nephrotic syndrome Scheduled for random renal biopsy Risks and Benefits discussed with the patient including, but not limited to bleeding, infection, damage to adjacent structures or low yield requiring additional tests. All of the patient's questions were answered, patient is agreeable to proceed. Consent signed and in chart.   Thank you for this interesting consult.  I greatly enjoyed meeting Tamesia Yeargin and look forward to participating in their care.  A copy of this report was sent to the requesting provider on this date.  Electronically Signed: Lela Gell A 07/27/2015, 12:50 PM   I spent a total of  30 Minutes   in face to face in clinical consultation, greater than 50% of which was counseling/coordinating care for random renal biopsy

## 2015-07-27 NOTE — Sedation Documentation (Signed)
Pt ate post procedure with no complications. Pt up to the bathroom with a steady gait. Pt denies seeing blood in urine.

## 2015-07-27 NOTE — Sedation Documentation (Signed)
Report given to Jennifer RN

## 2015-07-27 NOTE — Sedation Documentation (Signed)
Patient denies pain and is resting comfortably.  

## 2015-07-28 ENCOUNTER — Telehealth: Payer: Self-pay | Admitting: Adult Health

## 2015-07-28 ENCOUNTER — Ambulatory Visit (INDEPENDENT_AMBULATORY_CARE_PROVIDER_SITE_OTHER): Payer: BLUE CROSS/BLUE SHIELD | Admitting: Adult Health

## 2015-07-28 ENCOUNTER — Encounter: Payer: Self-pay | Admitting: Adult Health

## 2015-07-28 VITALS — BP 122/78 | HR 89 | Ht 62.0 in | Wt 252.0 lb

## 2015-07-28 DIAGNOSIS — J849 Interstitial pulmonary disease, unspecified: Secondary | ICD-10-CM | POA: Insufficient documentation

## 2015-07-28 DIAGNOSIS — I129 Hypertensive chronic kidney disease with stage 1 through stage 4 chronic kidney disease, or unspecified chronic kidney disease: Secondary | ICD-10-CM | POA: Diagnosis not present

## 2015-07-28 LAB — CBC
HEMATOCRIT: 35.3 % — AB (ref 36.0–46.0)
HEMOGLOBIN: 11.8 g/dL — AB (ref 12.0–15.0)
MCH: 28.2 pg (ref 26.0–34.0)
MCHC: 33.4 g/dL (ref 30.0–36.0)
MCV: 84.2 fL (ref 78.0–100.0)
Platelets: 379 10*3/uL (ref 150–400)
RBC: 4.19 MIL/uL (ref 3.87–5.11)
RDW: 13.7 % (ref 11.5–15.5)
WBC: 8.5 10*3/uL (ref 4.0–10.5)

## 2015-07-28 MED ORDER — LABETALOL HCL 100 MG PO TABS
100.0000 mg | ORAL_TABLET | Freq: Two times a day (BID) | ORAL | Status: DC
  Administered 2015-07-28: 100 mg via ORAL
  Filled 2015-07-28: qty 1

## 2015-07-28 MED ORDER — AMLODIPINE BESYLATE 10 MG PO TABS
10.0000 mg | ORAL_TABLET | Freq: Every day | ORAL | Status: DC
  Administered 2015-07-28: 10 mg via ORAL
  Filled 2015-07-28: qty 1

## 2015-07-28 MED ORDER — SODIUM CHLORIDE 0.9 % IV SOLN: 250.0000 mL | INTRAVENOUS | Status: DC | PRN

## 2015-07-28 MED ORDER — ASPIRIN EC 81 MG PO TBEC
81.0000 mg | DELAYED_RELEASE_TABLET | Freq: Every day | ORAL | Status: DC
  Administered 2015-07-28: 81 mg via ORAL
  Filled 2015-07-28: qty 1

## 2015-07-28 MED ORDER — ACETAMINOPHEN 650 MG RE SUPP: 650.0000 mg | Freq: Four times a day (QID) | RECTAL | Status: DC | PRN

## 2015-07-28 MED ORDER — METFORMIN HCL 500 MG PO TABS: 1000.0000 mg | ORAL_TABLET | Freq: Every day | ORAL | Status: DC

## 2015-07-28 MED ORDER — ACETAMINOPHEN 325 MG PO TABS: 650.0000 mg | ORAL_TABLET | Freq: Four times a day (QID) | ORAL | Status: DC | PRN

## 2015-07-28 MED ORDER — MEDROXYPROGESTERONE ACETATE 150 MG/ML IM SUSP: 150.0000 mg | INTRAMUSCULAR | Status: DC

## 2015-07-28 MED ORDER — LEVOTHYROXINE SODIUM 75 MCG PO TABS
75.0000 ug | ORAL_TABLET | Freq: Every day | ORAL | Status: DC
  Administered 2015-07-28: 75 ug via ORAL
  Filled 2015-07-28: qty 1

## 2015-07-28 MED ORDER — SPIRONOLACTONE 25 MG PO TABS
25.0000 mg | ORAL_TABLET | Freq: Every day | ORAL | Status: DC
  Administered 2015-07-28: 25 mg via ORAL
  Filled 2015-07-28: qty 1

## 2015-07-28 MED ORDER — PRENATAL MULTIVITAMIN CH
1.0000 | ORAL_TABLET | Freq: Every day | ORAL | Status: DC
  Administered 2015-07-28: 1 via ORAL
  Filled 2015-07-28 (×2): qty 1

## 2015-07-28 NOTE — Progress Notes (Signed)
Subjective:    Patient ID: Rebecca Mullins, female    DOB: 1976-06-30, 40 y.o.   MRN: [id number] PCP Pcp Not In System Referred by Dr Armanda Heritage  40 yo female former smoker with severe HTN seen for pulmonary consult 06/2016 for dyspnea  TEST  Walking desaturation test 185 feet 3 laps on room air: She desaturated to 85% of the second lap. ANA test was positive but ENA, double-stranded DNA,  07/28/2015 Follow up : Dyspnea  Pt was seen for pulmonary consult 06/29/15 for dyspnea. She was referred by rheumatology with concern for possible scleroderma. 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 Mild to mod mediastinal adenopahty. 2 upper lobes subpleural nodules.  ONO transient desats only , no O2 recommended . She was referred by rheumatology with concern for possible scleroderma  Has been referred to [facility] .  Renal biopsy yesterday for nephrotic syndrome /proteninuria .  She missed her PFT ov due to this bx..  Has upcoming cardiac stress test  [facility] home health .  Says she wears out easily and gets out of breath with activities .  Exercise is hard for her. Inclines are very difficult.  Has dry cough .  No fever or discolored mucus.    Past Medical History  Diagnosis Date  . Bilateral cellulitis of lower leg   . Anemia   . Strep throat 1977; winter 2016  . Hypothyroidism   . Scleroderma ([place])     "fingers" (07/27/2015)  . Glomerulonephritis     Archie Endo 07/25/2015  . Hypertension     "just starting" (07/27/2015)  . Type II diabetes mellitus ([place]) dx'd 09/2014     Family History  Problem Relation Age of Onset  . Diabetes Mother   . Cancer Mother   . Hyperlipidemia Father   . Diabetes Brother    Current Outpatient Prescriptions on File Prior to Visit  Medication Sig Dispense Refill  . amLODipine (NORVASC) 10 MG tablet Take 10 mg by mouth daily.    Marland Kitchen labetalol (NORMODYNE) 100 MG tablet Take 100 mg by mouth 2 (two) times  daily.    Marland Kitchen levothyroxine (SYNTHROID, LEVOTHROID) 75 MCG tablet Take 75 mcg by mouth daily before breakfast.    . medroxyPROGESTERone (DEPO-PROVERA) 150 MG/ML injection Inject 1 mL (150 mg total) into the muscle every 3 (three) months. 1 mL 1  . metFORMIN (GLUCOPHAGE) 500 MG tablet Take 1,000 mg by mouth at bedtime.    . Prenat-FeFum-FePo-FA-Omega 3 (CONCEPT DHA) 53.5-38-1 MG CAPS Take 1 capsule by mouth daily.    Marland Kitchen spironolactone (ALDACTONE) 25 MG tablet Take 25 mg by mouth daily.    Marland Kitchen aspirin EC 81 MG tablet Take 81 mg by mouth daily. Reported on 07/28/2015     No current facility-administered medications on file prior to visit.     Review of Systems  Constitutional: Negative for fever and unexpected weight change.  HENT: Negative for congestion, dental problem, ear pain, nosebleeds, postnasal drip, rhinorrhea, sinus pressure, sneezing, sore throat and trouble swallowing.   Eyes: Negative for redness and itching.  Respiratory: Positive for cough and shortness of breath. Negative for chest tightness and wheezing.   Cardiovascular: Positive for leg swelling. Negative for palpitations.  Gastrointestinal: Negative for nausea and vomiting.  Genitourinary: Negative for dysuria.  Musculoskeletal: Negative for joint swelling.  Skin: Negative for rash.  Neurological: Negative for headaches.  Hematological: Does not bruise/bleed easily.  Psychiatric/Behavioral: Negative for dysphoric mood. The  patient is not nervous/anxious.        Objective:   Physical Exam  Filed Vitals:   07/28/15 1406  BP: 122/78  Pulse: 89  Height: 5\' 2"  (1.575 m)  Weight: 252 lb (114.306 kg)  SpO2: 99%    GEN: A/Ox3; pleasant , NAD, obese   HEENT:  [place]/AT,  EACs-clear, TMs-wnl, NOSE-clear, THROAT-clear, no lesions, no postnasal drip or exudate noted. Tightened skin along lips and chin, ++facial hair    NECK:  Supple w/ fair ROM; no JVD; normal carotid impulses w/o bruits; no thyromegaly or nodules palpated; no  lymphadenopathy.  RESP  Clear  P & A; w/o, wheezes/ rales/ or rhonchi.no accessory muscle use, no dullness to percussion  CARD:  RRR, no m/r/g  , no peripheral edema, pulses intact, no cyanosis or clubbing.  GI:   Soft & nt; nml bowel sounds; no organomegaly or masses detected.  Musco: Warm bil, no deformities or joint swelling noted.   Neuro: alert, no focal deficits noted.    Skin: Warm, no lesions or rashes, tightened skin along fingertips

## 2015-07-28 NOTE — Telephone Encounter (Signed)
Spoke with pt, states she is being discharged from hospital currently, was told to reschedule her PFT.  Does not know how far she should reschedule appt.   Pt has appt today at 2:00 with TP, states she will be present for that visit. I advised pt that we can reschedule pft after pt sees TP.  Nothing further needed at this time.

## 2015-07-28 NOTE — Patient Instructions (Addendum)
Reschedule your PFT .  Follow up for Cardiac Stress Test as recommended.  follow up Dr. Chase Caller in 2 months and As needed

## 2015-07-28 NOTE — Progress Notes (Signed)
NURSING PROGRESS NOTE  Robbye Spangler [id number] Discharge Data: 07/28/2015 11:36 AM Attending Provider: Corrie Mckusick, DO PCP:Pcp Not In System     [facility] to be D/C'd Home per MD order.  Discussed with the patient the After Visit Summary and all questions fully answered. Patient following up with nephrologist on 2/16. All IV's discontinued with no bleeding noted. All belongings returned to patient for patient to take home.   Last Vital Signs:  Height 5\' 2"  (1.575 m), weight 110.8 kg (244 lb 4.3 oz), last menstrual period 02/27/2015, SpO2 98 %, unknown if currently breastfeeding.  Discharge Medication List   Medication List    TAKE these medications        amLODipine 10 MG tablet  Commonly known as:  NORVASC  Take 10 mg by mouth daily.     aspirin EC 81 MG tablet  Take 81 mg by mouth daily. Reported on 07/27/2015     CONCEPT DHA 53.5-38-1 MG Caps  Take 1 capsule by mouth daily.     labetalol 100 MG tablet  Commonly known as:  NORMODYNE  Take 100 mg by mouth 2 (two) times daily.     levothyroxine 75 MCG tablet  Commonly known as:  SYNTHROID, LEVOTHROID  Take 75 mcg by mouth daily before breakfast.     medroxyPROGESTERone 150 MG/ML injection  Commonly known as:  DEPO-PROVERA  Inject 1 mL (150 mg total) into the muscle every 3 (three) months.     metFORMIN 500 MG tablet  Commonly known as:  GLUCOPHAGE  Take 1,000 mg by mouth at bedtime.     spironolactone 25 MG tablet  Commonly known as:  ALDACTONE  Take 25 mg by mouth daily.         Doristine Devoid, RN

## 2015-07-28 NOTE — Assessment & Plan Note (Signed)
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 Mild to mod mediastinal adenopahty. 2 upper lobes subpleural nodules.  Pt w/ suspected scleroderma .  PFT are pending. , ONO neg for desats. O2 sats last ov with mild desats. We discussed O2 she wants to hold off for now.  Will have her return for PFT> she is awaiting [facility] referral  Will discuss case with Dr. Chase Caller , will need to coordinate care with Rheumatology .

## 2015-07-28 NOTE — Discharge Summary (Signed)
   Patient ID: Rebecca Mullins MRN: [id number] DOB/AGE: 12/16/1975 40 y.o.  Admit date: 07/27/2015 Discharge date: 07/28/2015  Admission Diagnoses: proteinuria  Discharge Diagnoses:  Active Problems:   * No active hospital problems. *   Discharged Condition: stable  Hospital Course: Nephrotic syndrome; proteinuria; hematuria Dr Marval Regal requested random renal biopsy. Performed in [place] with Dr Corrie Mckusick 07/27/15 Admitted for overnight observation. No complications Morning labs stable  UOP good; yellow Eating without N/V I have seen and examined pt. Discussed pt status with Dr Earleen Newport   Consults: none  Significant Diagnostic Studies: Renal US  Treatments: Left random renal biopsy  Discharge Exam: Height 5\' 2"  (1.575 m), weight 244 lb 4.3 oz (110.8 kg), last menstrual period 02/27/2015, SpO2 98 %, unknown if currently breastfeeding.   A/O Appropriate [place]: RRR Lungs: CTA Abd: soft; +BS; NT Left flank: NT; No bleeding; No hematoma Site of biopsy clean and dry Ext: FROM  Results for orders placed or performed during the hospital encounter of 07/27/15  CBC  Result Value Ref Range   WBC 8.5 4.0 - 10.5 K/uL   RBC 4.19 3.87 - 5.11 MIL/uL   Hemoglobin 11.8 (L) 12.0 - 15.0 g/dL   HCT 35.3 (L) 36.0 - 46.0 %   MCV 84.2 78.0 - 100.0 fL   MCH 28.2 26.0 - 34.0 pg   MCHC 33.4 30.0 - 36.0 g/dL   RDW 13.7 11.5 - 15.5 %   Platelets 379 150 - 400 K/uL    Disposition: Proteinuria Nephrotic syndrome Underwent Left random renal biopsy in IR 07/27/15 Has done well post procedure no complications Plan for discharge home today Rec: reschedule Pulmonary function testing; treadmill exercise for 2-3 days Resume all home meds Follow with Dr Marval Regal     Medication List    TAKE these medications        amLODipine 10 MG tablet  Commonly known as:  NORVASC  Take 10 mg by mouth daily.     aspirin EC 81 MG tablet  Take 81 mg by mouth daily. Reported on 07/27/2015       CONCEPT DHA 53.5-38-1 MG Caps  Take 1 capsule by mouth daily.     labetalol 100 MG tablet  Commonly known as:  NORMODYNE  Take 100 mg by mouth 2 (two) times daily.     levothyroxine 75 MCG tablet  Commonly known as:  SYNTHROID, LEVOTHROID  Take 75 mcg by mouth daily before breakfast.     medroxyPROGESTERone 150 MG/ML injection  Commonly known as:  DEPO-PROVERA  Inject 1 mL (150 mg total) into the muscle every 3 (three) months.     metFORMIN 500 MG tablet  Commonly known as:  GLUCOPHAGE  Take 1,000 mg by mouth at bedtime.     spironolactone 25 MG tablet  Commonly known as:  ALDACTONE  Take 25 mg by mouth daily.           Follow-up Information    Schedule an appointment as soon as possible for a visit with Donetta Potts, MD.   Specialty:  Nephrology   Why:  follow with Dr Marval Regal as per his office   Contact information:   [place] [address] [phone]        Electronically Signed: Monia Sabal A 07/28/2015, 9:22 AM   I have spent Greater Than 30 Minutes discharging Rebecca Mullins.

## 2015-08-10 ENCOUNTER — Encounter (HOSPITAL_COMMUNITY): Payer: Self-pay

## 2015-08-15 ENCOUNTER — Encounter (HOSPITAL_COMMUNITY): Payer: Self-pay

## 2015-09-05 ENCOUNTER — Institutional Professional Consult (permissible substitution): Payer: No Typology Code available for payment source | Admitting: Internal Medicine

## 2015-09-14 ENCOUNTER — Telehealth: Payer: Self-pay

## 2015-09-14 NOTE — Telephone Encounter (Signed)
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 Please fax to [phone] and the phone number is [phone]

## 2015-09-15 NOTE — Telephone Encounter (Signed)
Faxed x ray report to Tammy.

## 2015-09-16 ENCOUNTER — Ambulatory Visit (INDEPENDENT_AMBULATORY_CARE_PROVIDER_SITE_OTHER): Payer: BLUE CROSS/BLUE SHIELD | Admitting: *Deleted

## 2015-09-16 DIAGNOSIS — Z30013 Encounter for initial prescription of injectable contraceptive: Secondary | ICD-10-CM | POA: Diagnosis not present

## 2015-09-16 MED ORDER — MEDROXYPROGESTERONE ACETATE 150 MG/ML IM SUSY
150.0000 mg | PREFILLED_SYRINGE | Freq: Once | INTRAMUSCULAR | Status: AC
  Administered 2015-09-16: 150 mg via INTRAMUSCULAR

## 2015-09-16 NOTE — Progress Notes (Signed)
   Subjective:    Patient ID: Rebecca Mullins, female    DOB: January 15, 1976, 40 y.o.   MRN: [id number]  HPI Depo Provera 150 mg/ml injection only.    Review of Systems     Objective:   Physical Exam        Assessment & Plan:

## 2015-10-07 ENCOUNTER — Ambulatory Visit: Payer: BLUE CROSS/BLUE SHIELD | Admitting: Internal Medicine

## 2015-10-07 ENCOUNTER — Ambulatory Visit (INDEPENDENT_AMBULATORY_CARE_PROVIDER_SITE_OTHER): Payer: BLUE CROSS/BLUE SHIELD | Admitting: Internal Medicine

## 2015-10-07 DIAGNOSIS — R0689 Other abnormalities of breathing: Secondary | ICD-10-CM | POA: Diagnosis not present

## 2015-10-07 DIAGNOSIS — R0600 Dyspnea, unspecified: Secondary | ICD-10-CM

## 2015-10-07 LAB — PULMONARY FUNCTION TEST
DL/VA % PRED: 59 %
DL/VA: 2.84 ml/min/mmHg/L
DLCO unc % pred: 31 %
DLCO unc: 7.63 ml/min/mmHg
FEF 25-75 POST: 4.72 L/s
FEF 25-75 Pre: 1.16 L/sec
FEF2575-%Change-Post: 307 %
FEF2575-%Pred-Post: 166 %
FEF2575-%Pred-Pre: 40 %
FEV1-%CHANGE-POST: 36 %
FEV1-%PRED-PRE: 57 %
FEV1-%Pred-Post: 78 %
FEV1-PRE: 1.47 L
FEV1-Post: 2.01 L
FEV1FVC-%Change-Post: 33 %
FEV1FVC-%PRED-PRE: 86 %
FEV6-%Change-Post: 2 %
FEV6-%PRED-PRE: 66 %
FEV6-%Pred-Post: 67 %
FEV6-POST: 2.06 L
FEV6-PRE: 2.01 L
FEV6FVC-%Change-Post: 0 %
FEV6FVC-%PRED-POST: 101 %
FEV6FVC-%PRED-PRE: 102 %
FVC-%Change-Post: 2 %
FVC-%PRED-PRE: 65 %
FVC-%Pred-Post: 66 %
FVC-POST: 2.07 L
FVC-PRE: 2.01 L
POST FEV6/FVC RATIO: 99 %
PRE FEV6/FVC RATIO: 100 %
Post FEV1/FVC ratio: 97 %
Pre FEV1/FVC ratio: 73 %
RV % pred: 44 %
RV: 0.71 L
TLC % PRED: 54 %
TLC: 2.76 L

## 2015-10-07 NOTE — Progress Notes (Signed)
PFT done today. 

## 2015-10-11 ENCOUNTER — Ambulatory Visit (INDEPENDENT_AMBULATORY_CARE_PROVIDER_SITE_OTHER): Payer: BLUE CROSS/BLUE SHIELD | Admitting: Internal Medicine

## 2015-10-11 ENCOUNTER — Encounter: Payer: Self-pay | Admitting: Internal Medicine

## 2015-10-11 ENCOUNTER — Encounter: Payer: Self-pay | Admitting: Emergency Medicine

## 2015-10-11 VITALS — BP 128/86 | HR 87 | Ht 62.0 in | Wt 244.0 lb

## 2015-10-11 DIAGNOSIS — R0689 Other abnormalities of breathing: Secondary | ICD-10-CM | POA: Diagnosis not present

## 2015-10-11 DIAGNOSIS — M349 Systemic sclerosis, unspecified: Secondary | ICD-10-CM | POA: Diagnosis not present

## 2015-10-11 DIAGNOSIS — J849 Interstitial pulmonary disease, unspecified: Secondary | ICD-10-CM | POA: Diagnosis not present

## 2015-10-11 DIAGNOSIS — R0600 Dyspnea, unspecified: Secondary | ICD-10-CM

## 2015-10-11 NOTE — Progress Notes (Signed)
40 yo female former smoker with severe HTN seen for pulmonary consult 06/2016 for dyspnea    PCP Pcp Not In System Referred by Dr Armanda Heritage   HPI 06/29/15 Referred for dyspnea  40 year old morbidly obese f This is severe and uncontrolled or poorly controlled.hypertension.   This is associated with chronic severe venous stasis edema for the last 1 year. At the same time she's had insidious onset of shortness of breath that is progressively worsened. It is brought on by exertion climbing flight of stairs or walking to the car lot from our office. Relieved by rest. There is some associated chest tightness with this. In March 2016 she got pregnant and by June 2016 due to severe hypertension she had intrauterine death of her child. There is also some associated dry cough. With the shortness of breath. Symptoms are limiting her lifestyle significantly. Walking desaturation test 185 feet 3 laps on room air: She desaturated to 85% of the second lap.  Reviewing the outside referral notes by Dr. 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 ANA test was positive but ENA, double-stranded DNA, RNP, Smith, role and lower negative. On 06/13/2015 she's had additional analysis was SCL 70, C3, C4, rheumatoid factor, CCP, anticardiolipin, lupus anticoagulant, cryoglobulins, Anka, CK.  Review of the notes also show patient is been struggling with severe high blood pressure. In fact Dr D started her on amlodipine after talking to Dr. Einar Gip on 06/13/2015.  Now that is concern for autoimmune disease related or shortness of breath. High concern for interstitial lung disease I am concerned about   TEST  Walking desaturation test 185 feet 3 laps on room air: She desaturated to 85% of the second lap. ANA test was positive but ENA, double-stranded DNA,  07/28/2015 Follow up : Dyspnea  Pt was seen for pulmonary consult 06/29/15 for dyspnea. She was referred by  rheumatology with concern for possible scleroderma. 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 Mild to mod mediastinal adenopahty. 2 upper lobes subpleural nodules.  ONO transient desats only , no O2 recommended . She was referred by rheumatology with concern for possible scleroderma  Has been referred to [facility] .  Renal biopsy yesterday for nephrotic syndrome /proteninuria .  She missed her PFT ov due to this bx..  Has upcoming cardiac stress test  [facility] home [facility] .  Says she wears out easily and gets out of breath with activities .  Exercise is hard for her. Inclines are very difficult.  Has dry cough .  No fever or discolored mucus.     OV 10/11/2015  Chief Complaint  Patient presents with  . Follow-up    Pt here after PFT. Pt states her SOB has worsened since last OV. Pt states prod cough with yellow mucus.     follow-up scleroderma with shortness of breath and exertional hypoxemia   since I last saw her a few months ago she's had a high-resolution CT chest that shows groundglass opacities consistent with active inflammation consistent with scleroderma autoimmune disease. She had pulmonary function test 10/07/2015 that shows restriction with low diffusion consistent with interstitial lung disease and exertional hypoxemia. Overall she's stable. I discussed this with her. She meets indication for immunomodulators therapy CellCept in particular but she is scared of all the side effects and does not want to do it. She is seeking a second opinion from a rheumatologist at [facility] and wants  to wait till then. I did speak to Dr. Estanislado Pandy her rheumatologist and she is of agreement the patient needs CellCept follow-up patient is very keen that she get an opinion at [facility]. In addition I did speak to Dr. Estanislado Pandy  And cardiology notes indicate she's only had an echocardiogram. She's never had a right heart catheterization. Given  obesity and hypertension and diabetes she could have Fu for hypertension associated with diastolic dysfunction. She  Could also easily have group 1 autoimmune disease related pulmonary hypertension       has a past medical history of Bilateral cellulitis of lower leg; Anemia; Strep throat (40; winter 2016); Hypothyroidism; Scleroderma ([place]); Glomerulonephritis; Hypertension; and Type II diabetes mellitus ([place]) (dx'd 40/2016).   reports that she quit smoking about 20 years ago. Her smoking use included Cigarettes. She started smoking about 22 years ago. She has a 2 pack-year smoking history. She has never used smokeless tobacco.  Past Surgical History  Procedure Laterality Date  . Renal biopsy Left 07/27/2015    US guided medical renal biopsy    Allergies  Allergen Reactions  . Penicillins Anaphylaxis    Has patient had a PCN reaction causing immediate rash, facial/tongue/throat swelling, SOB or lightheadedness with hypotension: Yes Has patient had a PCN reaction causing severe rash involving mucus membranes or skin necrosis: No Has patient had a PCN reaction that required hospitalization No Has patient had a PCN reaction occurring within the last 10 years: No If all of the above answers are "NO", then may proceed with Cephalosporin use.      There is no immunization history on file for this patient.  Family History  Problem Relation Age of Onset  . Diabetes Mother   . Cancer Mother   . Hyperlipidemia Father   . Diabetes Brother      Current outpatient prescriptions:  .  amLODipine (NORVASC) 10 MG tablet, Take 10 mg by mouth daily., Disp: , Rfl:  .  aspirin EC 81 MG tablet, Take 81 mg by mouth daily. Reported on 07/28/2015, Disp: , Rfl:  .  labetalol (NORMODYNE) 100 MG tablet, Take 100 mg by mouth 2 (two) times daily., Disp: , Rfl:  .  levothyroxine (SYNTHROID, LEVOTHROID) 75 MCG tablet, Take 75 mcg by mouth daily before breakfast., Disp: , Rfl:  .  medroxyPROGESTERone  (DEPO-PROVERA) 150 MG/ML injection, Inject 1 mL (150 mg total) into the muscle every 3 (three) months., Disp: 1 mL, Rfl: 1 .  metFORMIN (GLUCOPHAGE) 500 MG tablet, Take 1,000 mg by mouth at bedtime., Disp: , Rfl:  .  Prenat-FeFum-FePo-FA-Omega 3 (CONCEPT DHA) 53.5-38-1 MG CAPS, Take 1 capsule by mouth daily., Disp: , Rfl:  .  spironolactone (ALDACTONE) 25 MG tablet, Take 25 mg by mouth daily., Disp: , Rfl:    ASSSESSMENT/PLAN    ICD-9-CM ICD-10-CM   1. Dyspnea and respiratory abnormality 786.09 R06.00     R06.89   2. ILD (interstitial lung disease) ([place]) 515 J84.9   3. 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 Brand Males, M.D., [facility].C.P Pulmonary and Critical Care Medicine Staff Physician [place] [facility] Pager: [phone], If no answer or between  15:00h - 7:00h: call [phone]  10/11/2015 2:42 PM

## 2015-10-11 NOTE — Patient Instructions (Addendum)
ICD-9-CM ICD-10-CM   1. Dyspnea and respiratory abnormality 786.09 R06.00     R06.89   2. ILD (interstitial lung disease) ([place]) 515 J84.9   3. 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

## 2015-10-27 IMAGING — CR DG CHEST 2V
3 series · 3 of 3 positions shown · non-contrast
Comparison: 05/02/2014 and prior radiographs

CLINICAL DATA: 39-year-old female with persistent cough.

EXAM:
CHEST  2 VIEW

[PA]
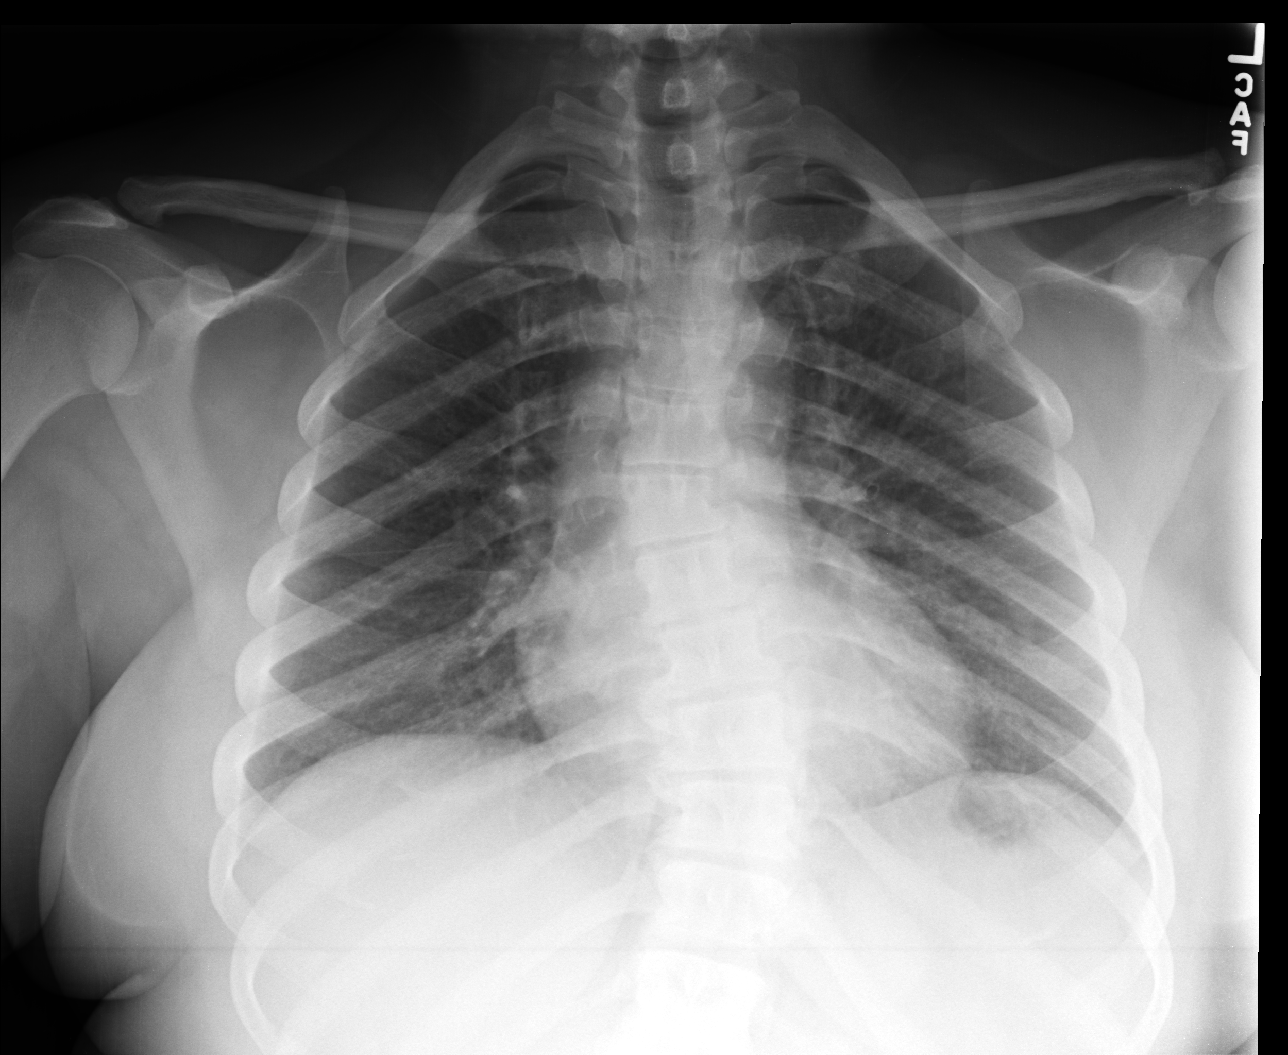

[lateral (1 of 2)]
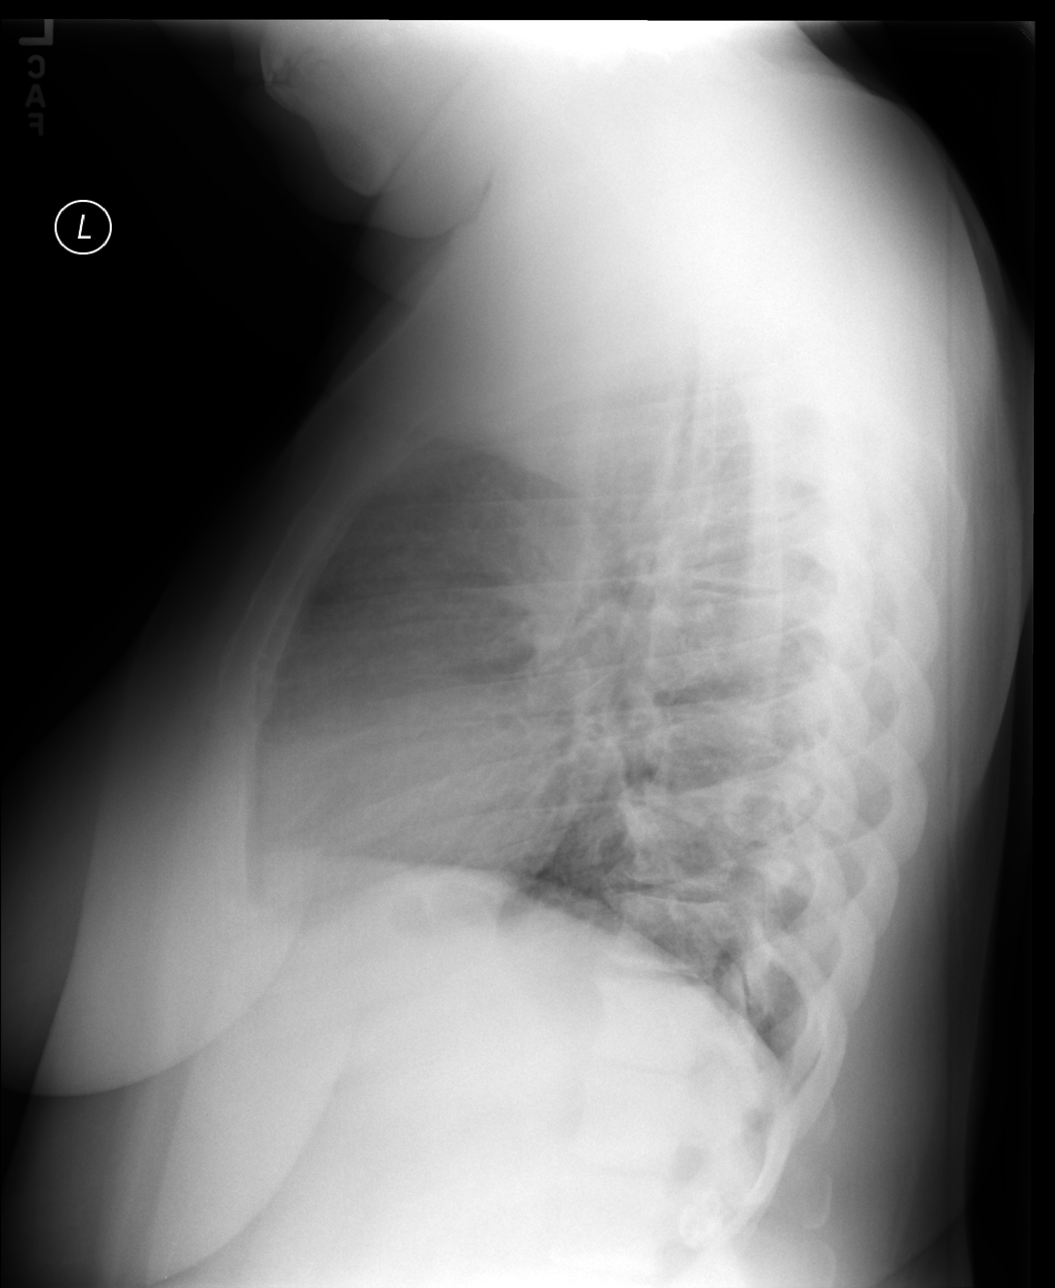

[lateral (2 of 2)]
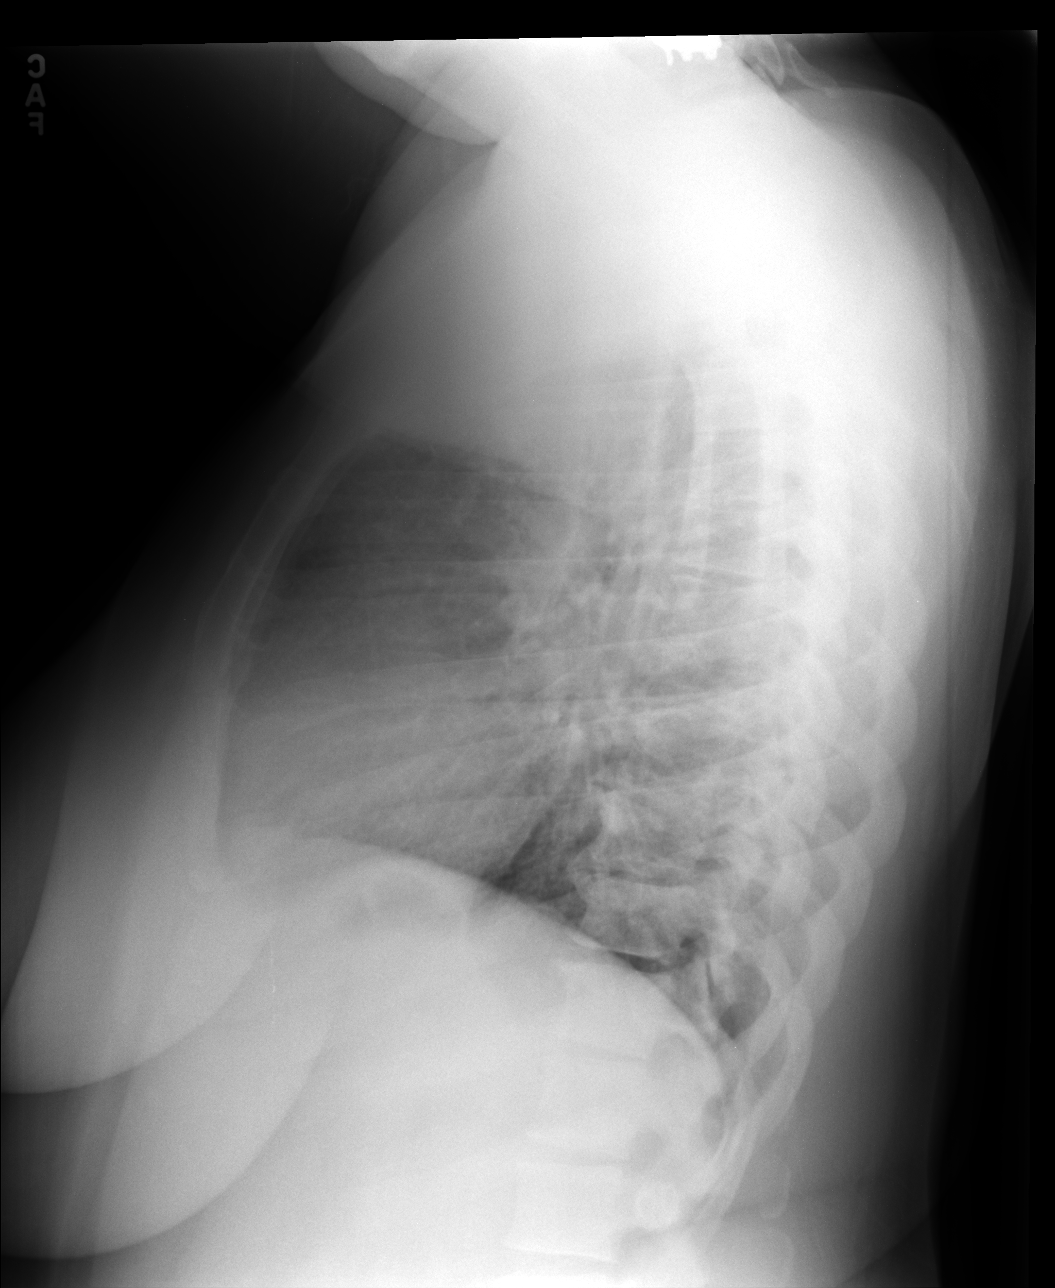

[3 of 3 positions shown; findings below may reference images not displayed]

FINDINGS: The cardiomediastinal silhouette is unremarkable.

Mild peribronchial thickening is unchanged.

There is no evidence of focal airspace disease, pulmonary edema,
suspicious pulmonary nodule/mass, pleural effusion, or pneumothorax.
No acute bony abnormalities are identified.

A thoracic scoliosis is again noted.
IMPRESSION: No evidence of acute cardiopulmonary disease.

Chronic peribronchial thickening.

## 2015-11-29 DIAGNOSIS — R87612 Low grade squamous intraepithelial lesion on cytologic smear of cervix (LGSIL): Secondary | ICD-10-CM | POA: Insufficient documentation

## 2015-12-06 LAB — CYTOLOGY - PAP
HPV DNA: POSITIVE — AB
PAP SMEAR: ABNORMAL — AB

## 2016-01-23 ENCOUNTER — Encounter: Payer: Self-pay | Admitting: *Deleted

## 2016-01-23 ENCOUNTER — Ambulatory Visit (INDEPENDENT_AMBULATORY_CARE_PROVIDER_SITE_OTHER): Payer: BLUE CROSS/BLUE SHIELD | Admitting: Obstetrics & Gynecology

## 2016-01-23 ENCOUNTER — Institutional Professional Consult (permissible substitution): Payer: BLUE CROSS/BLUE SHIELD | Admitting: Obstetrics & Gynecology

## 2016-01-23 ENCOUNTER — Encounter: Payer: Self-pay | Admitting: Obstetrics & Gynecology

## 2016-01-23 VITALS — BP 115/74 | HR 95 | Ht 62.0 in | Wt 239.0 lb

## 2016-01-23 DIAGNOSIS — R87612 Low grade squamous intraepithelial lesion on cytologic smear of cervix (LGSIL): Secondary | ICD-10-CM | POA: Diagnosis not present

## 2016-01-23 DIAGNOSIS — Z712 Person consulting for explanation of examination or test findings: Secondary | ICD-10-CM

## 2016-01-23 NOTE — Patient Instructions (Signed)
Return to clinic for any scheduled appointments or for any gynecologic concerns as needed.    Thank you for enrolling in [place]. Please follow the instructions below to securely access your online medical record. MyChart allows you to send messages to your doctor, view your test results, renew your prescriptions, schedule appointments, and more.  How Do I Sign Up? 1. In your Internet browser, go to [email or web address]. 2. Click on the New  User? link in the Sign In box.  3. Enter your MyChart Access Code exactly as it appears below. You will not need to use this code after you have completed the sign-up process. If you do not sign up before the expiration date, you must request a new code. MyChart Access Code: [email or web address] Expires: 01/27/2016  9:34 AM  4. Enter the last four digits of your Social Security Number (xxxx) and Date of Birth (mm/dd/yyyy) as indicated and click Next. You will be taken to the next sign-up page. 5. Create a MyChart ID. This will be your MyChart login ID and cannot be changed, so think of one that is secure and easy to remember. 6. Create a MyChart password. You can change your password at any time. 7. Enter your Password Reset Question and Answer and click Next. This can be used at a later time if you forget your password.  8. Select your communication preference, and if applicable enter your e-mail address. You will receive e-mail notification when new information is available in MyChart by choosing to receive e-mail notifications and filling in your e-mail. 9. Click Sign In. You can now view your medical record.   Additional Information If you have questions, you can email [email or web address] or call [phone] to talk to our [place] staff. Remember, MyChart is NOT to be used for urgent needs. For medical emergencies, dial 911.

## 2016-01-23 NOTE — Progress Notes (Signed)
   CLINIC ENCOUNTER NOTE  History:  40 y.o. G1P0 here today for discussion of colposcopy results; she had a LGSIL pap in 11/29/15 and follow up colposcopy on 12/21/15.   She denies any abnormal vaginal discharge, bleeding, pelvic pain or other concerns.   Past Medical History  Diagnosis Date  . Bilateral cellulitis of lower leg   . Anemia   . Strep throat 1977; winter 2016  . Hypothyroidism   . Scleroderma ([place])     "fingers" (07/27/2015)  . Glomerulonephritis     Archie Endo 07/25/2015  . Hypertension     "just starting" (07/27/2015)  . Type II diabetes mellitus ([place]) dx'd 09/2014    Past Surgical History  Procedure Laterality Date  . Renal biopsy Left 07/27/2015    US guided medical renal biopsy    The following portions of the patient's history were reviewed and updated as appropriate: allergies, current medications, past family history, past medical history, past social history, past surgical history and problem list.   Review of Systems:  Pertinent items noted in HPI and remainder of comprehensive ROS otherwise negative.  Objective:  Physical Exam BP 115/74 mmHg  Pulse 95  Ht 5\' 2"  (1.575 m)  Wt 239 lb (108.41 kg)  BMI 43.70 kg/m2 CONSTITUTIONAL: Well-developed, well-nourished female in no acute distress.  HENT:  Normocephalic, atraumatic. External right and left ear normal. Oropharynx is clear and moist EYES: Conjunctivae and EOM are normal. Pupils are equal, round, and reactive to light. No scleral icterus.  NECK: Normal range of motion, supple, no masses SKIN: Skin is warm and dry. No rash noted. Not diaphoretic. No erythema. No pallor. NEUROLOGIC: Alert and oriented to person, place, and time.  PSYCHIATRIC: Normal mood and affect.  Normal behavior. Normal judgment and thought content. CARDIOVASCULAR: Normal heart rate noted RESPIRATORY: Effort and breath sounds normal, no problems with respiration noted ABDOMEN: Soft, no distention noted.   PELVIC:  Deferred MUSCULOSKELETAL: Normal range of motion. No edema noted.  Labs and Imaging Colposcopy on 12/21/15: Benign pathology  Assessment & Plan:  1. Encounter to discuss test results 2. Low grade squamous intraepithelial lesion (LGSIL) on cervical Pap smear Results discussed with patient Will repeat cotesting in 12 months. Return in about 11 months (around 12/24/2016) for Follow up pap.   Total face-to-face time with patient: 10 minutes. Over 50% of encounter was spent on counseling and coordination of care.   Verita Schneiders, MD, [place] Attending [place], [facility] for [employer], [place]

## 2016-01-24 ENCOUNTER — Ambulatory Visit: Payer: BLUE CROSS/BLUE SHIELD | Admitting: Internal Medicine

## 2016-02-18 ENCOUNTER — Ambulatory Visit (INDEPENDENT_AMBULATORY_CARE_PROVIDER_SITE_OTHER): Payer: BLUE CROSS/BLUE SHIELD | Admitting: Family Medicine

## 2016-02-18 ENCOUNTER — Ambulatory Visit (INDEPENDENT_AMBULATORY_CARE_PROVIDER_SITE_OTHER): Payer: BLUE CROSS/BLUE SHIELD

## 2016-02-18 ENCOUNTER — Encounter: Payer: Self-pay | Admitting: Family Medicine

## 2016-02-18 VITALS — BP 113/75 | HR 89 | Temp 98.1°F | Resp 20 | Ht 65.0 in | Wt 238.0 lb

## 2016-02-18 DIAGNOSIS — M79641 Pain in right hand: Secondary | ICD-10-CM

## 2016-02-18 MED ORDER — PREDNISONE 10 MG PO TABS: 30.0000 mg | ORAL_TABLET | Freq: Every day | ORAL | 0 refills | Status: DC

## 2016-02-18 MED ORDER — DICLOFENAC SODIUM 1 % TD GEL: 2.0000 g | Freq: Four times a day (QID) | TRANSDERMAL | 11 refills | Status: DC

## 2016-02-18 NOTE — Progress Notes (Signed)
Rebecca Mullins is a 40 y.o. female who presents to [facility] today for right hand pain. Patient notes a one-day history of pain in the right hand located at the second and third MCPs worse with full finger extension and with flexion. She denies any pain radiating to her fingers. She does note a numb tingling sensation radiating from her wrist to her arm area this is worse at night. This is a new finding as well. She denies any neck pain or injury. She denies any change in activity or medications. No fevers or chills. She is a pertinent medical history for nephrotic syndrome, scleroderma, and diabetes. He's been told that she is not allowed to take NSAIDs due to her kidney disease. 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 She denies any significant change in activity.   Past Medical History:  Diagnosis Date  . Anemia   . Bilateral cellulitis of lower leg   . Glomerulonephritis    Archie Endo 07/25/2015  . Hypertension    "just starting" (07/27/2015)  . Hypothyroidism   . Scleroderma ([place])    "fingers" (07/27/2015)  . Strep throat 1977; winter 2016  . Type II diabetes mellitus ([place]) dx'd 09/2014   Past Surgical History:  Procedure Laterality Date  . RENAL BIOPSY Left 07/27/2015   US guided medical renal biopsy   Social History  Substance Use Topics  . Smoking status: Former Smoker    Packs/day: 1.00    Years: 2.00    Types: Cigarettes    Start date: 02/28/1993    Quit date: 03/01/1995  . Smokeless tobacco: Never Used  . Alcohol use Yes     Comment: 07/27/2015 "might have a couple glasses of beer or wine q couple weeks"   ROS as above Medications: Current Outpatient Prescriptions  Medication Sig Dispense Refill  . amLODipine (NORVASC) 10 MG tablet Take 10 mg by mouth daily.    Marland Kitchen levothyroxine (SYNTHROID, LEVOTHROID) 75 MCG tablet Take 75 mcg by mouth daily before breakfast.    . medroxyPROGESTERone (DEPO-PROVERA) 150 MG/ML injection Inject 1 mL (150 mg total) into the muscle every 3  (three) months. 1 mL 1  . Prenat-FeFum-FePo-FA-Omega 3 (CONCEPT DHA) 53.5-38-1 MG CAPS Take 1 capsule by mouth daily.    Marland Kitchen spironolactone (ALDACTONE) 50 MG tablet Reported on 01/23/2016  1  . diclofenac sodium (VOLTAREN) 1 % GEL Apply 2 g topically 4 (four) times daily. To affected joint. 100 g 11  . predniSONE (DELTASONE) 10 MG tablet Take 3 tablets (30 mg total) by mouth daily with breakfast. 15 tablet 0   No current facility-administered medications for this visit.    Allergies  Allergen Reactions  . Penicillins Anaphylaxis    Has patient had a PCN reaction causing immediate rash, facial/tongue/throat swelling, SOB or lightheadedness with hypotension: Yes Has patient had a PCN reaction causing severe rash involving mucus membranes or skin necrosis: No Has patient had a PCN reaction that required hospitalization No Has patient had a PCN reaction occurring within the last 10 years: No If all of the above answers are "NO", then may proceed with Cephalosporin use.      Exam:  BP 113/75   Pulse 89   Temp 98.1 F (36.7 C) (Oral)   Resp 20   Ht 5\' 5"  (1.651 m)   Wt 238 lb (108 kg)   BMI 39.61 kg/m  Gen: Well NAD nontoxic appearing HEENT: EOMI,  MMM Lungs: Normal work of breathing. CTABL Heart: RRR no MRG  Abd: NABS, Soft. Nondistended, Nontender Exts: Brisk capillary refill, warm and well perfused.  Neck: Nontender to spinal midline normal neck motion negative Spurling's test bilaterally. Negative Tinel's right elbow Positive Tinel's carpal tunnel with paresthesias radiating proximally up the arm. No symptom radiating to the hand. Right hand normal-appearing. Mildly tender palpation palmar second and third MCPs. Pain with full and extension. However motion is normal. Slight decrease in grip strength second and third digits due to pain. No triggering. Capillary refill pulses and sensation are intact distally.   No results found for this or any previous visit (from the past 24  hour(s)). Dg Hand Complete Right  Result Date: 02/18/2016 CLINICAL DATA:  Pt Had interrupted sleep due to pain and "heartbeat" feeling In middle and 4th fingers andstates woke up this AM and RT middle and 4th(ring) finger won't bend and is still painful NCOP shielded EXAM: RIGHT HAND - COMPLETE 3+ VIEW COMPARISON:  None. FINDINGS: There is no evidence of fracture or dislocation. There is no evidence of arthropathy or other focal bone abnormality. Soft tissues are unremarkable. IMPRESSION: Negative. Electronically Signed   By: Lucrezia Europe M.D.   On: 02/18/2016 12:00    Assessment and Plan: 40 y.o. female with synovitis or mild nontraumatic tenosynovitis. Over the etiology is somewhat unclear. This is probably an overuse issue. Treat with short course of prednisone and diclofenac gel. Avoid systemic NSAIDs due to nephrotic syndrome. Return if not improved.  Discussed warning signs or symptoms. Please see discharge instructions. Patient expresses understanding.

## 2016-02-18 NOTE — Patient Instructions (Addendum)
Thank you for coming in today. Take prednisone for 5 days. Use diclofenac gel as needed for pain up to 4 times daily. Return as needed.      IF you received an x-ray today, you will receive an invoice from [facility]. Please contact [facility] at [phone] with questions or concerns regarding your invoice.   IF you received labwork today, you will receive an invoice from [employer]. Please contact [facility] at [phone] with questions or concerns regarding your invoice.   Our billing staff will not be able to assist you with questions regarding bills from these companies.  You will be contacted with the lab results as soon as they are available. The fastest way to get your results is to activate your My Chart account. Instructions are located on the last page of this paperwork. If you have not heard from [place] regarding the results in 2 weeks, please contact this office.

## 2016-02-22 ENCOUNTER — Telehealth: Payer: Self-pay

## 2016-02-22 NOTE — Telephone Encounter (Addendum)
PA completed on covermymeds for diclofenac gel. Pending. Pt can not use oral NSAIDS d/t kidney disease. PA was approved through 07/08/2038. Notified pharm.

## 2016-02-28 ENCOUNTER — Encounter: Payer: Self-pay | Admitting: Oncology

## 2016-03-03 ENCOUNTER — Other Ambulatory Visit: Payer: Self-pay | Admitting: Physician Assistant

## 2016-03-15 ENCOUNTER — Ambulatory Visit: Payer: BLUE CROSS/BLUE SHIELD | Admitting: Oncology

## 2016-03-15 ENCOUNTER — Other Ambulatory Visit: Payer: Self-pay | Admitting: *Deleted

## 2016-03-15 ENCOUNTER — Telehealth: Payer: Self-pay | Admitting: Oncology

## 2016-03-15 NOTE — Telephone Encounter (Signed)
Pt called in to reschedule 2pm new patient appt due to car problems, changed appt to 9/15 at 11pm

## 2016-03-23 ENCOUNTER — Ambulatory Visit (HOSPITAL_BASED_OUTPATIENT_CLINIC_OR_DEPARTMENT_OTHER): Payer: BLUE CROSS/BLUE SHIELD | Admitting: Oncology

## 2016-03-23 VITALS — BP 127/86 | HR 98 | Temp 98.4°F | Resp 18 | Ht 65.0 in | Wt 233.9 lb

## 2016-03-23 DIAGNOSIS — N289 Disorder of kidney and ureter, unspecified: Secondary | ICD-10-CM

## 2016-03-23 DIAGNOSIS — D509 Iron deficiency anemia, unspecified: Secondary | ICD-10-CM | POA: Diagnosis not present

## 2016-03-23 DIAGNOSIS — I890 Lymphedema, not elsewhere classified: Secondary | ICD-10-CM

## 2016-03-23 DIAGNOSIS — D479 Neoplasm of uncertain behavior of lymphoid, hematopoietic and related tissue, unspecified: Secondary | ICD-10-CM

## 2016-03-23 DIAGNOSIS — J849 Interstitial pulmonary disease, unspecified: Secondary | ICD-10-CM

## 2016-03-23 NOTE — Progress Notes (Signed)
Reason for Referral: Evaluation for lymphoproliferative disorder.   HPI: 40 year old woman currently of [place] where she lived for the last 20 years. 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 She also developed chronic lower extremity edema and found to have interstitial lung disease in December 2016. She subsequently developed renal insufficiency and underwent a renal biopsy on January 2017. 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 Evaluation for lymphoproliferative disorder was recommended because of these findings. Clinically, she reports doing reasonably well and continues to work full time. She is asymptomatic with lower extremity edema, dyspnea on exertion and fatigue. She was diagnosed with iron deficiency anemia and currently taking iron supplements. Her autoimmune workup did reveal a positive ANA with nuclear homogeneous pattern. She was diagnosed with scleroderma and offered CellCept but she declined therapy because of fear of side effects.  She denies any headaches blurry vision, syncope or seizures. She does not report any fevers, chills or sweats. Her appetite appears unchanged. She does not report any chest pain, palpitation but does report leg edema. She does report cough but no wheezing or hemoptysis. She does not report any nausea, vomiting or abdominal pain. She does not report any frequency urgency or hesitancy. She does not report any skeletal complaints.  Remaining review of systems unremarkable.   Past Medical History:  Diagnosis Date  . Anemia   . Bilateral cellulitis of lower leg   . Glomerulonephritis    [facility] 07/25/2015  . Hypertension    "just starting" (07/27/2015)  . Hypothyroidism   . Scleroderma ([place])    "fingers" (07/27/2015)  . Strep throat 1977; winter 2016  . Type II diabetes mellitus ([place]) dx'd 09/2014  :  Past  Surgical History:  Procedure Laterality Date  . RENAL BIOPSY Left 07/27/2015   US guided medical renal biopsy  :   Current Outpatient Prescriptions:  .  amLODipine (NORVASC) 10 MG tablet, Take 10 mg by mouth daily., Disp: , Rfl:  .  levothyroxine (SYNTHROID, LEVOTHROID) 75 MCG tablet, Take 75 mcg by mouth daily before breakfast., Disp: , Rfl:  .  medroxyPROGESTERone (DEPO-PROVERA) 150 MG/ML injection, INJECT 1 ML (150 MG TOTAL) INTO THE MUSCLE EVERY 3 MONTHS, Disp: 1 mL, Rfl: 1 .  ONGLYZA 2.5 MG TABS tablet, Take 2.5 mg by mouth at bedtime., Disp: , Rfl: 0 .  pravastatin (PRAVACHOL) 20 MG tablet, Take 20 mg by mouth at bedtime., Disp: , Rfl: 0 .  Prenat-FeFum-FePo-FA-Omega 3 (CONCEPT DHA) 53.5-38-1 MG CAPS, Take 1 capsule by mouth daily., Disp: , Rfl:  .  spironolactone (ALDACTONE) 50 MG tablet, Reported on 01/23/2016, Disp: , Rfl: 1:  Allergies  Allergen Reactions  . Penicillins Anaphylaxis    Has patient had a PCN reaction causing immediate rash, facial/tongue/throat swelling, SOB or lightheadedness with hypotension: Yes Has patient had a PCN reaction causing severe rash involving mucus membranes or skin necrosis: No Has patient had a PCN reaction that required hospitalization No Has patient had a PCN reaction occurring within the last 10 years: No If all of the above answers are "NO", then may proceed with Cephalosporin use.   :  Family History  Problem Relation Age of Onset  . Diabetes Mother   . Cancer Mother 45    breast  . Hyperlipidemia Father   . Diabetes Brother   :  Social History   Social History  . Marital status: Single  Spouse name: N/A  . Number of children: N/A  . Years of education: N/A   Occupational History  . Not on file.   Social History Main Topics  . Smoking status: Former Smoker    Packs/day: 1.00    Years: 2.00    Types: Cigarettes    Start date: 02/28/1993    Quit date: 03/01/1995  . Smokeless tobacco: Never Used  . Alcohol use Yes      Comment: 07/27/2015 "might have a couple glasses of beer or wine q couple weeks"  . Drug use: No  . Sexual activity: Yes    Birth control/ protection: None   Other Topics Concern  . Not on file   Social History Narrative  . No narrative on file  :  Pertinent items are noted in HPI.  Exam: Blood pressure 127/86, pulse 98, temperature 98.4 F (36.9 C), temperature source Oral, resp. rate 18, height '5\' 5"'$  (1.651 m), weight 233 lb 14.4 oz (106.1 kg), SpO2 100 %, unknown if currently breastfeeding.  ECOG 1 General appearance: alert and cooperativeAppeared without distress. Head: Normocephalic, without obvious abnormality Throat: lips, mucosa, and tongue normal; teeth and gums normal Neck: no adenopathy or thyroid masses. Back: negative Resp: Rhonchi and wheezes noted throughout her lung fields. Chest wall: no tenderness Cardio: regular rate and rhythm, S1, S2 normal, no murmur, click, rub or gallop GI: soft, non-tender; bowel sounds normal; no masses,  no organomegaly Extremities: extremities normal, atraumatic, no cyanosis or edema   Lymphatic Examination: Showed no lymphadenopathy in the inguinal, axillary or supraclavicular area.   CBC    Component Value Date/Time   WBC 8.5 07/28/2015 0755   RBC 4.19 07/28/2015 0755   HGB 11.8 (L) 07/28/2015 0755   HCT 35.3 (L) 07/28/2015 0755   PLT 379 07/28/2015 0755   MCV 84.2 07/28/2015 0755   MCH 28.2 07/28/2015 0755   MCHC 33.4 07/28/2015 0755   RDW 13.7 07/28/2015 0755   LYMPHSABS 3.8 04/21/2012 2130   MONOABS 1.0 04/21/2012 2130   EOSABS 0.2 04/21/2012 2130   BASOSABS 0.0 04/21/2012 2130      Chemistry      Component Value Date/Time   NA 136 01/19/2013 1619   K 4.2 01/19/2013 1619   CL 103 01/19/2013 1619   CO2 23 01/19/2013 1619   BUN 9 01/19/2013 1619   CREATININE 0.87 01/19/2013 1619      Component Value Date/Time   CALCIUM 9.8 01/19/2013 1619   ALKPHOS 83 01/19/2013 1619   AST 18 01/19/2013 1619   ALT 15  01/19/2013 1619   BILITOT 0.4 01/19/2013 1619    IMPRESSION: 1. Extensive ground-glass attenuation throughout the peribronchovascular and peripheral portions of both lungs, with areas of sparing in the immediate subpleural regions of the lungs. No significant regions of subpleural reticulation, traction bronchiectasis or frank honeycombing. 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 Given the slightly patulous fluid-filled thoracic esophagus, consider scleroderma. 2. Mild to moderate mediastinal and likely mild bilateral hilar lymphadenopathy, nonspecific, likely reactive. 3. Two-vessel coronary atherosclerosis. 4. Two tiny subpleural pulmonary nodules in the upper lobes, largest 4 mm, probably benign. Given risk factors for bronchogenic carcinoma, follow-up chest CT at 1 year is recommended. This recommendation follows the consensus statement: Guidelines for Management of Small Pulmonary Nodules Detected on CT Scans: A Statement from the [employer] as published in Radiology 2005; 237:395-400.   Assessment and Plan:   40 year old woman with the following issues:  1. Evaluation for lymphoproliferative  disorder: I see no evidence to suggest lymphoproliferative disorder in her. Her laboratory data including CBC on multiple occasions showed normal white cell count, lymphocyte percentage as well as normal differential. She had a CT scan of the chest on 07/08/2015 that showed no lymphadenopathy. Her physical examination does not reveal any lymphadenopathy.  I see no need for further imaging studies, bone marrow biopsy or any other evaluation. The likelihood of a lymphoproliferative disorder is very low at this time.  2. 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 She is status post biopsy which showed an usual glomerulonephropathy. These cryoglobulins were negative and evaluation for lymphoproliferative  disorder was recommended and no evidence of this detected at this time. She continues to follow with nephrology regarding this issue.  3. Interstitial lung disease: She follows up with pulmonary medicine regarding this issue.  4. Iron deficiency anemia: She is on iron supplements and tolerating it well. Her last hemoglobin was close to baseline between 10.6 and 11.1.  5. Follow-up: I'm happy to see her in the future as needed.

## 2016-03-26 DIAGNOSIS — E119 Type 2 diabetes mellitus without complications: Secondary | ICD-10-CM | POA: Insufficient documentation

## 2016-03-26 DIAGNOSIS — M542 Cervicalgia: Secondary | ICD-10-CM | POA: Insufficient documentation

## 2016-03-26 DIAGNOSIS — E039 Hypothyroidism, unspecified: Secondary | ICD-10-CM | POA: Insufficient documentation

## 2016-03-26 DIAGNOSIS — I10 Essential (primary) hypertension: Secondary | ICD-10-CM | POA: Insufficient documentation

## 2016-03-26 DIAGNOSIS — Z79899 Other long term (current) drug therapy: Secondary | ICD-10-CM | POA: Diagnosis not present

## 2016-03-26 DIAGNOSIS — Y939 Activity, unspecified: Secondary | ICD-10-CM | POA: Insufficient documentation

## 2016-03-26 DIAGNOSIS — Z5321 Procedure and treatment not carried out due to patient leaving prior to being seen by health care provider: Secondary | ICD-10-CM | POA: Insufficient documentation

## 2016-03-26 DIAGNOSIS — Y999 Unspecified external cause status: Secondary | ICD-10-CM | POA: Diagnosis not present

## 2016-03-26 DIAGNOSIS — W010XXA Fall on same level from slipping, tripping and stumbling without subsequent striking against object, initial encounter: Secondary | ICD-10-CM | POA: Diagnosis not present

## 2016-03-26 DIAGNOSIS — M545 Low back pain: Secondary | ICD-10-CM | POA: Diagnosis present

## 2016-03-26 DIAGNOSIS — Z87891 Personal history of nicotine dependence: Secondary | ICD-10-CM | POA: Insufficient documentation

## 2016-03-26 DIAGNOSIS — Y929 Unspecified place or not applicable: Secondary | ICD-10-CM | POA: Diagnosis not present

## 2016-03-26 NOTE — ED Triage Notes (Signed)
Pt states that she fell today after slipping in water. States she now has lower back pain and neck pain. Alert and oriented. Denies head injury.

## 2016-03-27 ENCOUNTER — Emergency Department (HOSPITAL_COMMUNITY)
Admission: EM | Admit: 2016-03-27 | Discharge: 2016-03-27 | Disposition: A | Discharge status: Home / Self Care | Payer: BLUE CROSS/BLUE SHIELD | Attending: Dermatology | Admitting: Dermatology

## 2016-03-27 NOTE — ED Notes (Signed)
Pt called from triage, no answer 

## 2016-04-25 ENCOUNTER — Telehealth: Payer: Self-pay | Admitting: Internal Medicine

## 2016-04-25 NOTE — Telephone Encounter (Signed)
Spoke with Dr. Kandace Blitz who states pt is currently admitted and is requesting CT & PFT results.  I have faxed these over and have received an confirmation. Nothing further needed.

## 2016-05-22 ENCOUNTER — Telehealth: Payer: Self-pay | Admitting: Radiology

## 2016-05-22 NOTE — Telephone Encounter (Signed)
Patient called her to report nose bleed, and states she is unable to hold her urine after starting Cellcept. After conversation with patient it became evident patient is very short of breath. I have advised her to go directly to the emergency room and I made sure she has someone with her. She did tell me she has someone with her who can stay with her until she is evaluated in the emergency room . I did also advise her to call [facility] who started her on the Cellcept and make sure they are aware of her current situation.   To you FYI

## 2016-05-30 ENCOUNTER — Encounter (HOSPITAL_COMMUNITY): Payer: Self-pay | Admitting: Emergency Medicine

## 2016-05-30 ENCOUNTER — Ambulatory Visit (HOSPITAL_COMMUNITY)
Admission: EM | Admit: 2016-05-30 | Discharge: 2016-05-30 | Disposition: A | Discharge status: Home / Self Care | Payer: BLUE CROSS/BLUE SHIELD | Attending: Emergency Medicine | Admitting: Emergency Medicine

## 2016-05-30 ENCOUNTER — Telehealth: Payer: Self-pay | Admitting: Radiology

## 2016-05-30 DIAGNOSIS — L03012 Cellulitis of left finger: Secondary | ICD-10-CM | POA: Diagnosis not present

## 2016-05-30 DIAGNOSIS — I7300 Raynaud's syndrome without gangrene: Secondary | ICD-10-CM | POA: Diagnosis not present

## 2016-05-30 MED ORDER — DOXYCYCLINE HYCLATE 100 MG PO CAPS: 100.0000 mg | ORAL_CAPSULE | Freq: Two times a day (BID) | ORAL | 0 refills | Status: AC

## 2016-05-30 MED ORDER — NIFEDIPINE ER OSMOTIC RELEASE 30 MG PO TB24: 30.0000 mg | ORAL_TABLET | Freq: Every day | ORAL | 0 refills | Status: DC

## 2016-05-30 MED ORDER — HYDROCODONE-ACETAMINOPHEN 5-325 MG PO TABS: 1.0000 | ORAL_TABLET | Freq: Four times a day (QID) | ORAL | 0 refills | Status: DC | PRN

## 2016-05-30 NOTE — ED Triage Notes (Signed)
11/09-rheumatologist prescribed nitr cream.  Did not get script right away.  Kidney specialist saw finger last week 11/15 and told patient to use the cream prescribed.    Patient has used nitro cream three times.  Patient reports hand burns and feels like it is going to "burst"  Otherwise, index finger is scaly and discolored.  Finger hurts and is itching.  Patient has fake nails on all fingers and says it is time to remove them, but she is scared to remove nails.

## 2016-05-30 NOTE — Telephone Encounter (Addendum)
I have gotten correspondence from [facility] concerning patient. She has walked in today with c/o pain with her hands. You have referred her to [facility], she is holding her hand, and can not move it. She has VERY complicated medical history. I have directed her to the emergency room. She has already established care at [facility], which is where she needs to continue, I want to discuss this with you when we get a chance. I have put notes from [facility] on your desk.

## 2016-05-30 NOTE — ED Provider Notes (Signed)
[facility]    CSN: [id number] Arrival date & time: 05/30/16  1006     History   Chief Complaint Chief Complaint  Patient presents with  . Hand Pain    HPI Rebecca Mullins is a 40 y.o. female.   HPI  She is a 40 year old woman here for evaluation of left index finger pain. She has a history of scleroderma with associated Raynaud's.  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  She is here today for left index finger pain. She reports throbbing discomfort in the distal finger. It radiates to the base of the finger. She has seen some drainage under the nail. She does have chronic color change and ulceration from the Raynaud's. No fevers.  Past Medical History:  Diagnosis Date  . Anemia   . Bilateral cellulitis of lower leg   . Glomerulonephritis    [facility] Endo 07/25/2015  . Hypertension    "just starting" (07/27/2015)  . Hypothyroidism   . Scleroderma ([place])    "fingers" (07/27/2015)  . Strep throat 1977; winter 2016  . Type II diabetes mellitus ([place]) dx'd 09/2014    Patient Active Problem List   Diagnosis Date Noted  . Low grade squamous intraepithelial lesion (LGSIL) on cervical Pap smear 11/29/2015  . Scleroderma ([place]) 10/11/2015  . ILD (interstitial lung disease) ([place]) 07/28/2015  . Proteinuria   . Nephrotic syndrome 07/22/2015  . Dyspnea and respiratory abnormality 06/29/2015  . 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 Obesity 06/29/2015  . Venous insufficiency of both lower extremities   . Lymphedema   . Lipodermatosclerosis   . Edema 01/19/2013  . Cellulitis 01/19/2013    Past Surgical History:  Procedure Laterality Date  . RENAL BIOPSY Left 07/27/2015   US guided medical renal biopsy    OB History    Gravida Para Term Preterm AB Living   1             SAB TAB Ectopic Multiple Live Births                   Home Medications    Prior to Admission medications   Medication Sig Start Date End Date Taking? Authorizing  Provider  levofloxacin (LEVAQUIN) 500 MG tablet Take 500 mg by mouth daily.   Yes Historical Provider, MD  mycophenolate (CELLCEPT) 500 MG tablet Take by mouth 2 (two) times daily.   Yes Historical Provider, MD  nitroGLYCERIN (NITROGLYN) 2 % ointment Apply topically 4 (four) times daily.   Yes Historical Provider, MD  predniSONE (DELTASONE) 1 MG tablet Take 30 mg by mouth daily with breakfast. This is a taper   Yes Historical Provider, MD  doxycycline (VIBRAMYCIN) 100 MG capsule Take 1 capsule (100 mg total) by mouth 2 (two) times daily. 05/30/16 06/06/16  Melony Overly, MD  HYDROcodone-acetaminophen (NORCO) 5-325 MG tablet Take 1 tablet by mouth every 6 (six) hours as needed for moderate pain. 05/30/16   Melony Overly, MD  levothyroxine (SYNTHROID, LEVOTHROID) 75 MCG tablet Take 75 mcg by mouth daily before breakfast.    Historical Provider, MD  medroxyPROGESTERone (DEPO-PROVERA) 150 MG/ML injection INJECT 1 ML (150 MG TOTAL) INTO THE MUSCLE EVERY 3 MONTHS 03/05/16   Dorian Heckle English, PA  NIFEdipine (PROCARDIA-XL/ADALAT-CC/NIFEDICAL-XL) 30 MG 24 hr tablet Take 1 tablet (30 mg total) by mouth daily. 05/30/16   Melony Overly, MD  ONGLYZA 2.5 MG TABS tablet Take 2.5 mg by mouth at bedtime. 03/09/16   Historical Provider,  MD  pravastatin (PRAVACHOL) 20 MG tablet Take 20 mg by mouth at bedtime. 03/09/16   Historical Provider, MD  Prenat-FeFum-FePo-FA-Omega 3 (CONCEPT DHA) 53.5-38-1 MG CAPS Take 1 capsule by mouth daily.    Historical Provider, MD    Family History Family History  Problem Relation Age of Onset  . Diabetes Mother   . Cancer Mother 37    breast  . Hyperlipidemia Father   . Diabetes Brother     Social History Social History  Substance Use Topics  . Smoking status: Former Smoker    Packs/day: 1.00    Years: 2.00    Types: Cigarettes    Start date: 02/28/1993    Quit date: 03/01/1995  . Smokeless tobacco: Never Used  . Alcohol use Yes     Comment: 07/27/2015 "might have a couple  glasses of beer or wine q couple weeks"     Allergies   Penicillins and Nsaids   Review of Systems Review of Systems As in history of present illness  Physical Exam Triage Vital Signs ED Triage Vitals [05/30/16 1036]  Enc Vitals Group     BP      Pulse      Resp      Temp      Temp src      SpO2      Weight      Height      Head Circumference      Peak Flow      Pain Score 10     Pain Loc      Pain Edu?      Excl. in [place]?    No data found.   Updated Vital Signs BP 106/83 (BP Location: Right Arm)   Pulse 95   Temp 97.7 F (36.5 C) (Oral)   Resp 22   SpO2 98%   Visual Acuity Right Eye Distance:   Left Eye Distance:   Bilateral Distance:    Right Eye Near:   Left Eye Near:    Bilateral Near:     Physical Exam  Constitutional: She is oriented to person, place, and time. She appears well-developed and well-nourished. No distress.  Cardiovascular: Normal rate.   Pulmonary/Chest: Effort normal.  Neurological: She is alert and oriented to person, place, and time.  Skin:  Left index finger: Distal finger is somewhat dusky in appearance. Refill is delayed at 3-4 seconds. She does have some ulceration at the very tip of the finger. There is additionally some purulent material coming from under the nail. No focal abscess seen.     UC Treatments / Results  Labs (all labs ordered are listed, but only abnormal results are displayed) Labs Reviewed - No data to display  EKG  EKG Interpretation None       Radiology No results found.  Procedures Procedures (including critical care time)  Medications Ordered in UC Medications - No data to display   Initial Impression / Assessment and Plan / UC Course  I have reviewed the triage vital signs and the nursing notes.  Pertinent labs & imaging results that were available during my care of the patient were reviewed by me and considered in my medical decision making (see chart for details).  Clinical Course      Warm soaks and doxycycline for infection. Trial of nifedipine to help with Raynaud's. Prescription for Vicodin given to use for pain. [employer] reviewed and negative.  Final Clinical Impressions(s) / UC Diagnoses   Final  diagnoses:  Raynaud's disease without gangrene  Paronychia of left index finger    New Prescriptions Discharge Medication List as of 05/30/2016 10:58 AM    START taking these medications   Details  doxycycline (VIBRAMYCIN) 100 MG capsule Take 1 capsule (100 mg total) by mouth 2 (two) times daily., Starting Wed 05/30/2016, Until Wed 06/06/2016, Normal    HYDROcodone-acetaminophen (NORCO) 5-325 MG tablet Take 1 tablet by mouth every 6 (six) hours as needed for moderate pain., Starting Wed 05/30/2016, Print    NIFEdipine (PROCARDIA-XL/ADALAT-CC/NIFEDICAL-XL) 30 MG 24 hr tablet Take 1 tablet (30 mg total) by mouth daily., Starting Wed 05/30/2016, Normal         Melony Overly, MD 05/30/16 1116

## 2016-05-30 NOTE — Telephone Encounter (Signed)
Thank you I have called her to advise. 

## 2016-05-30 NOTE — Discharge Instructions (Signed)
Your pain is from a combination of Raynaud's and infection. Take doxycycline twice a day for 7 days. Soak your finger in warm soapy water twice a day. Take the nifedipine daily for the next week. Use the hydrocodone every 4-6 hours as needed for severe pain. Do not drive while taking this medicine. Follow-up with your rheumatologist as needed.

## 2016-05-30 NOTE — Telephone Encounter (Signed)
I agree that she should follow up with [facility] rheumatology

## 2016-06-04 ENCOUNTER — Ambulatory Visit: Payer: BLUE CROSS/BLUE SHIELD | Admitting: Rheumatology

## 2016-06-29 ENCOUNTER — Ambulatory Visit (INDEPENDENT_AMBULATORY_CARE_PROVIDER_SITE_OTHER): Payer: BLUE CROSS/BLUE SHIELD

## 2016-06-29 ENCOUNTER — Ambulatory Visit (INDEPENDENT_AMBULATORY_CARE_PROVIDER_SITE_OTHER): Payer: BLUE CROSS/BLUE SHIELD | Admitting: Physician Assistant

## 2016-06-29 VITALS — BP 134/92 | Temp 98.7°F | Resp 18 | Ht 63.0 in | Wt 222.4 lb

## 2016-06-29 DIAGNOSIS — L089 Local infection of the skin and subcutaneous tissue, unspecified: Secondary | ICD-10-CM

## 2016-06-29 DIAGNOSIS — L03012 Cellulitis of left finger: Secondary | ICD-10-CM | POA: Diagnosis not present

## 2016-06-29 MED ORDER — DOXYCYCLINE HYCLATE 100 MG PO CAPS: 100.0000 mg | ORAL_CAPSULE | Freq: Two times a day (BID) | ORAL | 0 refills | Status: DC

## 2016-06-29 NOTE — Progress Notes (Signed)
Rebecca Mullins  MRN: [id number] DOB: [birth year]  Subjective:  Rebecca Mullins is a 40 y.o. female with a history of scleroderma, seen in office today for a chief complaint of right index finger infection x 4 weeks. States she went to get her nails done at a salon for the first time two months and about one month ago she noticed pain, swelling, and redness at the tip of her left index finger. She went to an urgent care and was diagnosed with a paronychia on 06/02/16 and was given 10 day prescription of keflex. States it did not get better so she went to another doctor and they gave her a 10 day prescription of doxycycline. She still did not have improvement so she went to  her PCP 9 days ago and was given another 10 day prescription of doxycycline without and relief. Notes she has never had a culture taken of the wound. She has associated pain.  Denies purulent discharge, loss of ROM, numbness, and tingling. Has tried tylenol and neosporin with no relief. Of note, pt is followed closely by both rheumatology and nephrology for her scleroderma. Her last creatinine was 3.4. Per pt, she has also been on a few days of Bactrim for this particular infection but her PCP took her off of it due to worsening kidney function.   Of note, it has been recommended by other urgent cares for pt to have I&D of this particular area; however, per pt, her rheumatologist told her that she cannot have I&D due to scleroderma as she could potentially lose the finger if I&D is performed.  Review of Systems  Constitutional: Negative for chills, diaphoresis and fever.  Gastrointestinal: Negative for nausea and vomiting.    Patient Active Problem List   Diagnosis Date Noted  . Low grade squamous intraepithelial lesion (LGSIL) on cervical Pap smear 11/29/2015  . Scleroderma ([place]) 10/11/2015  . ILD (interstitial lung disease) ([place]) 07/28/2015  . Proteinuria   . Nephrotic syndrome 07/22/2015  . Dyspnea and respiratory  abnormality 06/29/2015  . 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 Obesity 06/29/2015  . Venous insufficiency of both lower extremities   . Lymphedema   . Lipodermatosclerosis   . Edema 01/19/2013  . Cellulitis 01/19/2013    Current Outpatient Prescriptions on File Prior to Visit  Medication Sig Dispense Refill  . levothyroxine (SYNTHROID, LEVOTHROID) 75 MCG tablet Take 75 mcg by mouth daily before breakfast.    . medroxyPROGESTERone (DEPO-PROVERA) 150 MG/ML injection INJECT 1 ML (150 MG TOTAL) INTO THE MUSCLE EVERY 3 MONTHS 1 mL 1  . mycophenolate (CELLCEPT) 500 MG tablet Take by mouth 2 (two) times daily.    . predniSONE (DELTASONE) 1 MG tablet Take 30 mg by mouth daily with breakfast. This is a taper    . Prenat-FeFum-FePo-FA-Omega 3 (CONCEPT DHA) 53.5-38-1 MG CAPS Take 1 capsule by mouth daily.    Marland Kitchen NIFEdipine (PROCARDIA-XL/ADALAT-CC/NIFEDICAL-XL) 30 MG 24 hr tablet Take 1 tablet (30 mg total) by mouth daily. (Patient not taking: Reported on 06/29/2016) 30 tablet 0  . nitroGLYCERIN (NITROGLYN) 2 % ointment Apply topically 4 (four) times daily.    . ONGLYZA 2.5 MG TABS tablet Take 2.5 mg by mouth at bedtime.  0  . pravastatin (PRAVACHOL) 20 MG tablet Take 20 mg by mouth at bedtime.  0   No current facility-administered medications on file prior to visit.     Allergies  Allergen Reactions  . Penicillins Anaphylaxis    Has patient had a PCN reaction causing immediate  rash, facial/tongue/throat swelling, SOB or lightheadedness with hypotension: Yes Has patient had a PCN reaction causing severe rash involving mucus membranes or skin necrosis: No Has patient had a PCN reaction that required hospitalization No Has patient had a PCN reaction occurring within the last 10 years: No If all of the above answers are "NO", then may proceed with Cephalosporin use.   . Nsaids     Kidney issues     Objective:  BP (!) 134/92 (BP Location: Right Arm, Patient Position: Sitting, Cuff Size: Large)   Temp  98.7 F (37.1 C) (Oral)   Resp 18   Ht 5\' 3"  (1.6 m)   Wt 222 lb 6.4 oz (100.9 kg)   BMI 39.40 kg/m   Physical Exam  Constitutional: She is oriented to person, place, and time and well-developed, well-nourished, and in no distress.  HENT:  Head: Normocephalic and atraumatic.  Eyes: Conjunctivae are normal.  Neck: Normal range of motion.  Cardiovascular: Normal rate, regular rhythm and normal heart sounds.   Pulmonary/Chest: Effort normal.  Neurological: She is alert and oriented to person, place, and time. Gait normal.  Skin: Skin is warm and dry.  Yellowish discoloration of most distal palmar aspect of left index finger, with no nail involvement. Induration noted but finger pad is soft. Purulent discharge expressed. Exquisite tenderness to palpation.   Slight yellow discoloration of most distal palmar aspect of left fifth finger. Moderate tenderness to palpation.    Psychiatric: Affect normal.  Vitals reviewed.  Dg Finger Index Left  Result Date: 06/29/2016 CLINICAL DATA:  Skin infection for 1 month with pain EXAM: LEFT INDEX FINGER 2+V COMPARISON:  None. FINDINGS: Soft tissue defect involving the distal second digit with gas present. There is no fracture or subluxation. A tiny lucency is seen along the radial cortex of the distal second phalanx. IMPRESSION: 1. Soft tissue defect distal digit with gas present. 2. Tiny cortical defect of the distal second phalanx, a small focus of bony infection cannot be ruled out. Electronically Signed   By: Donavan Foil M.D.   On: 06/29/2016 18:42    Assessment and Plan :  This case was precepted with Dr. Linna Darner.  1. Skin infection Due to complexity of pt's case and concern for risk of osteomyelitis, have ordered STAT referral for hand surgery. Hand surgeon on call, Dr. Burney Gauze, contacted. Pt's case was presented to Dr. Burney Gauze and after reviewing pt's images, Dr. Burney Gauze states that pt can wait to be seen in their clinic on Tuesday,  07/03/16. Pt's nephrologist has instructed her that she cannot take clindamycin or bactrim at this point as both agents can be damaging to her kidneys. Pt has anaphylaxis to pencillins. I will extend doxycycline course by 5 days. Pt instructed that if symptoms worsen or she develops fever, chills, or other concerning symptoms while waiting to be seen by hand surgery to go to the ER immediately. Pt understands and agrees to treatment.  - DG Finger Index Left; Future - WOUND CULTURE - doxycycline (VIBRAMYCIN) 100 MG capsule; Take 1 capsule (100 mg total) by mouth 2 (two) times daily.  Dispense: 20 capsule; Refill: 0 - Ambulatory referral to Hand Surgery  Tenna Delaine PA-C  [facility] 06/29/2016 5:58 PM

## 2016-06-29 NOTE — Patient Instructions (Addendum)
  I have made an urgent referral to hand surgery. You should hear from them in a few days. I have given you an additional prescription for doxycycline as you are anaphylactically allergic to penicillin.   Return to clinic if symptoms worsen, do not improve, or as needed     IF you received an x-ray today, you will receive an invoice from [facility]. Please contact [facility] at [phone] with questions or concerns regarding your invoice.   IF you received labwork today, you will receive an invoice from [facility]. Please contact [facility] at [phone] with questions or concerns regarding your invoice.   Our billing staff will not be able to assist you with questions regarding bills from these companies.  You will be contacted with the lab results as soon as they are available. The fastest way to get your results is to activate your My Chart account. Instructions are located on the last page of this paperwork. If you have not heard from [place] regarding the results in 2 weeks, please contact this office.

## 2016-06-29 NOTE — Progress Notes (Signed)
Called to pharm to change rx to 5 day doxycicline dose bid per brittany

## 2016-06-30 ENCOUNTER — Encounter (HOSPITAL_COMMUNITY): Payer: Self-pay | Admitting: Emergency Medicine

## 2016-06-30 ENCOUNTER — Emergency Department (HOSPITAL_COMMUNITY)
Admission: EM | Admit: 2016-06-30 | Discharge: 2016-07-01 | Disposition: A | Discharge status: Home / Self Care | Payer: BLUE CROSS/BLUE SHIELD | Attending: Emergency Medicine | Admitting: Emergency Medicine

## 2016-06-30 ENCOUNTER — Telehealth: Payer: Self-pay | Admitting: Urgent Care

## 2016-06-30 DIAGNOSIS — Z87891 Personal history of nicotine dependence: Secondary | ICD-10-CM | POA: Insufficient documentation

## 2016-06-30 DIAGNOSIS — I10 Essential (primary) hypertension: Secondary | ICD-10-CM | POA: Diagnosis not present

## 2016-06-30 DIAGNOSIS — E119 Type 2 diabetes mellitus without complications: Secondary | ICD-10-CM | POA: Insufficient documentation

## 2016-06-30 DIAGNOSIS — E039 Hypothyroidism, unspecified: Secondary | ICD-10-CM | POA: Diagnosis not present

## 2016-06-30 DIAGNOSIS — M869 Osteomyelitis, unspecified: Secondary | ICD-10-CM | POA: Diagnosis not present

## 2016-06-30 DIAGNOSIS — L089 Local infection of the skin and subcutaneous tissue, unspecified: Secondary | ICD-10-CM

## 2016-06-30 DIAGNOSIS — M79642 Pain in left hand: Secondary | ICD-10-CM | POA: Diagnosis present

## 2016-06-30 NOTE — Telephone Encounter (Signed)
I discussed case with PA-Wiseman, Dr. Everlene Farrier. Dr. Burney Gauze had recommended to PA-Wiseman that patient be placed on trimethoprim-sulfamethoxazole until she could be seen for consult on 07/03/2016. We called patient to go over treatment plan and follow up. Due to her chronic medical conditions, chronic kidney disease, Cr >3, Dr. Everlene Farrier prefers patient not be placed on trimethoprim-sulfamethoxazole. I called patient and recommended she rtc for a recheck, lab work including POC cbc, esr, crp, bmet and possible emergent referral to ED for IV antibiotics. She is working today until [employer] and states that she would not be able to come. I did recommend that another option would be for her to go to the [facility] Emergency Dept where her rheumatologist works. An emergent consult may be more feasible there. Patient did not commit to either option. I did discuss possible outcomes with patient and hope that she can be seen either in an ED at [facility] or [facility]. I will forward this to Dr. Everlene Farrier and PA-Wiseman for their review.

## 2016-06-30 NOTE — ED Triage Notes (Signed)
Patient reports infection to left index finger nail x3 weeks. States she was seen at [facility] yesterday. Reports taking antibiotics as prescribed with no relief.

## 2016-07-01 MED ORDER — DOXYCYCLINE HYCLATE 100 MG PO TABS
100.0000 mg | ORAL_TABLET | Freq: Once | ORAL | Status: AC
  Administered 2016-07-01: 100 mg via ORAL
  Filled 2016-07-01: qty 1

## 2016-07-01 MED ORDER — DOXYCYCLINE HYCLATE 100 MG PO CAPS
100.0000 mg | ORAL_CAPSULE | Freq: Two times a day (BID) | ORAL | 0 refills | Status: DC
Start: 2016-07-01 — End: 2017-06-11

## 2016-07-01 MED ORDER — OXYCODONE HCL 5 MG PO CAPS: 5.0000 mg | ORAL_CAPSULE | ORAL | 0 refills | Status: DC | PRN

## 2016-07-01 MED ORDER — OXYCODONE HCL 5 MG PO TABS
5.0000 mg | ORAL_TABLET | Freq: Four times a day (QID) | ORAL | Status: DC | PRN
  Administered 2016-07-01: 5 mg via ORAL
  Filled 2016-07-01: qty 1

## 2016-07-01 NOTE — Discharge Instructions (Signed)
Please contact Dr. Bertis Ruddy office first thing Tuesday morning for your follow-up evaluation. Please return to the emergency room immediately if any new or worsening signs or symptoms present.

## 2016-07-01 NOTE — ED Provider Notes (Signed)
[place] DEPT Provider Note   CSN: [id number] Arrival date & time: 06/30/16  2010  By signing my name below, I, Norris Cross, attest that this documentation has been prepared under the direction and in the presence of [employer], PA-C. Electronically Signed: Norris Cross , ED Scribe. 07/01/16. 12:59 AM.    History   Chief Complaint Chief Complaint  Patient presents with  . Hand Pain    HPI Comments: Rebecca Mullins is a 40 y.o. female with h/o scleroderma who presents to the Emergency Department complaining of a moderate, gradually worsening area of pain and swelling to the L second digit onset 1 month ago. Pt states pain is worsened with palpation and direct pressure. On 05/30/2016 pt was seen for L index finger with similar presentation with infection to distal tip and purulent drainage. At that time she was placed on doxycycline. Per note, rheumatologist does not recommend I & D. Pt states she has had a total of 3 courses of doxycycline and her symptoms have persisted. Pt has an appointment scheduled in 2 days with hand surgery d/t concern for osteomyelitis. She denies fever, chills.    The history is provided by the patient. No language interpreter was used.    Past Medical History:  Diagnosis Date  . Anemia   . Bilateral cellulitis of lower leg   . Glomerulonephritis    Archie Endo 07/25/2015  . Hypertension    "just starting" (07/27/2015)  . Hypothyroidism   . Scleroderma ([place])    "fingers" (07/27/2015)  . Strep throat 1977; winter 2016  . Type II diabetes mellitus ([place]) dx'd 09/2014    Patient Active Problem List   Diagnosis Date Noted  . Low grade squamous intraepithelial lesion (LGSIL) on cervical Pap smear 11/29/2015  . Scleroderma ([place]) 10/11/2015  . ILD (interstitial lung disease) ([place]) 07/28/2015  . Proteinuria   . Nephrotic syndrome 07/22/2015  . Dyspnea and respiratory abnormality 06/29/2015  . 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 Obesity 06/29/2015  .  Venous insufficiency of both lower extremities   . Lymphedema   . Lipodermatosclerosis   . Edema 01/19/2013  . Cellulitis 01/19/2013    Past Surgical History:  Procedure Laterality Date  . RENAL BIOPSY Left 07/27/2015   US guided medical renal biopsy    OB History    Gravida Para Term Preterm AB Living   1             SAB TAB Ectopic Multiple Live Births                   Home Medications    Prior to Admission medications   Medication Sig Start Date End Date Taking? Authorizing Provider  furosemide (LASIX) 40 MG tablet Take 40 mg by mouth daily. 06/14/16 06/14/17 Yes Historical Provider, MD  levothyroxine (SYNTHROID, LEVOTHROID) 88 MCG tablet Take 88 mcg by mouth daily.   Yes Historical Provider, MD  medroxyPROGESTERone (DEPO-PROVERA) 150 MG/ML injection INJECT 1 ML (150 MG TOTAL) INTO THE MUSCLE EVERY 3 MONTHS 03/05/16  Yes Dorian Heckle English, PA  mycophenolate (CELLCEPT) 500 MG tablet Take by mouth 2 (two) times daily.   Yes Historical Provider, MD  omeprazole (PRILOSEC) 40 MG capsule Take 40 mg by mouth daily. 05/03/16  Yes Historical Provider, MD  predniSONE (DELTASONE) 10 MG tablet Take 10 mg by mouth daily. 06/14/16  Yes Historical Provider, MD  Prenat-FeFum-FePo-FA-Omega 3 (CONCEPT DHA) 53.5-38-1 MG CAPS Take 1 capsule by mouth every other day.    Yes Historical Provider, MD  ranitidine (ZANTAC) 150 MG capsule Take 150 mg by mouth 2 (two) times daily. 04/27/16 04/27/17 Yes Historical Provider, MD  doxycycline (VIBRAMYCIN) 100 MG capsule Take 1 capsule (100 mg total) by mouth 2 (two) times daily. 07/01/16   Okey Regal, PA-C  NIFEdipine (PROCARDIA-XL/ADALAT-CC/NIFEDICAL-XL) 30 MG 24 hr tablet Take 1 tablet (30 mg total) by mouth daily. Patient not taking: Reported on 06/29/2016 05/30/16   Melony Overly, MD  oxycodone (OXY-IR) 5 MG capsule Take 1 capsule (5 mg total) by mouth every 4 (four) hours as needed. 07/01/16   Okey Regal, PA-C    Family History Family History    Problem Relation Age of Onset  . Diabetes Mother   . Cancer Mother 47    breast  . Hyperlipidemia Father   . Diabetes Brother     Social History Social History  Substance Use Topics  . Smoking status: Former Smoker    Packs/day: 1.00    Years: 2.00    Types: Cigarettes    Start date: 02/28/1993    Quit date: 03/01/1995  . Smokeless tobacco: Never Used  . Alcohol use Yes     Comment: 07/27/2015 "might have a couple glasses of beer or wine q couple weeks"     Allergies   Penicillins and Nsaids   Review of Systems Review of Systems  A complete 10 system review of systems was obtained and all systems are negative except as noted in the HPI and PMH.    Physical Exam Updated Vital Signs BP 175/90 (BP Location: Left Arm)   Pulse 98   Temp 98.6 F (37 C) (Oral)   Resp 16   Ht 5\' 2"  (1.575 m)   Wt 89.1 kg   SpO2 97%   BMI 35.92 kg/m   Physical Exam  Constitutional: She appears well-developed and well-nourished. No distress.  HENT:  Head: Normocephalic and atraumatic.  Eyes: Conjunctivae are normal.  Neck: Neck supple.  Cardiovascular: Normal rate and regular rhythm.   No murmur heard. Pulmonary/Chest: Effort normal and breath sounds normal. No respiratory distress.  Abdominal: Soft. There is no tenderness.  Musculoskeletal: She exhibits no edema.  L index finger tip with infection and necrosis   Neurological: She is alert.  Skin: Skin is warm and dry.  Psychiatric: She has a normal mood and affect.  Nursing note and vitals reviewed.    ED Treatments / Results   DIAGNOSTIC STUDIES: Oxygen Saturation is 99% on RA, normal by my interpretation.   COORDINATION OF CARE: 1:00 AM-Discussed next steps with pt. Pt verbalized understanding and is agreeable with the plan.    Labs (all labs ordered are listed, but only abnormal results are displayed) Labs Reviewed - No data to display  EKG  EKG Interpretation None       Radiology Dg Finger Index  Left  Result Date: 06/29/2016 CLINICAL DATA:  Skin infection for 1 month with pain EXAM: LEFT INDEX FINGER 2+V COMPARISON:  None. FINDINGS: Soft tissue defect involving the distal second digit with gas present. There is no fracture or subluxation. A tiny lucency is seen along the radial cortex of the distal second phalanx. IMPRESSION: 1. Soft tissue defect distal digit with gas present. 2. Tiny cortical defect of the distal second phalanx, a small focus of bony infection cannot be ruled out. Electronically Signed   By: Donavan Foil M.D.   On: 06/29/2016 18:42    Procedures Procedures (including critical care time)  Medications Ordered in ED Medications  oxyCODONE (  Oxy IR/ROXICODONE) immediate release tablet 5 mg (5 mg Oral Given 07/01/16 0121)  doxycycline (VIBRA-TABS) tablet 100 mg (100 mg Oral Given 07/01/16 0225)     Initial Impression / Assessment and Plan / ED Course  I have reviewed the triage vital signs and the nursing notes.  Pertinent labs & imaging results that were available during my care of the patient were reviewed by me and considered in my medical decision making (see chart for details).  Clinical Course     Labs:  Imaging:  Consults:  Therapeutics:  Discharge Meds: Oxycodone  Assessment/Plan:  40 year old female with a chronic infection to the distal aspect of her left index finger. She was seen by urgent care yesterday who personally spoke with on-call hand surgeon. He recommended patient follow-up in the clinic on Tuesday. This has been going on for approximately one month, patient does likely have osteomyelitis based on x-ray in chronicity. Due to no rapid changes in baseline infection, And to schedule appropriate follow-up evaluation, patient will be given pain medication and discharged with close follow-up with hand surgery. She is given return cautioned, she verbalized understanding and agreement to today's plan had no further questions or concerns at time  of discharge    Final Clinical Impressions(s) / ED Diagnoses   Final diagnoses:  Osteomyelitis of left hand, unspecified type [facility])    New Prescriptions Discharge Medication List as of 07/01/2016  1:44 AM    START taking these medications   Details  oxycodone (OXY-IR) 5 MG capsule Take 1 capsule (5 mg total) by mouth every 4 (four) hours as needed., Starting Sun 07/01/2016, Print       I personally performed the services described in this documentation, which was scribed in my presence. The recorded information has been reviewed and is accurate.     Okey Regal, PA-C 07/01/16 0250    Quintella Reichert, MD 07/04/16 [phone]

## 2016-07-04 LAB — WOUND CULTURE: ORGANISM ID, BACTERIA: NONE SEEN

## 2016-07-10 ENCOUNTER — Telehealth: Payer: Self-pay | Admitting: Rheumatology

## 2016-07-10 NOTE — Telephone Encounter (Signed)
Patient is requesting a call back about the pain and swelling in her hands.

## 2016-07-10 NOTE — Telephone Encounter (Signed)
Patient returned a call. Please call her back.

## 2016-07-10 NOTE — Telephone Encounter (Signed)
She has been advised to follow up at [facility] regarding her hands. I have called her again. No answer, she has very complicated situation systemic sclerosis.  Patient has declined treatment in the past, she is also complicated by her kidney disease.

## 2016-07-11 ENCOUNTER — Telehealth: Payer: Self-pay | Admitting: Emergency Medicine

## 2016-07-11 NOTE — Telephone Encounter (Signed)
-----   Message from Leonie Douglas, PA-C sent at 07/10/2016  9:34 PM EST ----- Please tell pt she needs to follow up with her rheum and PCP in [place] for this particular issue since the hand surgeon we referred her to believes it to be related to her Raynauds. Thanks !

## 2016-07-12 NOTE — Telephone Encounter (Signed)
Patient advised to follow up at [facility] regarding her hands. Patient verbalized understanding.

## 2017-02-20 ENCOUNTER — Encounter: Payer: Self-pay | Admitting: Cardiology

## 2017-03-14 ENCOUNTER — Ambulatory Visit: Payer: BLUE CROSS/BLUE SHIELD | Admitting: Cardiology

## 2017-04-22 ENCOUNTER — Other Ambulatory Visit: Payer: Self-pay

## 2017-04-22 DIAGNOSIS — N185 Chronic kidney disease, stage 5: Secondary | ICD-10-CM

## 2017-05-02 NOTE — Discharge Instructions (Signed)

## 2017-05-03 ENCOUNTER — Ambulatory Visit (HOSPITAL_COMMUNITY)
Admission: RE | Admit: 2017-05-03 | Discharge: 2017-05-03 | Disposition: A | Discharge status: Home / Self Care | Payer: Medicaid Other | Source: Ambulatory Visit | Attending: Nephrology | Admitting: Nephrology

## 2017-05-03 DIAGNOSIS — D631 Anemia in chronic kidney disease: Secondary | ICD-10-CM | POA: Diagnosis present

## 2017-05-03 DIAGNOSIS — N049 Nephrotic syndrome with unspecified morphologic changes: Secondary | ICD-10-CM

## 2017-05-03 DIAGNOSIS — N185 Chronic kidney disease, stage 5: Secondary | ICD-10-CM | POA: Diagnosis present

## 2017-05-03 LAB — CBC
HCT: 30.3 % — ABNORMAL LOW (ref 36.0–46.0)
Hemoglobin: 9.6 g/dL — ABNORMAL LOW (ref 12.0–15.0)
MCH: 26.8 pg (ref 26.0–34.0)
MCHC: 31.7 g/dL (ref 30.0–36.0)
MCV: 84.6 fL (ref 78.0–100.0)
PLATELETS: 394 10*3/uL (ref 150–400)
RBC: 3.58 MIL/uL — ABNORMAL LOW (ref 3.87–5.11)
RDW: 14.3 % (ref 11.5–15.5)
WBC: 10.3 10*3/uL (ref 4.0–10.5)

## 2017-05-03 MED ORDER — DARBEPOETIN ALFA 60 MCG/0.3ML IJ SOSY
PREFILLED_SYRINGE | INTRAMUSCULAR | Status: AC
  Filled 2017-05-03: qty 0.3

## 2017-05-03 MED ORDER — DARBEPOETIN ALFA 60 MCG/0.3ML IJ SOSY
60.0000 ug | PREFILLED_SYRINGE | INTRAMUSCULAR | Status: DC
  Administered 2017-05-03: 60 ug via SUBCUTANEOUS

## 2017-05-20 ENCOUNTER — Other Ambulatory Visit (HOSPITAL_COMMUNITY): Payer: Medicaid Other

## 2017-05-20 ENCOUNTER — Encounter (HOSPITAL_COMMUNITY): Payer: Medicaid Other

## 2017-05-20 ENCOUNTER — Encounter: Payer: Medicaid Other | Admitting: Vascular Surgery

## 2017-05-31 ENCOUNTER — Encounter (HOSPITAL_COMMUNITY): Payer: Medicaid Other

## 2017-06-06 ENCOUNTER — Other Ambulatory Visit (HOSPITAL_COMMUNITY): Payer: Self-pay

## 2017-06-07 ENCOUNTER — Ambulatory Visit (HOSPITAL_COMMUNITY)
Admission: RE | Admit: 2017-06-07 | Discharge: 2017-06-07 | Disposition: A | Discharge status: Home / Self Care | Payer: Medicaid Other | Source: Ambulatory Visit | Attending: Nephrology | Admitting: Nephrology

## 2017-06-07 VITALS — BP 93/72 | HR 104 | Temp 98.0°F | Resp 18

## 2017-06-07 DIAGNOSIS — N049 Nephrotic syndrome with unspecified morphologic changes: Secondary | ICD-10-CM

## 2017-06-07 DIAGNOSIS — D631 Anemia in chronic kidney disease: Secondary | ICD-10-CM | POA: Insufficient documentation

## 2017-06-07 DIAGNOSIS — N185 Chronic kidney disease, stage 5: Secondary | ICD-10-CM | POA: Diagnosis present

## 2017-06-07 LAB — COMPREHENSIVE METABOLIC PANEL
ALT: 18 U/L (ref 14–54)
AST: 25 U/L (ref 15–41)
Albumin: 2.9 g/dL — ABNORMAL LOW (ref 3.5–5.0)
Alkaline Phosphatase: 143 U/L — ABNORMAL HIGH (ref 38–126)
Anion gap: 11 (ref 5–15)
BILIRUBIN TOTAL: 0.4 mg/dL (ref 0.3–1.2)
BUN: 65 mg/dL — AB (ref 6–20)
CO2: 20 mmol/L — ABNORMAL LOW (ref 22–32)
CREATININE: 7.05 mg/dL — AB (ref 0.44–1.00)
Calcium: 8.1 mg/dL — ABNORMAL LOW (ref 8.9–10.3)
Chloride: 105 mmol/L (ref 101–111)
GFR calc Af Amer: 8 mL/min — ABNORMAL LOW (ref >60)
GFR, EST NON AFRICAN AMERICAN: 7 mL/min — AB (ref >60)
Glucose, Bld: 155 mg/dL — ABNORMAL HIGH (ref 65–99)
Potassium: 3 mmol/L — ABNORMAL LOW (ref 3.5–5.1)
Sodium: 136 mmol/L (ref 135–145)
TOTAL PROTEIN: 7.4 g/dL (ref 6.5–8.1)

## 2017-06-07 LAB — CBC
HCT: 30.3 % — ABNORMAL LOW (ref 36.0–46.0)
HEMOGLOBIN: 9.5 g/dL — AB (ref 12.0–15.0)
MCH: 27 pg (ref 26.0–34.0)
MCHC: 31.4 g/dL (ref 30.0–36.0)
MCV: 86.1 fL (ref 78.0–100.0)
PLATELETS: 376 10*3/uL (ref 150–400)
RBC: 3.52 MIL/uL — ABNORMAL LOW (ref 3.87–5.11)
RDW: 14.5 % (ref 11.5–15.5)
WBC: 10.5 10*3/uL (ref 4.0–10.5)

## 2017-06-07 LAB — PHOSPHORUS: PHOSPHORUS: 5.6 mg/dL — AB (ref 2.5–4.6)

## 2017-06-07 MED ORDER — DARBEPOETIN ALFA 60 MCG/0.3ML IJ SOSY
PREFILLED_SYRINGE | INTRAMUSCULAR | Status: AC
  Administered 2017-06-07: 14:00:00 60 ug via SUBCUTANEOUS
  Filled 2017-06-07: qty 0.3

## 2017-06-07 MED ORDER — DARBEPOETIN ALFA 60 MCG/0.3ML IJ SOSY
60.0000 ug | PREFILLED_SYRINGE | INTRAMUSCULAR | Status: DC
  Administered 2017-06-07: 60 ug via SUBCUTANEOUS

## 2017-06-11 ENCOUNTER — Emergency Department (HOSPITAL_COMMUNITY): Payer: Medicaid Other

## 2017-06-11 ENCOUNTER — Encounter (HOSPITAL_COMMUNITY): Payer: Self-pay

## 2017-06-11 ENCOUNTER — Emergency Department (HOSPITAL_COMMUNITY)
Admission: EM | Admit: 2017-06-11 | Discharge: 2017-06-11 | Disposition: A | Discharge status: Home / Self Care | Payer: Medicaid Other | Attending: Emergency Medicine | Admitting: Emergency Medicine

## 2017-06-11 ENCOUNTER — Other Ambulatory Visit: Payer: Self-pay

## 2017-06-11 DIAGNOSIS — I10 Essential (primary) hypertension: Secondary | ICD-10-CM | POA: Insufficient documentation

## 2017-06-11 DIAGNOSIS — L089 Local infection of the skin and subcutaneous tissue, unspecified: Secondary | ICD-10-CM

## 2017-06-11 DIAGNOSIS — Z79899 Other long term (current) drug therapy: Secondary | ICD-10-CM | POA: Diagnosis not present

## 2017-06-11 DIAGNOSIS — Z87891 Personal history of nicotine dependence: Secondary | ICD-10-CM | POA: Diagnosis not present

## 2017-06-11 DIAGNOSIS — M79671 Pain in right foot: Secondary | ICD-10-CM

## 2017-06-11 DIAGNOSIS — E119 Type 2 diabetes mellitus without complications: Secondary | ICD-10-CM | POA: Diagnosis not present

## 2017-06-11 LAB — BASIC METABOLIC PANEL
ANION GAP: 11 (ref 5–15)
BUN: 75 mg/dL — ABNORMAL HIGH (ref 6–20)
CHLORIDE: 105 mmol/L (ref 101–111)
CO2: 20 mmol/L — ABNORMAL LOW (ref 22–32)
Calcium: 8.4 mg/dL — ABNORMAL LOW (ref 8.9–10.3)
Creatinine, Ser: 7.43 mg/dL — ABNORMAL HIGH (ref 0.44–1.00)
GFR, EST AFRICAN AMERICAN: 7 mL/min — AB (ref >60)
GFR, EST NON AFRICAN AMERICAN: 6 mL/min — AB (ref >60)
Glucose, Bld: 105 mg/dL — ABNORMAL HIGH (ref 65–99)
POTASSIUM: 3.4 mmol/L — AB (ref 3.5–5.1)
SODIUM: 136 mmol/L (ref 135–145)

## 2017-06-11 LAB — CBC WITH DIFFERENTIAL/PLATELET
BASOS ABS: 0.1 10*3/uL (ref 0.0–0.1)
BASOS PCT: 1 %
EOS ABS: 0.6 10*3/uL (ref 0.0–0.7)
Eosinophils Relative: 5 %
HCT: 32.1 % — ABNORMAL LOW (ref 36.0–46.0)
Hemoglobin: 10.2 g/dL — ABNORMAL LOW (ref 12.0–15.0)
LYMPHS ABS: 4.1 10*3/uL — AB (ref 0.7–4.0)
Lymphocytes Relative: 30 %
MCH: 27.3 pg (ref 26.0–34.0)
MCHC: 31.8 g/dL (ref 30.0–36.0)
MCV: 86.1 fL (ref 78.0–100.0)
Monocytes Absolute: 1 10*3/uL (ref 0.1–1.0)
Monocytes Relative: 7 %
NEUTROS PCT: 57 %
Neutro Abs: 7.7 10*3/uL (ref 1.7–7.7)
Platelets: 451 10*3/uL — ABNORMAL HIGH (ref 150–400)
RBC: 3.73 MIL/uL — AB (ref 3.87–5.11)
RDW: 14.8 % (ref 11.5–15.5)
WBC: 13.4 10*3/uL — AB (ref 4.0–10.5)

## 2017-06-11 MED ORDER — DOXYCYCLINE HYCLATE 100 MG PO CAPS: 100.0000 mg | ORAL_CAPSULE | Freq: Two times a day (BID) | ORAL | 0 refills | Status: DC

## 2017-06-11 NOTE — ED Triage Notes (Signed)
Pt reports that she has left heel pain x 3 weeks in which she noticed after getting a pedicure. Pt reports that area is tender and painful when touched 10/10. Pt reports that she had some drainage. Pt reports that she is suppose to be seen by wound care center on Dec. 14, however pt states that has been to painful and she cannot wait. Pt states that she saw the Rheumatologist  1 week ago, and was instructed to come to the ED if symptoms worsen.   Pt reports that she has Scleroderma, and poor kidney fxn .

## 2017-06-11 NOTE — Discharge Instructions (Signed)
Schedule to see Dr. Amalia Hailey for evaluation

## 2017-06-13 NOTE — ED Provider Notes (Signed)
[facility] DEPT Provider Note   CSN: [id number] Arrival date & time: 06/11/17  1621     History   Chief Complaint Chief Complaint  Patient presents with  . Foot Pain    HPI Rebecca Mullins is a 41 y.o. female.  Pt has had pain in her heel and foot after having a pedicure.   Pt reports bottom her foot looks dark.   The history is provided by the patient. No language interpreter was used.  Foot Pain  This is a new problem. Episode onset: 3 weeks. The problem occurs constantly. The problem has been gradually worsening. Pertinent negatives include no abdominal pain. Nothing aggravates the symptoms. Nothing relieves the symptoms. She has tried nothing for the symptoms. The treatment provided no relief.  Pt reports heel P  Past Medical History:  Diagnosis Date  . Anemia   . Bilateral cellulitis of lower leg   . Glomerulonephritis    Archie Endo 07/25/2015  . Hypertension    "just starting" (07/27/2015)  . Hypothyroidism   . Scleroderma ([place])    "fingers" (07/27/2015)  . Strep throat 1977; winter 2016  . Type II diabetes mellitus ([place]) dx'd 09/2014    Patient Active Problem List   Diagnosis Date Noted  . Low grade squamous intraepithelial lesion (LGSIL) on cervical Pap smear 11/29/2015  . Scleroderma ([place]) 10/11/2015  . ILD (interstitial lung disease) ([place]) 07/28/2015  . Proteinuria   . Nephrotic syndrome 07/22/2015  . Dyspnea and respiratory abnormality 06/29/2015  . 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 Obesity 06/29/2015  . Venous insufficiency of both lower extremities   . Lymphedema   . Lipodermatosclerosis   . Edema 01/19/2013  . Cellulitis 01/19/2013    Past Surgical History:  Procedure Laterality Date  . RENAL BIOPSY Left 07/27/2015   US guided medical renal biopsy    OB History    Gravida Para Term Preterm AB Living   1             SAB TAB Ectopic Multiple Live Births                   Home Medications    Prior to Admission  medications   Medication Sig Start Date End Date Taking? Authorizing Provider  doxycycline (VIBRAMYCIN) 100 MG capsule Take 1 capsule (100 mg total) by mouth 2 (two) times daily. 06/11/17   Fransico Meadow, PA-C  furosemide (LASIX) 40 MG tablet Take 40 mg by mouth daily. 06/14/16 06/14/17  [provider]  levothyroxine (SYNTHROID, LEVOTHROID) 88 MCG tablet Take 88 mcg by mouth daily.    [provider]  medroxyPROGESTERone (DEPO-PROVERA) 150 MG/ML injection INJECT 1 ML (150 MG TOTAL) INTO THE MUSCLE EVERY 3 MONTHS 03/05/16   Ivar Drape D, PA  mycophenolate (CELLCEPT) 500 MG tablet Take by mouth 2 (two) times daily.    [provider]  NIFEdipine (PROCARDIA-XL/ADALAT-CC/NIFEDICAL-XL) 30 MG 24 hr tablet Take 1 tablet (30 mg total) by mouth daily. Patient not taking: Reported on 06/29/2016 05/30/16   Melony Overly, MD  omeprazole (PRILOSEC) 40 MG capsule Take 40 mg by mouth daily. 05/03/16   [provider]  oxycodone (OXY-IR) 5 MG capsule Take 1 capsule (5 mg total) by mouth every 4 (four) hours as needed. 07/01/16   Hedges, Dellis Filbert, PA-C  predniSONE (DELTASONE) 10 MG tablet Take 10 mg by mouth daily. 06/14/16   [provider]  Prenat-FeFum-FePo-FA-Omega 3 (CONCEPT DHA) 53.5-38-1 MG CAPS Take 1 capsule by mouth  every other day.     [provider]  ranitidine (ZANTAC) 150 MG capsule Take 150 mg by mouth 2 (two) times daily. 04/27/16 04/27/17  [provider]    Family History Family History  Problem Relation Age of Onset  . Diabetes Mother   . Cancer Mother 69       breast  . Hyperlipidemia Father   . Diabetes Brother     Social History Social History   Tobacco Use  . Smoking status: Former Smoker    Packs/day: 1.00    Years: 2.00    Pack years: 2.00    Types: Cigarettes    Start date: 02/28/1993    Last attempt to quit: 03/01/1995    Years since quitting: 22.3  . Smokeless tobacco: Never Used  Substance Use Topics   . Alcohol use: Yes    Comment: 07/27/2015 "might have a couple glasses of beer or wine q couple weeks"  . Drug use: No     Allergies   Penicillins and Nsaids   Review of Systems Review of Systems  Gastrointestinal: Negative for abdominal pain.  All other systems reviewed and are negative.    Physical Exam Updated Vital Signs BP (!) 132/96 (BP Location: Right Arm)   Pulse 99   Temp 98.2 F (36.8 C) (Oral)   Resp 20   Ht 5\' 2"  (1.575 m)   Wt 90.3 kg (199 lb)   LMP  (LMP Unknown)   SpO2 100%   BMI 36.40 kg/m   Physical Exam  Constitutional: She is oriented to person, place, and time. She appears well-developed and well-nourished.  HENT:  Head: Normocephalic.  Eyes: EOM are normal.  Neck: Normal range of motion.  Pulmonary/Chest: Effort normal.  Abdominal: She exhibits no distension.  Musculoskeletal: Normal range of motion.  Tender right heel,  Dark areas,  Pulse present    Neurological: She is alert and oriented to person, place, and time.  Psychiatric: She has a normal mood and affect.  Nursing note and vitals reviewed.    ED Treatments / Results  Labs (all labs ordered are listed, but only abnormal results are displayed) Labs Reviewed  CBC WITH DIFFERENTIAL/PLATELET - Abnormal; Notable for the following components:      Result Value   WBC 13.4 (*)    RBC 3.73 (*)    Hemoglobin 10.2 (*)    HCT 32.1 (*)    Platelets 451 (*)    Lymphs Abs 4.1 (*)    All other components within normal limits  BASIC METABOLIC PANEL - Abnormal; Notable for the following components:   Potassium 3.4 (*)    CO2 20 (*)    Glucose, Bld 105 (*)    BUN 75 (*)    Creatinine, Ser 7.43 (*)    Calcium 8.4 (*)    GFR calc non Af Amer 6 (*)    GFR calc Af Amer 7 (*)    All other components within normal limits    EKG  EKG Interpretation None       Radiology Dg Foot Complete Right  Result Date: 06/11/2017 CLINICAL DATA:  Pain, swelling, and skin breakdown along the  lateral aspect of the right foot for the past 3 weeks. EXAM: RIGHT FOOT COMPLETE - 3+ VIEW COMPARISON:  None. FINDINGS: There is no evidence of fracture or dislocation. There is no evidence of arthropathy or other focal bone abnormality. Plantar enthesopathy. Soft tissues are unremarkable. IMPRESSION: Negative. Electronically Signed   By: Gwyndolyn Saxon  Marzella Schlein M.D.   On: 06/11/2017 18:45    Procedures Procedures (including critical care time)  Medications Ordered in ED Medications - No data to display   Initial Impression / Assessment and Plan / ED Course  I have reviewed the triage vital signs and the nursing notes.  Pertinent labs & imaging results that were available during my care of the patient were reviewed by me and considered in my medical decision making (see chart for details).       Final Clinical Impressions(s) / ED Diagnoses   Final diagnoses:  Foot pain, right    ED Discharge Orders        Ordered    doxycycline (VIBRAMYCIN) 100 MG capsule  2 times daily     06/11/17 2010    Pt advised to follow up with a Podiatrist,  Pt advised to call for appointment   Fransico Meadow, PA-C 06/13/17 1739    Drenda Freeze, MD 06/14/17 [phone]

## 2017-07-05 ENCOUNTER — Encounter (HOSPITAL_COMMUNITY)
Admission: RE | Admit: 2017-07-05 | Discharge: 2017-07-05 | Disposition: A | Discharge status: Home / Self Care | Payer: Medicaid Other | Source: Ambulatory Visit | Attending: Nephrology | Admitting: Nephrology

## 2017-07-05 VITALS — BP 97/71 | HR 94 | Temp 98.0°F | Resp 18

## 2017-07-05 DIAGNOSIS — N049 Nephrotic syndrome with unspecified morphologic changes: Secondary | ICD-10-CM | POA: Insufficient documentation

## 2017-07-05 LAB — CBC
HCT: 30.1 % — ABNORMAL LOW (ref 36.0–46.0)
Hemoglobin: 9.6 g/dL — ABNORMAL LOW (ref 12.0–15.0)
MCH: 26.7 pg (ref 26.0–34.0)
MCHC: 31.9 g/dL (ref 30.0–36.0)
MCV: 83.8 fL (ref 78.0–100.0)
PLATELETS: 392 10*3/uL (ref 150–400)
RBC: 3.59 MIL/uL — AB (ref 3.87–5.11)
RDW: 14.7 % (ref 11.5–15.5)
WBC: 10.1 10*3/uL (ref 4.0–10.5)

## 2017-07-05 LAB — COMPREHENSIVE METABOLIC PANEL
ALK PHOS: 110 U/L (ref 38–126)
ALT: 16 U/L (ref 14–54)
ANION GAP: 12 (ref 5–15)
AST: 19 U/L (ref 15–41)
Albumin: 3 g/dL — ABNORMAL LOW (ref 3.5–5.0)
BUN: 98 mg/dL — ABNORMAL HIGH (ref 6–20)
CALCIUM: 8 mg/dL — AB (ref 8.9–10.3)
CO2: 20 mmol/L — AB (ref 22–32)
Chloride: 106 mmol/L (ref 101–111)
Creatinine, Ser: 9.63 mg/dL — ABNORMAL HIGH (ref 0.44–1.00)
GFR calc non Af Amer: 4 mL/min — ABNORMAL LOW (ref >60)
GFR, EST AFRICAN AMERICAN: 5 mL/min — AB (ref >60)
Glucose, Bld: 123 mg/dL — ABNORMAL HIGH (ref 65–99)
Potassium: 3.2 mmol/L — ABNORMAL LOW (ref 3.5–5.1)
SODIUM: 138 mmol/L (ref 135–145)
Total Bilirubin: 0.8 mg/dL (ref 0.3–1.2)
Total Protein: 7.1 g/dL (ref 6.5–8.1)

## 2017-07-05 LAB — FERRITIN: Ferritin: 189 ng/mL (ref 11–307)

## 2017-07-05 LAB — PHOSPHORUS: Phosphorus: 6.9 mg/dL — ABNORMAL HIGH (ref 2.5–4.6)

## 2017-07-05 LAB — IRON AND TIBC
IRON: 35 ug/dL (ref 28–170)
SATURATION RATIOS: 13 % (ref 10.4–31.8)
TIBC: 267 ug/dL (ref 250–450)
UIBC: 232 ug/dL

## 2017-07-05 MED ORDER — DARBEPOETIN ALFA 60 MCG/0.3ML IJ SOSY: 60.0000 ug | PREFILLED_SYRINGE | INTRAMUSCULAR | Status: DC

## 2017-07-05 MED ORDER — DARBEPOETIN ALFA 60 MCG/0.3ML IJ SOSY
PREFILLED_SYRINGE | INTRAMUSCULAR | Status: AC
  Administered 2017-07-05: 14:00:00 60 ug
  Filled 2017-07-05: qty 0.3

## 2017-07-08 ENCOUNTER — Ambulatory Visit (HOSPITAL_COMMUNITY)
Admission: RE | Admit: 2017-07-08 | Discharge: 2017-07-08 | Disposition: A | Discharge status: Home / Self Care | Payer: Medicaid Other | Source: Ambulatory Visit | Attending: Vascular Surgery | Admitting: Vascular Surgery

## 2017-07-08 ENCOUNTER — Ambulatory Visit (INDEPENDENT_AMBULATORY_CARE_PROVIDER_SITE_OTHER)
Admission: RE | Admit: 2017-07-08 | Discharge: 2017-07-08 | Disposition: A | Discharge status: Home / Self Care | Payer: Medicaid Other | Source: Ambulatory Visit | Attending: Vascular Surgery | Admitting: Vascular Surgery

## 2017-07-08 DIAGNOSIS — N185 Chronic kidney disease, stage 5: Secondary | ICD-10-CM | POA: Insufficient documentation

## 2017-07-08 DIAGNOSIS — Z01818 Encounter for other preprocedural examination: Secondary | ICD-10-CM | POA: Insufficient documentation

## 2017-07-15 ENCOUNTER — Encounter: Payer: Self-pay | Admitting: *Deleted

## 2017-07-15 ENCOUNTER — Encounter: Payer: Self-pay | Admitting: Surgery

## 2017-07-15 ENCOUNTER — Encounter (HOSPITAL_COMMUNITY): Payer: Medicaid Other

## 2017-07-15 ENCOUNTER — Other Ambulatory Visit: Payer: Self-pay | Admitting: *Deleted

## 2017-07-15 ENCOUNTER — Ambulatory Visit (INDEPENDENT_AMBULATORY_CARE_PROVIDER_SITE_OTHER): Payer: Medicaid Other | Admitting: Surgery

## 2017-07-15 ENCOUNTER — Other Ambulatory Visit (HOSPITAL_COMMUNITY): Payer: Medicaid Other

## 2017-07-15 VITALS — BP 100/70 | HR 100 | Temp 98.6°F | Resp 16 | Ht 62.0 in | Wt 212.0 lb

## 2017-07-15 DIAGNOSIS — N185 Chronic kidney disease, stage 5: Secondary | ICD-10-CM | POA: Diagnosis not present

## 2017-07-15 NOTE — H&P (View-Only) (Signed)
Vascular and Vein Specialist of [facility]  Patient name: Rebecca Mullins MRN: [id number] DOB: 01/02/1976 Sex: female   REQUESTING PROVIDER:    Dr. Marval Regal   REASON FOR CONSULT:    ESRD  HISTORY OF PRESENT ILLNESS:   Rebecca Mullins is a 42 y.o. female, who is referred today for permanent dialysis access.  She is right-handed.  She has scleroderma as well as Raynaud's phenomenon.  She also suffers from polycystic ovarian syndrome and morbid obesity.  She does have issues with shortness of breath with exertion as well as lower extremity edema and heaviness in her leg as well as a pressure sore on her right foot.  PAST MEDICAL HISTORY    Past Medical History:  Diagnosis Date  . Anemia   . Bilateral cellulitis of lower leg   . Glomerulonephritis    Archie Endo 07/25/2015  . Hypertension    "just starting" (07/27/2015)  . Hypothyroidism   . Scleroderma ([place])    "fingers" (07/27/2015)  . Strep throat 1977; winter 2016  . Type II diabetes mellitus ([place]) dx'd 09/2014     FAMILY HISTORY   Family History  Problem Relation Age of Onset  . Diabetes Mother   . Cancer Mother 51       breast  . Hyperlipidemia Father   . Diabetes Brother     SOCIAL HISTORY:   Social History   Socioeconomic History  . Marital status: Single    Spouse name: Not on file  . Number of children: Not on file  . Years of education: Not on file  . Highest education level: Not on file  Social Needs  . Financial resource strain: Not on file  . Food insecurity - worry: Not on file  . Food insecurity - inability: Not on file  . Transportation needs - medical: Not on file  . Transportation needs - non-medical: Not on file  Occupational History  . Not on file  Tobacco Use  . Smoking status: Former Smoker    Packs/day: 1.00    Years: 2.00    Pack years: 2.00    Types: Cigarettes    Start date: 02/28/1993    Last attempt to quit: 03/01/1995    Years since  quitting: 22.3  . Smokeless tobacco: Never Used  Substance and Sexual Activity  . Alcohol use: Yes    Comment: 07/27/2015 "might have a couple glasses of beer or wine q couple weeks"  . Drug use: No  . Sexual activity: Yes    Birth control/protection: Injection  Other Topics Concern  . Not on file  Social History Narrative  . Not on file    ALLERGIES:    Allergies  Allergen Reactions  . Penicillins Anaphylaxis    Has patient had a PCN reaction causing immediate rash, facial/tongue/throat swelling, SOB or lightheadedness with hypotension: Yes Has patient had a PCN reaction causing severe rash involving mucus membranes or skin necrosis: No Has patient had a PCN reaction that required hospitalization No Has patient had a PCN reaction occurring within the last 10 years: No If all of the above answers are "NO", then may proceed with Cephalosporin use.   . Nsaids     Kidney issues  . Oxycodone     Patient says unable to take this medicine.    CURRENT MEDICATIONS:    Current Outpatient Medications  Medication Sig Dispense Refill  . levothyroxine (SYNTHROID, LEVOTHROID) 88 MCG tablet Take 88 mcg by mouth daily.    Marland Kitchen  medroxyPROGESTERone (DEPO-PROVERA) 150 MG/ML injection INJECT 1 ML (150 MG TOTAL) INTO THE MUSCLE EVERY 3 MONTHS 1 mL 1  . mycophenolate (CELLCEPT) 500 MG tablet Take by mouth 2 (two) times daily.    Marland Kitchen NIFEdipine (PROCARDIA-XL/ADALAT-CC/NIFEDICAL-XL) 30 MG 24 hr tablet Take 1 tablet (30 mg total) by mouth daily. 30 tablet 0  . omeprazole (PRILOSEC) 40 MG capsule Take 40 mg by mouth daily.  0  . predniSONE (DELTASONE) 10 MG tablet Take 20 mg by mouth daily.     . Prenat-FeFum-FePo-FA-Omega 3 (CONCEPT DHA) 53.5-38-1 MG CAPS Take 1 capsule by mouth every other day.     . ranitidine (ZANTAC) 150 MG capsule Take 150 mg by mouth 2 (two) times daily.     No current facility-administered medications for this visit.     REVIEW OF SYSTEMS:   [X]  denotes positive finding, [  ] denotes negative finding Cardiac  Comments:  Chest pain or chest pressure:    Shortness of breath upon exertion: xx   Short of breath when lying flat:    Irregular heart rhythm:        Vascular    Pain in calf, thigh, or hip brought on by ambulation:    Pain in feet at night that wakes you up from your sleep:     Blood clot in your veins:    Leg swelling:  xx       Pulmonary    Oxygen at home:    Productive cough:     Wheezing:         Neurologic    Sudden weakness in arms or legs:     Sudden numbness in arms or legs:     Sudden onset of difficulty speaking or slurred speech:    Temporary loss of vision in one eye:     Problems with dizziness:         Gastrointestinal    Blood in stool:      Vomited blood:         Genitourinary    Burning when urinating:     Blood in urine:        Psychiatric    Major depression:         Hematologic    Bleeding problems:    Problems with blood clotting too easily:        Skin    Rashes or ulcers: xx       Constitutional    Fever or chills:     PHYSICAL EXAM:   Vitals:   07/15/17 1438  BP: 100/70  Pulse: 100  Resp: 16  Temp: 98.6 F (37 C)  TempSrc: Oral  SpO2: 98%  Weight: 212 lb (96.2 kg)  Height: 5\' 2"  (1.575 m)    GENERAL: The patient is a well-nourished female, in no acute distress. The vital signs are documented above. CARDIAC: There is a regular rate and rhythm.  VASCULAR: Upper extremity pulses are not palpable.  Bilateral lower extremity edema PULMONARY: Nonlabored respirations ABDOMEN: Soft and non-tender with normal pitched bowel sounds.  MUSCULOSKELETAL: There are no major deformities or cyanosis. NEUROLOGIC: No focal weakness or paresthesias are detected. SKIN: Right leg pressure sore. PSYCHIATRIC: The patient has a normal affect.  STUDIES:   I have evaluated her vein mapping.  She does not have an adequate cephalic or basilic vein in bilateral upper extremities. Arterial duplex shows monophasic  waveforms throughout both upper extremities  ASSESSMENT and PLAN   I spoke with Dr. Marval Regal  regarding the patient's ultrasound findings today.  The patient is in urgent need of starting dialysis, and therefore she will be scheduled for a tunneled dialysis catheter.  Currently, I do not recommend placing an upper arm fistula or graft as she does not have palpable pulses.  Once she has started dialysis, I will consider getting a CT angiogram to see if she has adequate inflow for either a thigh graft or upper extremity graft.  She is also considering peritoneal dialysis.   Annamarie Major, MD [facility] and Vein Specialists of [facility] [phone] Pager [phone]

## 2017-07-15 NOTE — Progress Notes (Signed)
Vascular and Vein Specialist of [facility]  Patient name: Rebecca Mullins MRN: [id number] DOB: 1976-05-07 Sex: female   REQUESTING PROVIDER:    Dr. Marval Regal   REASON FOR CONSULT:    ESRD  HISTORY OF PRESENT ILLNESS:   Rebecca Mullins is a 42 y.o. female, who is referred today for permanent dialysis access.  She is right-handed.  She has scleroderma as well as Raynaud's phenomenon.  She also suffers from polycystic ovarian syndrome and morbid obesity.  She does have issues with shortness of breath with exertion as well as lower extremity edema and heaviness in her leg as well as a pressure sore on her right foot.  PAST MEDICAL HISTORY    Past Medical History:  Diagnosis Date  . Anemia   . Bilateral cellulitis of lower leg   . Glomerulonephritis    Archie Endo 07/25/2015  . Hypertension    "just starting" (07/27/2015)  . Hypothyroidism   . Scleroderma ([place])    "fingers" (07/27/2015)  . Strep throat 1977; winter 2016  . Type II diabetes mellitus ([place]) dx'd 09/2014     FAMILY HISTORY   Family History  Problem Relation Age of Onset  . Diabetes Mother   . Cancer Mother 80       breast  . Hyperlipidemia Father   . Diabetes Brother     SOCIAL HISTORY:   Social History   Socioeconomic History  . Marital status: Single    Spouse name: Not on file  . Number of children: Not on file  . Years of education: Not on file  . Highest education level: Not on file  Social Needs  . Financial resource strain: Not on file  . Food insecurity - worry: Not on file  . Food insecurity - inability: Not on file  . Transportation needs - medical: Not on file  . Transportation needs - non-medical: Not on file  Occupational History  . Not on file  Tobacco Use  . Smoking status: Former Smoker    Packs/day: 1.00    Years: 2.00    Pack years: 2.00    Types: Cigarettes    Start date: 02/28/1993    Last attempt to quit: 03/01/1995    Years since  quitting: 22.3  . Smokeless tobacco: Never Used  Substance and Sexual Activity  . Alcohol use: Yes    Comment: 07/27/2015 "might have a couple glasses of beer or wine q couple weeks"  . Drug use: No  . Sexual activity: Yes    Birth control/protection: Injection  Other Topics Concern  . Not on file  Social History Narrative  . Not on file    ALLERGIES:    Allergies  Allergen Reactions  . Penicillins Anaphylaxis    Has patient had a PCN reaction causing immediate rash, facial/tongue/throat swelling, SOB or lightheadedness with hypotension: Yes Has patient had a PCN reaction causing severe rash involving mucus membranes or skin necrosis: No Has patient had a PCN reaction that required hospitalization No Has patient had a PCN reaction occurring within the last 10 years: No If all of the above answers are "NO", then may proceed with Cephalosporin use.   . Nsaids     Kidney issues  . Oxycodone     Patient says unable to take this medicine.    CURRENT MEDICATIONS:    Current Outpatient Medications  Medication Sig Dispense Refill  . levothyroxine (SYNTHROID, LEVOTHROID) 88 MCG tablet Take 88 mcg by mouth daily.    Marland Kitchen  medroxyPROGESTERone (DEPO-PROVERA) 150 MG/ML injection INJECT 1 ML (150 MG TOTAL) INTO THE MUSCLE EVERY 3 MONTHS 1 mL 1  . mycophenolate (CELLCEPT) 500 MG tablet Take by mouth 2 (two) times daily.    Marland Kitchen NIFEdipine (PROCARDIA-XL/ADALAT-CC/NIFEDICAL-XL) 30 MG 24 hr tablet Take 1 tablet (30 mg total) by mouth daily. 30 tablet 0  . omeprazole (PRILOSEC) 40 MG capsule Take 40 mg by mouth daily.  0  . predniSONE (DELTASONE) 10 MG tablet Take 20 mg by mouth daily.     . Prenat-FeFum-FePo-FA-Omega 3 (CONCEPT DHA) 53.5-38-1 MG CAPS Take 1 capsule by mouth every other day.     . ranitidine (ZANTAC) 150 MG capsule Take 150 mg by mouth 2 (two) times daily.     No current facility-administered medications for this visit.     REVIEW OF SYSTEMS:   [X]  denotes positive finding, [  ] denotes negative finding Cardiac  Comments:  Chest pain or chest pressure:    Shortness of breath upon exertion: xx   Short of breath when lying flat:    Irregular heart rhythm:        Vascular    Pain in calf, thigh, or hip brought on by ambulation:    Pain in feet at night that wakes you up from your sleep:     Blood clot in your veins:    Leg swelling:  xx       Pulmonary    Oxygen at home:    Productive cough:     Wheezing:         Neurologic    Sudden weakness in arms or legs:     Sudden numbness in arms or legs:     Sudden onset of difficulty speaking or slurred speech:    Temporary loss of vision in one eye:     Problems with dizziness:         Gastrointestinal    Blood in stool:      Vomited blood:         Genitourinary    Burning when urinating:     Blood in urine:        Psychiatric    Major depression:         Hematologic    Bleeding problems:    Problems with blood clotting too easily:        Skin    Rashes or ulcers: xx       Constitutional    Fever or chills:     PHYSICAL EXAM:   Vitals:   07/15/17 1438  BP: 100/70  Pulse: 100  Resp: 16  Temp: 98.6 F (37 C)  TempSrc: Oral  SpO2: 98%  Weight: 212 lb (96.2 kg)  Height: 5\' 2"  (1.575 m)    GENERAL: The patient is a well-nourished female, in no acute distress. The vital signs are documented above. CARDIAC: There is a regular rate and rhythm.  VASCULAR: Upper extremity pulses are not palpable.  Bilateral lower extremity edema PULMONARY: Nonlabored respirations ABDOMEN: Soft and non-tender with normal pitched bowel sounds.  MUSCULOSKELETAL: There are no major deformities or cyanosis. NEUROLOGIC: No focal weakness or paresthesias are detected. SKIN: Right leg pressure sore. PSYCHIATRIC: The patient has a normal affect.  STUDIES:   I have evaluated her vein mapping.  She does not have an adequate cephalic or basilic vein in bilateral upper extremities. Arterial duplex shows monophasic  waveforms throughout both upper extremities  ASSESSMENT and PLAN   I spoke with Dr. Marval Regal  regarding the patient's ultrasound findings today.  The patient is in urgent need of starting dialysis, and therefore she will be scheduled for a tunneled dialysis catheter.  Currently, I do not recommend placing an upper arm fistula or graft as she does not have palpable pulses.  Once she has started dialysis, I will consider getting a CT angiogram to see if she has adequate inflow for either a thigh graft or upper extremity graft.  She is also considering peritoneal dialysis.   Annamarie Major, MD Vascular and Vein Specialists of [facility] [phone] Pager [phone]

## 2017-07-16 ENCOUNTER — Other Ambulatory Visit: Payer: Self-pay

## 2017-07-16 ENCOUNTER — Encounter (HOSPITAL_COMMUNITY): Payer: Self-pay | Admitting: *Deleted

## 2017-07-16 NOTE — Progress Notes (Signed)
Spoke with pt for pre-op call. Pt denies cardiac history or chest pain. She has had sob. Pt is a type 2 diabetic. Last A1C was 6.6 on 07/10/17. Pt states she does not check her blood sugar at home and is not on any diabetic medications. Pt is not sure if she has anyone that can pick her up and stay with her for the 24 hours after surgery.

## 2017-07-17 ENCOUNTER — Inpatient Hospital Stay (HOSPITAL_COMMUNITY)
Admission: RE | Admit: 2017-07-17 | Discharge: 2017-07-21 | DRG: 640 | Disposition: A | Discharge status: Home / Self Care | Payer: Medicaid Other | Source: Ambulatory Visit | Attending: Internal Medicine | Admitting: Internal Medicine

## 2017-07-17 ENCOUNTER — Ambulatory Visit (HOSPITAL_COMMUNITY): Payer: Medicaid Other | Admitting: Critical Care Medicine

## 2017-07-17 ENCOUNTER — Encounter (HOSPITAL_COMMUNITY)
Admission: RE | Disposition: A | Discharge status: Home / Self Care | Payer: Self-pay | Source: Ambulatory Visit | Attending: Internal Medicine

## 2017-07-17 ENCOUNTER — Ambulatory Visit (HOSPITAL_COMMUNITY): Payer: Medicaid Other

## 2017-07-17 ENCOUNTER — Other Ambulatory Visit: Payer: Self-pay

## 2017-07-17 ENCOUNTER — Encounter (HOSPITAL_COMMUNITY): Payer: Self-pay | Admitting: *Deleted

## 2017-07-17 DIAGNOSIS — E119 Type 2 diabetes mellitus without complications: Secondary | ICD-10-CM | POA: Diagnosis not present

## 2017-07-17 DIAGNOSIS — D631 Anemia in chronic kidney disease: Secondary | ICD-10-CM | POA: Diagnosis present

## 2017-07-17 DIAGNOSIS — R0603 Acute respiratory distress: Secondary | ICD-10-CM | POA: Diagnosis not present

## 2017-07-17 DIAGNOSIS — Z87891 Personal history of nicotine dependence: Secondary | ICD-10-CM

## 2017-07-17 DIAGNOSIS — Z6838 Body mass index (BMI) 38.0-38.9, adult: Secondary | ICD-10-CM | POA: Diagnosis not present

## 2017-07-17 DIAGNOSIS — M349 Systemic sclerosis, unspecified: Secondary | ICD-10-CM | POA: Diagnosis present

## 2017-07-17 DIAGNOSIS — Z7952 Long term (current) use of systemic steroids: Secondary | ICD-10-CM

## 2017-07-17 DIAGNOSIS — K219 Gastro-esophageal reflux disease without esophagitis: Secondary | ICD-10-CM | POA: Diagnosis present

## 2017-07-17 DIAGNOSIS — J95821 Acute postprocedural respiratory failure: Secondary | ICD-10-CM | POA: Diagnosis present

## 2017-07-17 DIAGNOSIS — E8770 Fluid overload, unspecified: Principal | ICD-10-CM | POA: Diagnosis present

## 2017-07-17 DIAGNOSIS — E876 Hypokalemia: Secondary | ICD-10-CM | POA: Diagnosis present

## 2017-07-17 DIAGNOSIS — N2581 Secondary hyperparathyroidism of renal origin: Secondary | ICD-10-CM | POA: Diagnosis present

## 2017-07-17 DIAGNOSIS — L8990 Pressure ulcer of unspecified site, unspecified stage: Secondary | ICD-10-CM

## 2017-07-17 DIAGNOSIS — E1122 Type 2 diabetes mellitus with diabetic chronic kidney disease: Secondary | ICD-10-CM | POA: Diagnosis present

## 2017-07-17 DIAGNOSIS — N186 End stage renal disease: Secondary | ICD-10-CM | POA: Diagnosis not present

## 2017-07-17 DIAGNOSIS — Z992 Dependence on renal dialysis: Secondary | ICD-10-CM

## 2017-07-17 DIAGNOSIS — Z79890 Hormone replacement therapy (postmenopausal): Secondary | ICD-10-CM

## 2017-07-17 DIAGNOSIS — E039 Hypothyroidism, unspecified: Secondary | ICD-10-CM | POA: Diagnosis present

## 2017-07-17 DIAGNOSIS — I7300 Raynaud's syndrome without gangrene: Secondary | ICD-10-CM | POA: Diagnosis present

## 2017-07-17 DIAGNOSIS — Z79899 Other long term (current) drug therapy: Secondary | ICD-10-CM

## 2017-07-17 DIAGNOSIS — Z95828 Presence of other vascular implants and grafts: Secondary | ICD-10-CM

## 2017-07-17 DIAGNOSIS — I120 Hypertensive chronic kidney disease with stage 5 chronic kidney disease or end stage renal disease: Secondary | ICD-10-CM | POA: Diagnosis present

## 2017-07-17 DIAGNOSIS — G629 Polyneuropathy, unspecified: Secondary | ICD-10-CM | POA: Diagnosis present

## 2017-07-17 DIAGNOSIS — L89612 Pressure ulcer of right heel, stage 2: Secondary | ICD-10-CM | POA: Diagnosis present

## 2017-07-17 DIAGNOSIS — E669 Obesity, unspecified: Secondary | ICD-10-CM | POA: Diagnosis present

## 2017-07-17 DIAGNOSIS — J9621 Acute and chronic respiratory failure with hypoxia: Secondary | ICD-10-CM | POA: Diagnosis not present

## 2017-07-17 DIAGNOSIS — Z833 Family history of diabetes mellitus: Secondary | ICD-10-CM

## 2017-07-17 HISTORY — DX: Anxiety disorder, unspecified: F41.9

## 2017-07-17 HISTORY — DX: Gastro-esophageal reflux disease without esophagitis: K21.9

## 2017-07-17 HISTORY — PX: INSERTION OF DIALYSIS CATHETER: SHX1324

## 2017-07-17 HISTORY — DX: Pneumonia, unspecified organism: J18.9

## 2017-07-17 HISTORY — DX: Other specified symptoms and signs involving the circulatory and respiratory systems: R09.89

## 2017-07-17 HISTORY — DX: Dyspnea, unspecified: R06.00

## 2017-07-17 HISTORY — DX: Chronic kidney disease, unspecified: N18.9

## 2017-07-17 LAB — URINALYSIS, ROUTINE W REFLEX MICROSCOPIC
BACTERIA UA: NONE SEEN
Bilirubin Urine: NEGATIVE
Glucose, UA: NEGATIVE mg/dL
KETONES UR: NEGATIVE mg/dL
LEUKOCYTES UA: NEGATIVE
Nitrite: NEGATIVE
Protein, ur: 100 mg/dL — AB
SPECIFIC GRAVITY, URINE: 1.011 (ref 1.005–1.030)
SQUAMOUS EPITHELIAL / LPF: NONE SEEN
pH: 5 (ref 5.0–8.0)

## 2017-07-17 LAB — HCG, SERUM, QUALITATIVE: Preg, Serum: NEGATIVE

## 2017-07-17 LAB — POCT I-STAT 4, (NA,K, GLUC, HGB,HCT)
GLUCOSE: 89 mg/dL (ref 65–99)
HEMATOCRIT: 29 % — AB (ref 36.0–46.0)
Hemoglobin: 9.9 g/dL — ABNORMAL LOW (ref 12.0–15.0)
Potassium: 3.7 mmol/L (ref 3.5–5.1)
Sodium: 142 mmol/L (ref 135–145)

## 2017-07-17 LAB — CREATININE, SERUM
Creatinine, Ser: 6.72 mg/dL — ABNORMAL HIGH (ref 0.44–1.00)
GFR calc Af Amer: 8 mL/min — ABNORMAL LOW (ref >60)
GFR, EST NON AFRICAN AMERICAN: 7 mL/min — AB (ref >60)

## 2017-07-17 LAB — ALT: ALT: 32 U/L (ref 14–54)

## 2017-07-17 LAB — GLUCOSE, CAPILLARY
GLUCOSE-CAPILLARY: 76 mg/dL (ref 65–99)
Glucose-Capillary: 110 mg/dL — ABNORMAL HIGH (ref 65–99)
Glucose-Capillary: 68 mg/dL (ref 65–99)
Glucose-Capillary: 107 mg/dL — ABNORMAL HIGH (ref 65–99)

## 2017-07-17 LAB — CBC
HCT: 29.7 % — ABNORMAL LOW (ref 36.0–46.0)
HEMOGLOBIN: 9.4 g/dL — AB (ref 12.0–15.0)
MCH: 26.9 pg (ref 26.0–34.0)
MCHC: 31.6 g/dL (ref 30.0–36.0)
MCV: 85.1 fL (ref 78.0–100.0)
Platelets: 327 10*3/uL (ref 150–400)
RBC: 3.49 MIL/uL — AB (ref 3.87–5.11)
RDW: 15.7 % — ABNORMAL HIGH (ref 11.5–15.5)
WBC: 13.3 10*3/uL — ABNORMAL HIGH (ref 4.0–10.5)

## 2017-07-17 SURGERY — INSERTION OF DIALYSIS CATHETER
Anesthesia: Monitor Anesthesia Care | Site: Neck | Laterality: Right

## 2017-07-17 MED ORDER — MYCOPHENOLATE MOFETIL 250 MG PO CAPS
1500.0000 mg | ORAL_CAPSULE | Freq: Two times a day (BID) | ORAL | Status: DC
  Administered 2017-07-17 – 2017-07-21 (×7): 1500 mg via ORAL
  Filled 2017-07-17 (×7): qty 6

## 2017-07-17 MED ORDER — PROMETHAZINE HCL 25 MG/ML IJ SOLN: 6.2500 mg | INTRAMUSCULAR | Status: DC | PRN

## 2017-07-17 MED ORDER — HYDROMORPHONE HCL 1 MG/ML IJ SOLN: 0.5000 mg | Freq: Once | INTRAMUSCULAR | Status: DC

## 2017-07-17 MED ORDER — HYDROMORPHONE HCL 1 MG/ML IJ SOLN: 0.2500 mg | INTRAMUSCULAR | Status: DC | PRN

## 2017-07-17 MED ORDER — ONDANSETRON HCL 4 MG/2ML IJ SOLN: 4.0000 mg | Freq: Three times a day (TID) | INTRAMUSCULAR | Status: DC | PRN

## 2017-07-17 MED ORDER — VANCOMYCIN HCL 1000 MG IV SOLR
INTRAVENOUS | Status: DC | PRN
  Administered 2017-07-17: 1000 mg via INTRAVENOUS

## 2017-07-17 MED ORDER — ONDANSETRON HCL 4 MG/2ML IJ SOLN
INTRAMUSCULAR | Status: DC | PRN
  Administered 2017-07-17: 4 mg via INTRAVENOUS

## 2017-07-17 MED ORDER — SULFAMETHOXAZOLE-TRIMETHOPRIM 800-160 MG PO TABS: 1.0000 | ORAL_TABLET | ORAL | Status: DC

## 2017-07-17 MED ORDER — LIDOCAINE HCL (PF) 1 % IJ SOLN: 5.0000 mL | INTRAMUSCULAR | Status: DC | PRN

## 2017-07-17 MED ORDER — LIDOCAINE HCL (CARDIAC) 20 MG/ML IV SOLN
INTRAVENOUS | Status: DC | PRN
  Administered 2017-07-17: 100 mg via INTRAVENOUS

## 2017-07-17 MED ORDER — ACETAMINOPHEN 325 MG PO TABS
650.0000 mg | ORAL_TABLET | Freq: Four times a day (QID) | ORAL | Status: DC | PRN
  Administered 2017-07-17 – 2017-07-20 (×8): 650 mg via ORAL
  Filled 2017-07-17 (×7): qty 2

## 2017-07-17 MED ORDER — SODIUM CHLORIDE 0.9 % IV SOLN: INTRAVENOUS | Status: DC

## 2017-07-17 MED ORDER — SODIUM CHLORIDE 0.9 % IV SOLN: 100.0000 mL | INTRAVENOUS | Status: DC | PRN

## 2017-07-17 MED ORDER — FERROUS SULFATE 325 (65 FE) MG PO TABS
325.0000 mg | ORAL_TABLET | ORAL | Status: DC
  Administered 2017-07-18: 325 mg via ORAL
  Filled 2017-07-17: qty 1

## 2017-07-17 MED ORDER — PENTAFLUOROPROP-TETRAFLUOROETH EX AERO: 1.0000 "application " | INHALATION_SPRAY | CUTANEOUS | Status: DC | PRN

## 2017-07-17 MED ORDER — SODIUM BICARBONATE 650 MG PO TABS
650.0000 mg | ORAL_TABLET | Freq: Three times a day (TID) | ORAL | Status: DC
  Administered 2017-07-17 – 2017-07-18 (×2): 650 mg via ORAL
  Filled 2017-07-17 (×2): qty 1

## 2017-07-17 MED ORDER — SODIUM CHLORIDE 0.9 % IV SOLN
INTRAVENOUS | Status: DC
  Administered 2017-07-17 (×2): via INTRAVENOUS

## 2017-07-17 MED ORDER — LIDOCAINE 2% (20 MG/ML) 5 ML SYRINGE
INTRAMUSCULAR | Status: AC
  Filled 2017-07-17: qty 5

## 2017-07-17 MED ORDER — LEVOTHYROXINE SODIUM 88 MCG PO TABS
88.0000 ug | ORAL_TABLET | Freq: Every day | ORAL | Status: DC
  Administered 2017-07-18 – 2017-07-21 (×4): 88 ug via ORAL
  Filled 2017-07-17 (×4): qty 1

## 2017-07-17 MED ORDER — LIDOCAINE-EPINEPHRINE 0.5 %-1:200000 IJ SOLN
INTRAMUSCULAR | Status: DC | PRN
  Administered 2017-07-17: 4 mL

## 2017-07-17 MED ORDER — PROPOFOL 500 MG/50ML IV EMUL
INTRAVENOUS | Status: DC | PRN
  Administered 2017-07-17: 100 ug/kg/min via INTRAVENOUS

## 2017-07-17 MED ORDER — DEXTROSE 50 % IV SOLN
25.0000 mL | INTRAVENOUS | Status: AC
  Administered 2017-07-17: 25 mL via INTRAVENOUS
  Filled 2017-07-17: qty 50

## 2017-07-17 MED ORDER — MIDAZOLAM HCL 2 MG/2ML IJ SOLN
INTRAMUSCULAR | Status: AC
  Filled 2017-07-17: qty 2

## 2017-07-17 MED ORDER — FENTANYL CITRATE (PF) 250 MCG/5ML IJ SOLN
INTRAMUSCULAR | Status: AC
  Filled 2017-07-17: qty 5

## 2017-07-17 MED ORDER — VITAMIN D (ERGOCALCIFEROL) 1.25 MG (50000 UNIT) PO CAPS: 50000.0000 [IU] | ORAL_CAPSULE | ORAL | Status: DC

## 2017-07-17 MED ORDER — HEPARIN SODIUM (PORCINE) 1000 UNIT/ML IJ SOLN
INTRAMUSCULAR | Status: AC
  Filled 2017-07-17: qty 1

## 2017-07-17 MED ORDER — LIDOCAINE-EPINEPHRINE 0.5 %-1:200000 IJ SOLN
INTRAMUSCULAR | Status: AC
  Filled 2017-07-17: qty 1

## 2017-07-17 MED ORDER — SODIUM CHLORIDE 0.9 % IV SOLN
INTRAVENOUS | Status: DC | PRN
  Administered 2017-07-17: 500 mL

## 2017-07-17 MED ORDER — HEPARIN SODIUM (PORCINE) 1000 UNIT/ML IJ SOLN
INTRAMUSCULAR | Status: DC | PRN
  Administered 2017-07-17: 327.08 [IU] via INTRAVENOUS

## 2017-07-17 MED ORDER — HYDROMORPHONE HCL 1 MG/ML IJ SOLN
0.2500 mg | Freq: Once | INTRAMUSCULAR | Status: AC
  Administered 2017-07-17: 0.5 mg via INTRAVENOUS
  Filled 2017-07-17: qty 0.5

## 2017-07-17 MED ORDER — HEPARIN SODIUM (PORCINE) 5000 UNIT/ML IJ SOLN
5000.0000 [IU] | Freq: Three times a day (TID) | INTRAMUSCULAR | Status: DC
  Administered 2017-07-17 – 2017-07-21 (×8): 5000 [IU] via SUBCUTANEOUS
  Filled 2017-07-17 (×8): qty 1

## 2017-07-17 MED ORDER — SULFAMETHOXAZOLE-TRIMETHOPRIM 800-160 MG PO TABS
1.0000 | ORAL_TABLET | ORAL | Status: DC
  Administered 2017-07-17 – 2017-07-19 (×3): 1 via ORAL
  Filled 2017-07-17 (×3): qty 1

## 2017-07-17 MED ORDER — PREDNISONE 20 MG PO TABS
20.0000 mg | ORAL_TABLET | Freq: Every day | ORAL | Status: DC
  Administered 2017-07-18 – 2017-07-21 (×4): 20 mg via ORAL
  Filled 2017-07-17 (×4): qty 1

## 2017-07-17 MED ORDER — BACITRACIN-NEOMYCIN-POLYMYXIN OINTMENT TUBE
TOPICAL_OINTMENT | Freq: Two times a day (BID) | CUTANEOUS | Status: DC
  Administered 2017-07-17 – 2017-07-19 (×3): via TOPICAL
  Filled 2017-07-17: qty 14.17

## 2017-07-17 MED ORDER — FENTANYL CITRATE (PF) 100 MCG/2ML IJ SOLN
INTRAMUSCULAR | Status: DC | PRN
  Administered 2017-07-17: 50 ug via INTRAVENOUS
  Administered 2017-07-17: 100 ug via INTRAVENOUS
  Administered 2017-07-17 (×2): 25 ug via INTRAVENOUS

## 2017-07-17 MED ORDER — 0.9 % SODIUM CHLORIDE (POUR BTL) OPTIME
TOPICAL | Status: DC | PRN
  Administered 2017-07-17: 1000 mL

## 2017-07-17 MED ORDER — HEPARIN SODIUM (PORCINE) 1000 UNIT/ML DIALYSIS
1000.0000 [IU] | INTRAMUSCULAR | Status: DC | PRN
  Administered 2017-07-17: 1000 [IU] via INTRAVENOUS_CENTRAL
  Filled 2017-07-17: qty 1

## 2017-07-17 MED ORDER — VANCOMYCIN HCL IN DEXTROSE 1-5 GM/200ML-% IV SOLN
1000.0000 mg | INTRAVENOUS | Status: DC
  Filled 2017-07-17: qty 200

## 2017-07-17 MED ORDER — MIDAZOLAM HCL 2 MG/2ML IJ SOLN
INTRAMUSCULAR | Status: DC | PRN
  Administered 2017-07-17: 2 mg via INTRAVENOUS

## 2017-07-17 MED ORDER — DEXAMETHASONE SODIUM PHOSPHATE 10 MG/ML IJ SOLN
INTRAMUSCULAR | Status: AC
  Filled 2017-07-17: qty 1

## 2017-07-17 MED ORDER — ALTEPLASE 2 MG IJ SOLR: 2.0000 mg | Freq: Once | INTRAMUSCULAR | Status: DC | PRN

## 2017-07-17 MED ORDER — CHLORHEXIDINE GLUCONATE CLOTH 2 % EX PADS: 6.0000 | MEDICATED_PAD | Freq: Once | CUTANEOUS | Status: DC

## 2017-07-17 MED ORDER — LIDOCAINE-PRILOCAINE 2.5-2.5 % EX CREA: 1.0000 "application " | TOPICAL_CREAM | CUTANEOUS | Status: DC | PRN

## 2017-07-17 MED ORDER — PANTOPRAZOLE SODIUM 40 MG PO TBEC
40.0000 mg | DELAYED_RELEASE_TABLET | Freq: Every day | ORAL | Status: DC
  Administered 2017-07-18 – 2017-07-21 (×4): 40 mg via ORAL
  Filled 2017-07-17 (×4): qty 1

## 2017-07-17 MED ORDER — PHENYLEPHRINE HCL 10 MG/ML IJ SOLN
INTRAMUSCULAR | Status: DC | PRN
  Administered 2017-07-17 (×6): 120 ug via INTRAVENOUS

## 2017-07-17 MED ORDER — ACETAMINOPHEN 650 MG RE SUPP: 650.0000 mg | Freq: Four times a day (QID) | RECTAL | Status: DC | PRN

## 2017-07-17 MED ORDER — ONDANSETRON HCL 4 MG/2ML IJ SOLN
INTRAMUSCULAR | Status: AC
  Filled 2017-07-17: qty 2

## 2017-07-17 MED ORDER — PROPOFOL 10 MG/ML IV BOLUS
INTRAVENOUS | Status: DC | PRN
  Administered 2017-07-17: 80 mg via INTRAVENOUS
  Administered 2017-07-17: 30 mg via INTRAVENOUS

## 2017-07-17 MED ORDER — PROPOFOL 10 MG/ML IV BOLUS
INTRAVENOUS | Status: AC
  Filled 2017-07-17: qty 20

## 2017-07-17 MED ORDER — FAMOTIDINE 20 MG PO TABS
20.0000 mg | ORAL_TABLET | Freq: Every day | ORAL | Status: DC
  Administered 2017-07-17 – 2017-07-20 (×4): 20 mg via ORAL
  Filled 2017-07-17 (×4): qty 1

## 2017-07-17 MED ORDER — DEXTROSE 50 % IV SOLN
INTRAVENOUS | Status: AC
  Administered 2017-07-17: 25 mL via INTRAVENOUS
  Filled 2017-07-17: qty 50

## 2017-07-17 SURGICAL SUPPLY — 41 items
BAG DECANTER FOR FLEXI CONT (MISCELLANEOUS) ×3 IMPLANT
BIOPATCH RED 1 DISK 7.0 (GAUZE/BANDAGES/DRESSINGS) ×2 IMPLANT
BIOPATCH RED 1IN DISK 7.0MM (GAUZE/BANDAGES/DRESSINGS) ×1
CATH PALINDROME RT-P 15FX19CM (CATHETERS) IMPLANT
CATH PALINDROME RT-P 15FX23CM (CATHETERS) IMPLANT
CATH PALINDROME RT-P 15FX28CM (CATHETERS) IMPLANT
CATH PALINDROME RT-P 15FX55CM (CATHETERS) IMPLANT
COVER PROBE W GEL 5X96 (DRAPES) IMPLANT
COVER SURGICAL LIGHT HANDLE (MISCELLANEOUS) ×3 IMPLANT
DECANTER SPIKE VIAL GLASS SM (MISCELLANEOUS) ×3 IMPLANT
DERMABOND ADHESIVE PROPEN (GAUZE/BANDAGES/DRESSINGS) ×2
DERMABOND ADVANCED (GAUZE/BANDAGES/DRESSINGS)
DERMABOND ADVANCED .7 DNX12 (GAUZE/BANDAGES/DRESSINGS) IMPLANT
DERMABOND ADVANCED .7 DNX6 (GAUZE/BANDAGES/DRESSINGS) ×1 IMPLANT
DRAPE C-ARM 42X72 X-RAY (DRAPES) ×3 IMPLANT
DRAPE CHEST BREAST 15X10 FENES (DRAPES) ×3 IMPLANT
DRSG COVADERM 4X6 (GAUZE/BANDAGES/DRESSINGS) ×3 IMPLANT
GAUZE SPONGE 2X2 8PLY STRL LF (GAUZE/BANDAGES/DRESSINGS) ×1 IMPLANT
GLOVE SS BIOGEL STRL SZ 7.5 (GLOVE) ×1 IMPLANT
GLOVE SUPERSENSE BIOGEL SZ 7.5 (GLOVE) ×2
GOWN STRL REUS W/ TWL LRG LVL3 (GOWN DISPOSABLE) ×2 IMPLANT
GOWN STRL REUS W/TWL LRG LVL3 (GOWN DISPOSABLE) ×4
KIT BASIN OR (CUSTOM PROCEDURE TRAY) ×3 IMPLANT
KIT ROOM TURNOVER OR (KITS) ×3 IMPLANT
NEEDLE 18GX1X1/2 (RX/OR ONLY) (NEEDLE) ×3 IMPLANT
NEEDLE 22X1 1/2 (OR ONLY) (NEEDLE) IMPLANT
NEEDLE HYPO 25GX1X1/2 BEV (NEEDLE) ×3 IMPLANT
NS IRRIG 1000ML POUR BTL (IV SOLUTION) ×3 IMPLANT
PACK SURGICAL SETUP 50X90 (CUSTOM PROCEDURE TRAY) ×3 IMPLANT
PAD ARMBOARD 7.5X6 YLW CONV (MISCELLANEOUS) ×6 IMPLANT
SOAP 2 % CHG 4 OZ (WOUND CARE) ×3 IMPLANT
SPONGE GAUZE 2X2 STER 10/PKG (GAUZE/BANDAGES/DRESSINGS) ×2
SUT ETHILON 3 0 PS 1 (SUTURE) ×3 IMPLANT
SUT VICRYL 4-0 PS2 18IN ABS (SUTURE) ×3 IMPLANT
SYR 10ML LL (SYRINGE) ×3 IMPLANT
SYR 20CC LL (SYRINGE) ×3 IMPLANT
SYR 5ML LL (SYRINGE) ×6 IMPLANT
SYR CONTROL 10ML LL (SYRINGE) ×3 IMPLANT
TOWEL GREEN STERILE (TOWEL DISPOSABLE) ×6 IMPLANT
TOWEL GREEN STERILE FF (TOWEL DISPOSABLE) ×3 IMPLANT
WATER STERILE IRR 1000ML POUR (IV SOLUTION) ×3 IMPLANT

## 2017-07-17 NOTE — H&P (Signed)
History and Physical:    Rebecca Mullins   MWN:[id number] DOB: 19-Nov-1975 DOA: 07/17/2017  Referring MD/provider: Dr. Joelyn Oms PCP: [name], FNP  [facility] for rheumatology.  Patient coming from:   Chief Complaint: Respiratory failure post procedure.  History of Present Illness:   Rebecca Mullins is an 42 y.o. 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 Curt Jews today, and who developed acute respiratory distress following the procedure resulting in a brief episode of being intubated. She was successfully extubated and nephrology was consulted for initiation of hemodialysis. Nephrology subsequently asked me to admit the patient. Patient was seen and examined up at the hemodialysis unit. She reports that after the procedure, she had a cough with hemoptysis and a scratchy throat.  ROS:   Review of Systems  Constitutional: Positive for malaise/fatigue and weight loss. Negative for chills and fever.  HENT: Positive for congestion.        Phlegm in back of throat.  Eyes: Negative.   Respiratory: Positive for cough, hemoptysis, sputum production and shortness of breath. Negative for wheezing.   Cardiovascular: Negative.   Gastrointestinal: Positive for heartburn, nausea and vomiting. Negative for blood in stool and melena.  Genitourinary: Positive for hematuria.  Musculoskeletal: Positive for myalgias.  Skin:       Scleroderma  Neurological: Positive for weakness.    Past Medical History:   Past Medical History:  Diagnosis Date  . Anemia   . Anxiety   . Bilateral cellulitis of lower leg   . Chronic kidney disease    stage 5  . Dyspnea   . GERD (gastroesophageal reflux disease)   . Glomerulonephritis    Archie Endo 07/25/2015  . Hypertension    "just starting" (07/27/2015)  . Hypothyroidism   . Pneumonia   . Poor circulation     Raynaud's disease  . Scleroderma ([place])    "fingers" (07/27/2015)  . Strep throat 1977; winter 2016  . Type II diabetes mellitus ([place]) dx'd 09/2014    Past Surgical History:   Past Surgical History:  Procedure Laterality Date  . RENAL BIOPSY Left 07/27/2015   US guided medical renal biopsy    Social History:   Social History   Socioeconomic History  . Marital status: Single    Spouse name: Not on file  . Number of children: 0  . Years of education: Not on file  . Highest education level: Not on file  Social Needs  . Financial resource strain: Not on file  . Food insecurity - worry: Not on file  . Food insecurity - inability: Not on file  . Transportation needs - medical: Not on file  . Transportation needs - non-medical: Not on file  Occupational History  . Occupation: Disabled.  Tobacco Use  . Smoking status: Former Smoker    Packs/day: 1.00    Years: 2.00    Pack years: 2.00    Types: Cigarettes    Start date: 02/28/1993    Last attempt to quit: 03/01/1995    Years since quitting: 22.3  . Smokeless tobacco: Never Used  Substance and Sexual Activity  . Alcohol use: Yes    Comment: 07/27/2015 "might have a couple glasses of beer or wine q couple weeks"  . Drug use: No  . Sexual activity: Yes    Birth control/protection: Injection  Other Topics Concern  . Not on file  Social History Narrative   Single. Lives alone. Disabled.    Allergies   Penicillins; Nsaids; and Oxycodone  Family history:   Family History  Problem Relation Age of Onset  . Diabetes Mother   . Cancer Mother 27       breast  . Hyperlipidemia Father   . Diabetes Brother     Current Medications:   Prior to Admission medications   Medication Sig Start Date End Date Taking? Authorizing Provider  ferrous sulfate 325 (65 FE) MG EC tablet Take 325 mg by mouth every other day.   Yes [provider]  levothyroxine (SYNTHROID, LEVOTHROID) 88 MCG tablet Take 88 mcg by mouth daily.    Yes [provider]  medroxyPROGESTERone (DEPO-PROVERA) 150 MG/ML injection INJECT 1 ML (150 MG TOTAL) INTO THE MUSCLE EVERY 3 MONTHS 03/05/16  Yes English, Stephanie D, PA  mycophenolate (CELLCEPT) 500 MG tablet Take 1,500 mg by mouth every 12 (twelve) hours.    Yes [provider]  omeprazole (PRILOSEC) 40 MG capsule Take 40 mg by mouth 2 (two) times daily.  05/03/16  Yes [provider]  predniSONE (DELTASONE) 10 MG tablet Take 20 mg by mouth daily.  06/14/16  Yes [provider]  ranitidine (ZANTAC) 150 MG capsule Take 150 mg by mouth at bedtime as needed for heartburn.  04/27/16 07/17/17 Yes [provider]  sodium bicarbonate 325 MG tablet Take 650 mg by mouth 3 (three) times daily.   Yes [provider]  sulfamethoxazole-trimethoprim (BACTRIM DS,SEPTRA DS) 800-160 MG tablet Take 1 tablet by mouth See admin instructions. Twice daily on M W F   Yes [provider]  Vitamin D, Ergocalciferol, (DRISDOL) 50000 units CAPS capsule Take 50,000 Units by mouth every 7 (seven) days.   Yes [provider]  NIFEdipine (PROCARDIA-XL/ADALAT-CC/NIFEDICAL-XL) 30 MG 24 hr tablet Take 1 tablet (30 mg total) by mouth daily. Patient not taking: Reported on 07/17/2017 05/30/16   Melony Overly, MD    Physical Exam:   Vitals:   07/17/17 1830 07/17/17 1900 07/17/17 1904 07/17/17 1930  BP: 129/87 (!) 45/23 111/72 138/86  Pulse: 90 (!) 37  64  Resp: (!) 29 (!) 25  (!) 25  Temp:      TempSrc:      SpO2: 95% 94%  95%  Weight:      Height:         Physical Exam: Blood pressure 138/86, pulse 64, temperature 98.2 F (36.8 C), temperature source Oral, resp. rate (!) 25, height 5\' 2"  (1.575 m), weight 96.2 kg (212 lb), SpO2 95 %, unknown if currently breastfeeding. Gen: Chronically ill appearing female. Head: Normocephalic, atraumatic. Eyes: Pupils equal, round and reactive to light. Extraocular movements intact.  Sclerae nonicteric. No lid  lag. Mouth: Oropharynx reveals moist mucous membranes. Dentition is intact. Neck: Supple, no thyromegaly, no lymphadenopathy, no jugular venous distention. Chest: Lungs are clear to auscultation with good air movement. No rales, rhonchi or wheezes. Tunneled catheter right upper chest wall. CV: Heart sounds are regular with an S1, S2. No murmurs, rubs, clicks, or gallops.  Abdomen: Soft, nontender, nondistended with normal active bowel sounds. No hepatosplenomegaly or palpable masses. Extremities: Extremities are without clubbing, or cyanosis. 2+ edema. Unable to palpate pedal pulses..  Skin: Pale and sallow with dry flaking skin. Pressure sore right heel with bandages intact. Neuro: Alert and oriented times 3; grossly nonfocal.  Psych: Insight is good and judgment is appropriate. Mood and affect normal.   Data  Review:    Labs: Basic Metabolic Panel: Recent Labs  Lab 07/17/17 1228  NA 142  K 3.7  GLUCOSE 89   Liver Function Tests: Recent Labs  Lab 07/17/17 1838  ALT 32   CBC: Recent Labs  Lab 07/17/17 1228  HGB 9.9*  HCT 29.0*   CBG: Recent Labs  Lab 07/17/17 1435 07/17/17 1455 07/17/17 1617  GLUCAP 68 110* 107*    Urinalysis    Component Value Date/Time   COLORURINE STRAW (A) 07/17/2017 1303   APPEARANCEUR CLEAR 07/17/2017 1303   LABSPEC 1.011 07/17/2017 1303   PHURINE 5.0 07/17/2017 1303   GLUCOSEU NEGATIVE 07/17/2017 1303   HGBUR MODERATE (A) 07/17/2017 1303   BILIRUBINUR NEGATIVE 07/17/2017 1303   BILIRUBINUR negative 04/21/2015 1823   BILIRUBINUR neg 03/20/2015 1421   KETONESUR NEGATIVE 07/17/2017 1303   PROTEINUR 100 (A) 07/17/2017 1303   UROBILINOGEN 0.2 04/21/2015 1823   UROBILINOGEN 0.2 01/13/2015 1800   NITRITE NEGATIVE 07/17/2017 1303   LEUKOCYTESUR NEGATIVE 07/17/2017 1303      Radiographic Studies: Dg Chest Port 1 View 07/17/2017: My independent review of the image shows: Mild pulmonary edema with a new right permacath. No evidence of  pneumothorax.   EKG: Independently reviewed. QT interval 487 ms, normal sinus rhythm at 95 bpm with nonspecific T-wave abnormalities.   Assessment/Plan:   Principal Problem:   Acute respiratory distress in the setting of end-stage renal disease in the setting of glomerulonephritis Taken for urgent hemodialysis on admission. Suspect acute respiratory distress related to flash pulmonary edema. Volume will be removed with hemodialysis. Nephrology consulted to arrange for outpatient dialysis. Assuming outpatient dialysis can be arranged, the patient will likely be stable for discharge tomorrow.  Active Problems:   Scleroderma ([place]) Continue immunosuppressive therapy.    Diet-controlled diabetes mellitus ([place]) Carbohydrate modified diet ordered.    HIV screening The patient falls between the ages of 13-64 and should be screened for HIV, therefore HIV testing ordered.    Obesity Body mass index is 38.78 kg/m.  Other information:   DVT prophylaxis: Heparin ordered. Code Status: Full code. Family Communication: No family at bedside. Disposition Plan: Likely home 07/18/17. Consults called: Dr. Joelyn Oms of nephrology following. Admission status: Observation.   Margreta Journey Victoire Deans [facility] Pager [phone] Cell: [phone]   If 7PM-7AM, please contact night-coverage [email or web address] Password [email or web address] 07/17/2017, 8:14 PM

## 2017-07-17 NOTE — Transfer of Care (Signed)
Immediate Anesthesia Transfer of Care Note  Patient: Rebecca Mullins  Procedure(s) Performed: INSERTION OF Right Internal Jugular DIALYSIS  CATHETER (Right Neck)  Patient Location: PACU  Anesthesia Type:General  Level of Consciousness: awake and alert   Airway & Oxygen Therapy: Patient Spontanous Breathing and Patient connected to face mask oxygen  Post-op Assessment: Report given to RN and Post -op Vital signs reviewed and stable  Post vital signs: Reviewed and stable  Last Vitals:  Vitals:   07/17/17 1217 07/17/17 1615  BP: 114/79 103/68  Pulse: 97 (!) 104  Resp: 18 12  Temp: 36.6 C 36.6 C  SpO2: 98% 100%    Last Pain:  Vitals:   07/17/17 1615  TempSrc:   PainSc: (P) 0-No pain      Patients Stated Pain Goal: 7 (15/40/08 6761)  Complications: No apparent anesthesia complications

## 2017-07-17 NOTE — Progress Notes (Signed)
HD tx initiated via HD cath w/o problem, pull/push/flush equally w/o problem, VSS, will cont to monitor while on HD tx 

## 2017-07-17 NOTE — Interval H&P Note (Signed)
History and Physical Interval Note:  07/17/2017 12:56 PM  Rebecca Mullins  has presented today for surgery, with the diagnosis of CHRONIC KIDNEY DISEASE STAGE V  The various methods of treatment have been discussed with the patient and family. After consideration of risks, benefits and other options for treatment, the patient has consented to  Procedure(s): INSERTION OF DIALYSIS [place] (N/A) as a surgical intervention .  The patient's history has been reviewed, patient examined, no change in status, stable for surgery.  I have reviewed the patient's chart and labs.  Questions were answered to the patient's satisfaction.     Curt Jews

## 2017-07-17 NOTE — Progress Notes (Signed)
HD tx completed @ 2050 w/ bp issues throughout tx requiring UF off and 400 ml in NS boluses, UF goal not met, blood rinsed back however for a 2.5 hr tx pt started clotting system and had BFR down to 200 by end of tx, and will need heparin for tx in the future if it is not contraindicated, will leave word for Nephrologists to assess this, report called to Percell Boston, RN

## 2017-07-17 NOTE — Anesthesia Preprocedure Evaluation (Addendum)
Anesthesia Evaluation  Patient identified by MRN, date of birth, ID band Patient awake    Reviewed: Allergy & Precautions, NPO status , Patient's Chart, lab work & pertinent test results  Airway Mallampati: III  TM Distance: >3 FB Neck ROM: Full    Dental  (+) Loose,    Pulmonary former smoker,    Pulmonary exam normal breath sounds clear to auscultation       Cardiovascular hypertension, Pt. on medications Normal cardiovascular exam Rhythm:Regular Rate:Normal  ECG: NSR, rate 95   Neuro/Psych Anxiety negative neurological ROS     GI/Hepatic Neg liver ROS, GERD  Medicated and Controlled,  Endo/Other  Hypothyroidism   Renal/GU ESRFRenal disease     Musculoskeletal Scleroderma    Abdominal (+) + obese,   Peds  Hematology  (+) anemia ,   Anesthesia Other Findings  CHRONIC KIDNEY DISEASE STAGE V  Reproductive/Obstetrics                            Anesthesia Physical Anesthesia Plan  ASA: III  Anesthesia Plan: MAC   Post-op Pain Management:    Induction: Intravenous  PONV Risk Score and Plan: 2 and Propofol infusion and Treatment may vary due to age or medical condition  Airway Management Planned: Natural Airway  Additional Equipment:   Intra-op Plan:   Post-operative Plan:   Informed Consent: I have reviewed the patients History and Physical, chart, labs and discussed the procedure including the risks, benefits and alternatives for the proposed anesthesia with the patient or authorized representative who has indicated his/her understanding and acceptance.   Dental advisory given  Plan Discussed with: CRNA  Anesthesia Plan Comments:         Anesthesia Quick Evaluation

## 2017-07-17 NOTE — Consult Note (Signed)
[facility] Renal Consultation Note    Indication for Consultation:  Management of ESRD/hemodialysis, anemia, hypertension/volume, and secondary hyperparathyroidism. PCP:  HPI: Rebecca Mullins is a 42 y.o. 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   Pt reports that has been having DOE and orthopnea for past few days. No N/V or appetite loss. No fever or chills. After [facility] placement today, she had a coughing/respiratory distress spell requiring short-term ?intubation. At time of our interview, she was awake and extubated, breathing comfortably on room air. She did cough up what appeared to be red-tinged mucus. CXR ordered, pending read.  Past Medical History:  Diagnosis Date  . Anemia   . Anxiety   . Bilateral cellulitis of lower leg   . Chronic kidney disease    stage 5  . Dyspnea   . GERD (gastroesophageal reflux disease)   . Glomerulonephritis    Archie Endo 07/25/2015  . Hypertension    "just starting" (07/27/2015)  . Hypothyroidism   . Pneumonia   . Poor circulation    Raynaud's disease  . Scleroderma ([place])    "fingers" (07/27/2015)  . Strep throat 1977; winter 2016  . Type II diabetes mellitus ([place]) dx'd 09/2014   Past Surgical History:  Procedure Laterality Date  . RENAL BIOPSY Left 07/27/2015   US guided medical renal biopsy   Family History  Problem Relation Age of Onset  . Diabetes Mother   . Cancer Mother 85       breast  . Hyperlipidemia Father   . Diabetes Brother    Social History:  reports that she quit smoking about 22 years ago. Her smoking use included cigarettes. She started smoking about 24 years ago. She has a 2.00 pack-year smoking history. she has never used smokeless tobacco. She reports that  she drinks alcohol. She reports that she does not use drugs.  ROS: As per HPI otherwise negative.  Physical Exam: Vitals:   07/17/17 1217 07/17/17 1615 07/17/17 1630  BP: 114/79 103/68 94/83  Pulse: 97 (!) 104 (!) 119  Resp: 18 12 (!) 21  Temp: 97.8 F (36.6 C) 97.9 F (36.6 C)   TempSrc: Oral    SpO2: 98% 100% 100%  Weight: 96.2 kg (212 lb)    Height: 5\' 2"  (1.575 m)       General: Well developed, well nourished, in no acute distress. Head: Normocephalic, atraumatic, sclera non-icteric, mucus membranes are moist. Neck: Supple without lymphadenopathy/masses. JVD not elevated. Lungs: Clear bilaterally to auscultation without wheezes, rales, or rhonchi. Breathing is unlabored. Heart: RRR with normal S1, S2. No murmurs, rubs, or gallops appreciated. Abdomen: Soft, non-tender, non-distended with normoactive bowel sounds.  Musculoskeletal:  Strength and tone appear normal for age. Lower extremities: Trace LE edema. Bandaged R heel (says ulcer present, not examined) Neuro: Alert and oriented X 3. Moves all extremities spontaneously. Psych:  Responds to questions appropriately with a normal affect. Dialysis Access: TDC in R chest (newly placed)  Allergies  Allergen Reactions  . Penicillins Anaphylaxis    Has patient had a PCN reaction causing immediate rash, facial/tongue/throat swelling, SOB or lightheadedness with hypotension: Yes Has patient had a PCN reaction causing severe rash involving mucus membranes or skin necrosis: No Has patient had a PCN reaction that required hospitalization No Has  patient had a PCN reaction occurring within the last 10 years: No If all of the above answers are "NO", then may proceed with Cephalosporin use.   . Nsaids     Kidney issues  . Oxycodone     Patient says unable to take this medicine.   Prior to Admission medications   Medication Sig Start Date End Date Taking? Authorizing Provider  ferrous sulfate 325 (65 FE) MG EC tablet Take 325 mg by  mouth every other day.   Yes [provider]  levothyroxine (SYNTHROID, LEVOTHROID) 88 MCG tablet Take 88 mcg by mouth daily.   Yes [provider]  medroxyPROGESTERone (DEPO-PROVERA) 150 MG/ML injection INJECT 1 ML (150 MG TOTAL) INTO THE MUSCLE EVERY 3 MONTHS 03/05/16  Yes English, Stephanie D, PA  mycophenolate (CELLCEPT) 500 MG tablet Take 1,500 mg by mouth every 12 (twelve) hours.    Yes [provider]  omeprazole (PRILOSEC) 40 MG capsule Take 40 mg by mouth 2 (two) times daily.  05/03/16  Yes [provider]  predniSONE (DELTASONE) 10 MG tablet Take 20 mg by mouth daily.  06/14/16  Yes [provider]  ranitidine (ZANTAC) 150 MG capsule Take 150 mg by mouth at bedtime as needed for heartburn.  04/27/16 07/17/17 Yes [provider]  sodium bicarbonate 325 MG tablet Take 650 mg by mouth 3 (three) times daily.   Yes [provider]  sulfamethoxazole-trimethoprim (BACTRIM DS,SEPTRA DS) 800-160 MG tablet Take 1 tablet by mouth See admin instructions. Twice daily on M W F   Yes [provider]  Vitamin D, Ergocalciferol, (DRISDOL) 50000 units CAPS capsule Take 50,000 Units by mouth every 7 (seven) days.   Yes [provider]  NIFEdipine (PROCARDIA-XL/ADALAT-CC/NIFEDICAL-XL) 30 MG 24 hr tablet Take 1 tablet (30 mg total) by mouth daily. Patient not taking: Reported on 07/17/2017 05/30/16   Melony Overly, MD   Current Facility-Administered Medications  Medication Dose Route Frequency Provider Last Rate Last Dose  . 0.9 %  sodium chloride infusion   Intravenous Continuous Duane Boston, MD 10 mL/hr at 07/17/17 1240    . 0.9 %  sodium chloride infusion   Intravenous Continuous Early, Arvilla Meres, MD      . Chlorhexidine Gluconate Cloth 2 % PADS 6 each  6 each Topical Once Early, Arvilla Meres, MD       And  . Chlorhexidine Gluconate Cloth 2 % PADS 6 each  6 each Topical Once Early, Arvilla Meres, MD      . HYDROmorphone (DILAUDID) injection  0.25-0.5 mg  0.25-0.5 mg Intravenous Q5 min PRN Ellender, [name], MD      . promethazine (PHENERGAN) injection 6.25-12.5 mg  6.25-12.5 mg Intravenous Q15 min PRN Ellender, [name], MD      . vancomycin (VANCOCIN) IVPB 1000 mg/200 mL premix  1,000 mg Intravenous 60 min Pre-Op Early, Arvilla Meres, MD       Labs: Basic Metabolic Panel: Recent Labs  Lab 07/17/17 1228  NA 142  K 3.7  GLUCOSE 89   CBC: Recent Labs  Lab 07/17/17 1228  HGB 9.9*  HCT 29.0*   CBG: Recent Labs  Lab 07/17/17 1435 07/17/17 1455 07/17/17 1617  GLUCAP 68 110* 107*   Assessment/Plan: 1.  New ESRD: S/p TDC placement today with respiratory distress event afterwards. Looks comfortable at this time, but did cough up a large amount of blood-tinged sputum, concerning for pulm edema, although could be traumatic in nature from temp intubation. If CXR +  for pulm edema, will proceed with HD tonight. If negative, will re-eval in AM and plan to start HD in less emergent manner.  2.  Hypertension/volume: BP low side today, lungs clear on exam. Awaiting CXR read. 3.  Anemia: Hgb 9.9, follow 4.  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 Will need to d/w primary nephrologist if ok to begin weaning once starting HD. Looks like lymphoproliferative process was ruled out. 5. Scleroderma 6. Type 2 DM  Veneta Penton, PA-C 07/17/2017, 4:42 PM  [facility] Pager: [phone]  Pt seen, examined and agree w A/P as above. New start to HD, pt w autoimmune disease and progressive CKD , now stage V.  Cough and dyspnea, sig vol overload on exam and CXR w IS edema. Had [facility] placed today for initiation of OP HD but pt too sick for dc at this time.  Plan HD tonight, UF 2- 2.5 L as tolerated. BP's are not very high.  Not on any BP meds at home, was taking procardia but she says it was for Raynauds not for HTN.  Will follow.  Kelly Splinter MD [employer] pager [phone]   07/17/2017, 5:14  PM

## 2017-07-17 NOTE — Anesthesia Procedure Notes (Signed)
Procedure Name: Intubation Date/Time: 07/17/2017 3:38 PM Performed by: Josejulian Tarango T, CRNA Pre-anesthesia Checklist: Patient identified, Emergency Drugs available, Suction available and Patient being monitored Patient Re-evaluated:Patient Re-evaluated prior to induction Oxygen Delivery Method: Circle system utilized Preoxygenation: Pre-oxygenation with 100% oxygen Induction Type: IV induction Ventilation: Mask ventilation without difficulty Laryngoscope Size: Mac and 3 Grade View: Grade II Tube type: Oral Tube size: 7.5 mm Number of attempts: 1 Airway Equipment and Method: Patient positioned with wedge pillow and Stylet Placement Confirmation: ETT inserted through vocal cords under direct vision,  positive ETCO2 and breath sounds checked- equal and bilateral Secured at: 22 cm Tube secured with: Tape Dental Injury: Teeth and Oropharynx as per pre-operative assessment and Bloody posterior oropharynx

## 2017-07-18 ENCOUNTER — Observation Stay (HOSPITAL_COMMUNITY): Payer: Medicaid Other

## 2017-07-18 DIAGNOSIS — N185 Chronic kidney disease, stage 5: Secondary | ICD-10-CM | POA: Diagnosis not present

## 2017-07-18 DIAGNOSIS — L89612 Pressure ulcer of right heel, stage 2: Secondary | ICD-10-CM | POA: Diagnosis present

## 2017-07-18 DIAGNOSIS — Z7952 Long term (current) use of systemic steroids: Secondary | ICD-10-CM | POA: Diagnosis not present

## 2017-07-18 DIAGNOSIS — E039 Hypothyroidism, unspecified: Secondary | ICD-10-CM | POA: Diagnosis present

## 2017-07-18 DIAGNOSIS — E876 Hypokalemia: Secondary | ICD-10-CM | POA: Diagnosis present

## 2017-07-18 DIAGNOSIS — N186 End stage renal disease: Secondary | ICD-10-CM | POA: Diagnosis present

## 2017-07-18 DIAGNOSIS — Z95828 Presence of other vascular implants and grafts: Secondary | ICD-10-CM | POA: Diagnosis not present

## 2017-07-18 DIAGNOSIS — E8770 Fluid overload, unspecified: Secondary | ICD-10-CM | POA: Diagnosis present

## 2017-07-18 DIAGNOSIS — J9621 Acute and chronic respiratory failure with hypoxia: Secondary | ICD-10-CM | POA: Diagnosis not present

## 2017-07-18 DIAGNOSIS — K219 Gastro-esophageal reflux disease without esophagitis: Secondary | ICD-10-CM | POA: Diagnosis present

## 2017-07-18 DIAGNOSIS — I7300 Raynaud's syndrome without gangrene: Secondary | ICD-10-CM | POA: Diagnosis present

## 2017-07-18 DIAGNOSIS — Z79899 Other long term (current) drug therapy: Secondary | ICD-10-CM | POA: Diagnosis not present

## 2017-07-18 DIAGNOSIS — R609 Edema, unspecified: Secondary | ICD-10-CM | POA: Diagnosis not present

## 2017-07-18 DIAGNOSIS — R0602 Shortness of breath: Secondary | ICD-10-CM | POA: Diagnosis present

## 2017-07-18 DIAGNOSIS — D631 Anemia in chronic kidney disease: Secondary | ICD-10-CM | POA: Diagnosis present

## 2017-07-18 DIAGNOSIS — G629 Polyneuropathy, unspecified: Secondary | ICD-10-CM | POA: Diagnosis present

## 2017-07-18 DIAGNOSIS — M349 Systemic sclerosis, unspecified: Secondary | ICD-10-CM | POA: Diagnosis present

## 2017-07-18 DIAGNOSIS — E1122 Type 2 diabetes mellitus with diabetic chronic kidney disease: Secondary | ICD-10-CM | POA: Diagnosis present

## 2017-07-18 DIAGNOSIS — N2581 Secondary hyperparathyroidism of renal origin: Secondary | ICD-10-CM | POA: Diagnosis present

## 2017-07-18 DIAGNOSIS — Z79890 Hormone replacement therapy (postmenopausal): Secondary | ICD-10-CM | POA: Diagnosis not present

## 2017-07-18 DIAGNOSIS — Z87891 Personal history of nicotine dependence: Secondary | ICD-10-CM | POA: Diagnosis not present

## 2017-07-18 DIAGNOSIS — I120 Hypertensive chronic kidney disease with stage 5 chronic kidney disease or end stage renal disease: Secondary | ICD-10-CM | POA: Diagnosis present

## 2017-07-18 DIAGNOSIS — J95821 Acute postprocedural respiratory failure: Secondary | ICD-10-CM | POA: Diagnosis present

## 2017-07-18 DIAGNOSIS — Z833 Family history of diabetes mellitus: Secondary | ICD-10-CM | POA: Diagnosis not present

## 2017-07-18 DIAGNOSIS — Z6838 Body mass index (BMI) 38.0-38.9, adult: Secondary | ICD-10-CM | POA: Diagnosis not present

## 2017-07-18 DIAGNOSIS — Z992 Dependence on renal dialysis: Secondary | ICD-10-CM | POA: Diagnosis not present

## 2017-07-18 DIAGNOSIS — R0603 Acute respiratory distress: Secondary | ICD-10-CM | POA: Diagnosis not present

## 2017-07-18 DIAGNOSIS — E119 Type 2 diabetes mellitus without complications: Secondary | ICD-10-CM | POA: Diagnosis not present

## 2017-07-18 LAB — RENAL FUNCTION PANEL
ANION GAP: 14 (ref 5–15)
Albumin: 2.8 g/dL — ABNORMAL LOW (ref 3.5–5.0)
BUN: 69 mg/dL — ABNORMAL HIGH (ref 6–20)
CHLORIDE: 103 mmol/L (ref 101–111)
CO2: 22 mmol/L (ref 22–32)
CREATININE: 8.04 mg/dL — AB (ref 0.44–1.00)
Calcium: 7.5 mg/dL — ABNORMAL LOW (ref 8.9–10.3)
GFR calc non Af Amer: 6 mL/min — ABNORMAL LOW (ref >60)
GFR, EST AFRICAN AMERICAN: 6 mL/min — AB (ref >60)
Glucose, Bld: 101 mg/dL — ABNORMAL HIGH (ref 65–99)
Phosphorus: 7.1 mg/dL — ABNORMAL HIGH (ref 2.5–4.6)
Potassium: 3.1 mmol/L — ABNORMAL LOW (ref 3.5–5.1)
Sodium: 139 mmol/L (ref 135–145)

## 2017-07-18 LAB — HEPATITIS B SURFACE ANTIGEN: Hepatitis B Surface Ag: NEGATIVE

## 2017-07-18 LAB — HEPATITIS B CORE ANTIBODY, TOTAL: HEP B C TOTAL AB: NEGATIVE

## 2017-07-18 LAB — HIV ANTIBODY (ROUTINE TESTING W REFLEX): HIV Screen 4th Generation wRfx: NONREACTIVE

## 2017-07-18 LAB — HEPATITIS B SURFACE ANTIBODY,QUALITATIVE: Hep B S Ab: NONREACTIVE

## 2017-07-18 MED ORDER — KETOROLAC TROMETHAMINE 15 MG/ML IJ SOLN
INTRAMUSCULAR | Status: AC
  Administered 2017-07-18: 15 mg via INTRAVENOUS
  Filled 2017-07-18: qty 1

## 2017-07-18 MED ORDER — ACETAMINOPHEN 325 MG PO TABS
ORAL_TABLET | ORAL | Status: AC
  Administered 2017-07-18: 650 mg via ORAL
  Filled 2017-07-18: qty 2

## 2017-07-18 MED ORDER — KETOROLAC TROMETHAMINE 15 MG/ML IJ SOLN
15.0000 mg | Freq: Once | INTRAMUSCULAR | Status: AC
  Administered 2017-07-18: 15 mg via INTRAVENOUS

## 2017-07-18 MED ORDER — HYDROCODONE-ACETAMINOPHEN 5-325 MG PO TABS
2.0000 | ORAL_TABLET | Freq: Once | ORAL | Status: AC
  Administered 2017-07-18: 2 via ORAL
  Filled 2017-07-18: qty 2

## 2017-07-18 NOTE — Op Note (Signed)
    OPERATIVE REPORT  DATE OF SURGERY: 07/18/2017  PATIENT: Rebecca Mullins, 42 y.o. female MRN: [id number]  DOB: Apr 10, 1976  PRE-OPERATIVE DIAGNOSIS: Stage renal disease  POST-OPERATIVE DIAGNOSIS:  Same  PROCEDURE: Right IJ hemodialysis catheter with SonoSite visualization  SURGEON:  Curt Jews, M.D.  PHYSICIAN ASSISTANT: Nurse  ANESTHESIA: General  EBL: Minimal ml  No intake/output data recorded.  BLOOD ADMINISTERED: None  DRAINS: None  SPECIMEN: None  COUNTS CORRECT:  YES  PLAN OF CARE: PACU with chest x-ray pending  PATIENT DISPOSITION:  PACU - hemodynamically stable  PROCEDURE DETAILS: Patient has progressive renal failure and is now end-stage renal disease requiring she was taken to the operating room placed supine position where the area of the right and left neck and chest were prepped and draped in usual sterile fashion.  SonoSite ultrasound was used to visualize the jugular veins which were adequate bilaterally.  Initial plan was for local with sedation.  The patient was very uncomfortable lying in mild Trendelenburg and began coughing quite excessively.  Concern was for airway and therefore the patient was intubated for the procedure.  Using SonoSite visualization of the right internal jugular vein with access at the base of the neck and a guidewire the dilator and peel-away sheath was passed over the guidewire and the dilator and guidewire removed.  A 23 cm catheter was positioned in the left level of the distal right atrium.  The peel-away sheath was removed.  The catheter was brought through a subcutaneous tunnel through a separate stab incision.  The 2 lm ports were attached and both lumens flushed and aspirated easily and were locked with 1000 unit/cc heparin.  The catheter was secured to the skin with a 3-0 nylon stitch and the entry site was closed with a 4-0 subcuticular Vicryl stitch.  The patient was extubated in the operating room and transferred to the  recovery room where she was assessed and plan for admission and dialysis.   Rosetta Posner, M.D., [facility] 07/18/2017 9:34 AM

## 2017-07-18 NOTE — Anesthesia Postprocedure Evaluation (Signed)
Anesthesia Post Note  Patient: Rebecca Mullins  Procedure(s) Performed: INSERTION OF Right Internal Jugular DIALYSIS  CATHETER (Right Neck)     Patient location during evaluation: PACU Anesthesia Type: General Level of consciousness: awake and alert Pain management: pain level controlled Vital Signs Assessment: post-procedure vital signs reviewed and stable Respiratory status: spontaneous breathing, nonlabored ventilation, respiratory function stable and patient connected to nasal cannula oxygen Cardiovascular status: blood pressure returned to baseline and stable Postop Assessment: no apparent nausea or vomiting Anesthetic complications: no    Last Vitals:  Vitals:   07/18/17 0246 07/18/17 0637  BP: 98/64 (!) 102/58  Pulse: 93 88  Resp: 18 19  Temp:  37.1 C  SpO2:  99%    Last Pain:  Vitals:   07/18/17 0637  TempSrc: Oral  PainSc:                  [name] [name]

## 2017-07-18 NOTE — Progress Notes (Addendum)
[facility] Progress Note  Subjective: feeling much better, had HD overnight, 2 L off  Vitals:   07/17/17 2153 07/18/17 0246 07/18/17 0637 07/18/17 0839  BP: 124/86 98/64 (!) 102/58 94/63  Pulse: (!) 102 93 88 98  Resp: 16 18 19 18   Temp: 97.6 F (36.4 C)  98.7 F (37.1 C) 98.2 F (36.8 C)  TempSrc: Oral  Oral Oral  SpO2: 95%  99% 92%  Weight: 96.3 kg (212 lb 4.9 oz)     Height: 5\' 2"  (1.575 m)       Inpatient medications: . famotidine  20 mg Oral QHS  . ferrous sulfate  325 mg Oral QODAY  . heparin  5,000 Units Subcutaneous Q8H  . levothyroxine  88 mcg Oral QAC breakfast  . mycophenolate  1,500 mg Oral Q12H  . neomycin-bacitracin-polymyxin   Topical BID  . pantoprazole  40 mg Oral Daily  . predniSONE  20 mg Oral Daily  . sodium bicarbonate  650 mg Oral TID  . sulfamethoxazole-trimethoprim  1 tablet Oral Q M,W,F  . [START ON 07/22/2017] Vitamin D (Ergocalciferol)  50,000 Units Oral Q7 days   . sodium chloride 10 mL/hr at 07/17/17 1240  . sodium chloride    . sodium chloride     sodium chloride, sodium chloride, acetaminophen **OR** acetaminophen, alteplase, heparin, lidocaine (PF), lidocaine-prilocaine, ondansetron, pentafluoroprop-tetrafluoroeth  Exam: Gen looks better today, more alert, less coughing No jvd Chest clear RRR abd soft ntnd no ascites Ext 2+ bilat LE edema R IJ TDC NF, Ox 3   Assessment: 1.  New ESRD: S/p [facility] 1/09, 1st HD last night, plan HD again today 2.  Hypertension/volume: pulm edema on CXR, better today clinically. Cont to lower vol w HD.  3.  Anemia of CKD: Hgb 9.9, follow 4.  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 Will need to d/w primary nephrologist if ok to begin weaning once starting HD. Looks like lymphoproliferative process was ruled out. 5. Scleroderma 6. Type 2 DM  ADDENDUM: CLIP process had been started as outpatient prior to admission. Has outpatient HD slot at [facility]. Will be MWF 11:45am chair time. On her first outpatient HD, she will need  to be there at 11am to sign paperwork. Also, I have consulted [facility]VS to see if she is a candidate for permanent access. Known severe scleroderma with "bad circulation"; would love to have vasc surgery weight in. -KS  Plan - as above   Kelly Splinter MD [place] pager [phone]   07/18/2017, 11:54 AM   Recent Labs  Lab 07/17/17 1228 07/17/17 2148 07/18/17 0755  NA 142  --  139  K 3.7  --  3.1*  CL  --   --  103  CO2  --   --  22  GLUCOSE 89  --  101*  BUN  --   --  69*  CREATININE  --  6.72* 8.04*  CALCIUM  --   --  7.5*  PHOS  --   --  7.1*   Recent Labs  Lab 07/17/17 1838 07/18/17 0755  ALT 32  --   ALBUMIN  --  2.8*   Recent Labs  Lab 07/17/17 1228 07/17/17 2148  WBC  --  13.3*  HGB 9.9* 9.4*  HCT 29.0* 29.7*  MCV  --  85.1  PLT  --  327   Iron/TIBC/Ferritin/ %Sat    Component Value Date/Time   IRON 35 07/05/2017  1405   TIBC 267 07/05/2017 1405   FERRITIN 189 07/05/2017 1405   IRONPCTSAT 13 07/05/2017 1405

## 2017-07-18 NOTE — Progress Notes (Addendum)
Progress Note    07/18/2017 1:44 PM 1 Day Post-Op  Subjective:  No pain from R IJ PermCath.  Patient states dialysis unit had no difficulty with catheter during treatment last night.  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  She denies signs or symptoms of uremia on exam.  She believes edema of BLE has improved since hospitalization.  She will have another HD treatment this morning.  She is R hand dominant.  Prior vein mapping demonstrates inadequate venous conduits in B upper extremities.  B UE arterial duplex demonstrates monophasic waveforms.   Vitals:   07/18/17 0637 07/18/17 0839  BP: (!) 102/58 94/63  Pulse: 88 98  Resp: 19 18  Temp: 98.7 F (37.1 C) 98.2 F (36.8 C)  SpO2: 99% 92%   Physical Exam: Cardiac:  RRR Lungs:  No increase in respiratory effort noted Incisions:  R IJ PermCath site without hematoma or continued bleeding Extremities:  Unable to palpate radial or ulnar pulses; symmetrical femoral pulses; unable to palpate pedal pulses; R heel shallow pressure sore without drainage or obvious infection Abdomen:  soft Neurologic: A&O  CBC    Component Value Date/Time   WBC 13.3 (H) 07/17/2017 2148   RBC 3.49 (L) 07/17/2017 2148   HGB 9.4 (L) 07/17/2017 2148   HCT 29.7 (L) 07/17/2017 2148   PLT 327 07/17/2017 2148   MCV 85.1 07/17/2017 2148   MCH 26.9 07/17/2017 2148   MCHC 31.6 07/17/2017 2148   RDW 15.7 (H) 07/17/2017 2148   LYMPHSABS 4.1 (H) 06/11/2017 1811   MONOABS 1.0 06/11/2017 1811   EOSABS 0.6 06/11/2017 1811   BASOSABS 0.1 06/11/2017 1811    BMET    Component Value Date/Time   NA 139 07/18/2017 0755   K 3.1 (L) 07/18/2017 0755   CL 103 07/18/2017 0755   CO2 22 07/18/2017 0755   GLUCOSE 101 (H) 07/18/2017 0755   BUN 69 (H) 07/18/2017 0755   CREATININE 8.04 (H) 07/18/2017 0755   CREATININE 0.87 01/19/2013 1619   CALCIUM  7.5 (L) 07/18/2017 0755   GFRNONAA 6 (L) 07/18/2017 0755   GFRNONAA 86 01/19/2013 1619   GFRAA 6 (L) 07/18/2017 0755   GFRAA >89 01/19/2013 1619    INR    Component Value Date/Time   INR 1.17 07/27/2015 1200     Intake/Output Summary (Last 24 hours) at 07/18/2017 1344 Last data filed at 07/18/2017 1914 Gross per 24 hour  Intake 620 ml  Output 1105 ml  Net -485 ml     Assessment/Plan:  42 y.o. female is s/p R IJ PermCath placement by Dr. Donnetta Hutching admitted with post operative respiratory distress and initiation of dialysis 1 Day Post-Op   -[facility] working well for HD treatment; patient to dialysis again this afternoon per Nephrology -Patient is not an ideal candidate for upper extremity AV fistula/graft due to autoimmune condition with poor circulation and history of gangrenous finger tips -As per Dr. Stephens Shire office note, CT angiogram will be needed to further access inflow for upper extremity vs thigh access placement -This case will be discussed with on call surgeon   Dagoberto Ligas, PA-C [facility] [phone] 07/18/2017 1:44 PM  Addendum  I have independently interviewed and examined the patient, and I agree with the physician assistant's findings.  Patient is not a candidate for any permanent access placement until status of perfusion of upper extremities and  legs settled given the history of autoamputation of digits.  See Dr. Stephens Shire consult on 07/15/17.  - Given pt's tenuous medical status, doubt she is a good candidate CT studies currently - Once medically stable pt can follow up with [place] for further work-up for access   Adele Barthel, MD, FACS [facility] [place] Office: [phone] Pager: [phone]  07/18/2017, 2:17 PM

## 2017-07-18 NOTE — Progress Notes (Signed)
PROGRESS NOTE  [name] PHX:[id number] DOB: 06/08/1976 DOA: 07/17/2017 PCP: Rebecca Drafts, FNP  HPI/Recap of past 24 hours: Rebecca Mullins is an 42 y.o. 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 Sherren Mullins Early on 07/17/17, and who developed acute respiratory distress requiring intubation for a brief period. Extubated on the same day. Emergent hemodialysis on 07/17/17 due to concern for fluid overload complicated by flash pulmonary edema. TRH asked to admit. Vascular surgery consulted for permanent access and recommended holding off until patient is more stable and has received several weeks of dialysis, possibly in the outpatient setting.  Patient seen and examined with no family members at her bedside. Reports b/l LE pain and swelling since yesterday 07/17/17. B/L LE duplex U/S ordered to r/o DVT. Still requiring supplemental O2.  Assessment/Plan: Principal Problem:   Acute respiratory distress Active Problems:   Obesity   Scleroderma (HCC)   ESRD (end stage renal disease) ([place])   Diet-controlled diabetes mellitus ([place])  Acute hypoxic respiratory failure requiring intubation 2/2 to fluid overload complicated by flash pulmonary edema in the setting of ESRD/ glomerulonephritis -extubated and started on dialysis same day 07/17/17 -Nephrology consulted and following -HD again this afternoon 07/18/17 to remove more fluid -Has HD spot at [place] MWF -vascular surgery consulted for permanent access-recommends holding off until more stable -Now on 2L O2 [place] -Keep continuous O2 monitoring -O2 supplement to maintain O2 sat 92% or greater  B/L LE tenderness and swelling -B/L LE duplex U/S to r/o DVT -No SCDs until DVTs ruled out  Scleroderma ([place]) -Continue immunosuppressive  therapy  Hypothyroidism -Levothyroxine  GERD -stable -omeprazole -ranitidine  Anemia of chronic disease/iron deficiency -stable 9.4 baseline 10 -ferrous sulfate -no overt bleeding -nephrology following -cbc am  Hypokalemia -K+ 3.1 -Nephrology following -BMP am   Diet-controlled diabetes mellitus ([place]) -Carbohydrate modified diet ordered.   HIV screening -HIV screen non reactive   Obesity -Body mass index is 38.78 kg/m.   Code Status: Full   Family Communication: no family members at bedside  Disposition Plan: will stay another midnight for additional dialysis sessions due to fluid overload and for additional workups for LE pain and swelling.   Consultants:  Nephrology  Vascular surgery  Procedures:  HD 07/17/17, 07/18/17  Antimicrobials:  None  DVT prophylaxis:  heparin sq 5000 u tid   Objective: Vitals:   07/18/17 1600 07/18/17 1630 07/18/17 1700 07/18/17 1730  BP: 125/71 117/65 105/67 (!) 111/59  Pulse: 98 90 (!) 107 (!) 106  Resp:      Temp:      TempSrc:      SpO2:      Weight:      Height:        Intake/Output Summary (Last 24 hours) at 07/18/2017 1759 Last data filed at 07/18/2017 0165 Gross per 24 hour  Intake 120 ml  Output 1080 ml  Net -960 ml   Filed Weights   07/17/17 1217 07/17/17 2153 07/18/17 1545  Weight: 96.2 kg (212 lb) 96.3 kg (212 lb 4.9 oz) 93.4 kg (205 lb 14.6 oz)    Exam:   General:  42 yo AAF obese NAD A&O x 3  Cardiovascular: RRR no rubs or gallops   Respiratory: Crackles at bases. No wheezes   Abdomen: soft NT ND NBS x4   Musculoskeletal: non focal. Hyperpigmentation LE b/l. Chronic venous stasis LE  b/l. Tenderness on palpation of calves.  Skin: as stated above  Psychiatry: Mood appropriate for condition and setting    Data Reviewed: CBC: Recent Labs  Lab 07/17/17 1228 07/17/17 2148  WBC  --  13.3*  HGB 9.9* 9.4*  HCT 29.0* 29.7*  MCV  --  85.1  PLT  --  937   Basic Metabolic  Panel: Recent Labs  Lab 07/17/17 1228 07/17/17 2148 07/18/17 0755  NA 142  --  139  K 3.7  --  3.1*  CL  --   --  103  CO2  --   --  22  GLUCOSE 89  --  101*  BUN  --   --  69*  CREATININE  --  6.72* 8.04*  CALCIUM  --   --  7.5*  PHOS  --   --  7.1*   GFR: Estimated Creatinine Clearance: 9.8 mL/min (A) (by C-G formula based on SCr of 8.04 mg/dL (H)). Liver Function Tests: Recent Labs  Lab 07/17/17 1838 07/18/17 0755  ALT 32  --   ALBUMIN  --  2.8*   No results for input(s): LIPASE, AMYLASE in the last 168 hours. No results for input(s): AMMONIA in the last 168 hours. Coagulation Profile: No results for input(s): INR, PROTIME in the last 168 hours. Cardiac Enzymes: No results for input(s): CKTOTAL, CKMB, CKMBINDEX, TROPONINI in the last 168 hours. BNP (last 3 results) No results for input(s): PROBNP in the last 8760 hours. HbA1C: No results for input(s): HGBA1C in the last 72 hours. CBG: Recent Labs  Lab 07/17/17 1435 07/17/17 1455 07/17/17 1617 07/17/17 2151  GLUCAP 68 110* 107* 76   Lipid Profile: No results for input(s): CHOL, HDL, LDLCALC, TRIG, CHOLHDL, LDLDIRECT in the last 72 hours. Thyroid Function Tests: No results for input(s): TSH, T4TOTAL, FREET4, T3FREE, THYROIDAB in the last 72 hours. Anemia Panel: No results for input(s): VITAMINB12, FOLATE, FERRITIN, TIBC, IRON, RETICCTPCT in the last 72 hours. Urine analysis:    Component Value Date/Time   COLORURINE STRAW (A) 07/17/2017 1303   APPEARANCEUR CLEAR 07/17/2017 1303   LABSPEC 1.011 07/17/2017 1303   PHURINE 5.0 07/17/2017 1303   GLUCOSEU NEGATIVE 07/17/2017 1303   HGBUR MODERATE (A) 07/17/2017 1303   BILIRUBINUR NEGATIVE 07/17/2017 1303   BILIRUBINUR negative 04/21/2015 1823   BILIRUBINUR neg 03/20/2015 1421   KETONESUR NEGATIVE 07/17/2017 1303   PROTEINUR 100 (A) 07/17/2017 1303   UROBILINOGEN 0.2 04/21/2015 1823   UROBILINOGEN 0.2 01/13/2015 1800   NITRITE NEGATIVE 07/17/2017 1303    LEUKOCYTESUR NEGATIVE 07/17/2017 1303   Sepsis Labs: @LABRCNTIP (procalcitonin:4,lacticidven:4)  )No results found for this or any previous visit (from the past 240 hour(s)).    Studies: No results found.  Scheduled Meds: . famotidine  20 mg Oral QHS  . ferrous sulfate  325 mg Oral QODAY  . heparin  5,000 Units Subcutaneous Q8H  . levothyroxine  88 mcg Oral QAC breakfast  . mycophenolate  1,500 mg Oral Q12H  . neomycin-bacitracin-polymyxin   Topical BID  . pantoprazole  40 mg Oral Daily  . predniSONE  20 mg Oral Daily  . sulfamethoxazole-trimethoprim  1 tablet Oral Q M,W,F  . [START ON 07/22/2017] Vitamin D (Ergocalciferol)  50,000 Units Oral Q7 days    Continuous Infusions: . sodium chloride 10 mL/hr at 07/17/17 1240  . sodium chloride    . sodium chloride       LOS: 0 days     Kayleen Memos, MD [facility] Pager  [phone]  If 7PM-7AM, please contact night-coverage [email or web address] Password [email or web address] 07/18/2017, 5:59 PM

## 2017-07-19 ENCOUNTER — Inpatient Hospital Stay (HOSPITAL_COMMUNITY): Payer: Medicaid Other

## 2017-07-19 ENCOUNTER — Encounter (HOSPITAL_COMMUNITY): Payer: Self-pay | Admitting: Vascular Surgery

## 2017-07-19 DIAGNOSIS — L8990 Pressure ulcer of unspecified site, unspecified stage: Secondary | ICD-10-CM

## 2017-07-19 DIAGNOSIS — R609 Edema, unspecified: Secondary | ICD-10-CM

## 2017-07-19 LAB — BASIC METABOLIC PANEL
Anion gap: 14 (ref 5–15)
BUN: 38 mg/dL — ABNORMAL HIGH (ref 6–20)
CHLORIDE: 98 mmol/L — AB (ref 101–111)
CO2: 25 mmol/L (ref 22–32)
Calcium: 8 mg/dL — ABNORMAL LOW (ref 8.9–10.3)
Creatinine, Ser: 6.23 mg/dL — ABNORMAL HIGH (ref 0.44–1.00)
GFR calc non Af Amer: 8 mL/min — ABNORMAL LOW (ref >60)
GFR, EST AFRICAN AMERICAN: 9 mL/min — AB (ref >60)
Glucose, Bld: 123 mg/dL — ABNORMAL HIGH (ref 65–99)
POTASSIUM: 3.4 mmol/L — AB (ref 3.5–5.1)
SODIUM: 137 mmol/L (ref 135–145)

## 2017-07-19 LAB — CBC
HEMATOCRIT: 28.1 % — AB (ref 36.0–46.0)
Hemoglobin: 8.4 g/dL — ABNORMAL LOW (ref 12.0–15.0)
MCH: 25.7 pg — ABNORMAL LOW (ref 26.0–34.0)
MCHC: 29.9 g/dL — AB (ref 30.0–36.0)
MCV: 85.9 fL (ref 78.0–100.0)
Platelets: 301 10*3/uL (ref 150–400)
RBC: 3.27 MIL/uL — ABNORMAL LOW (ref 3.87–5.11)
RDW: 15.6 % — ABNORMAL HIGH (ref 11.5–15.5)
WBC: 7.4 10*3/uL (ref 4.0–10.5)

## 2017-07-19 MED ORDER — BACITRACIN-NEOMYCIN-POLYMYXIN OINTMENT TUBE
TOPICAL_OINTMENT | Freq: Every day | CUTANEOUS | Status: DC
  Administered 2017-07-20 – 2017-07-21 (×2): via TOPICAL
  Filled 2017-07-19: qty 14.17

## 2017-07-19 MED ORDER — KIDNEY FAILURE BOOK
Freq: Once | Status: DC
  Filled 2017-07-19: qty 1

## 2017-07-19 NOTE — Care Management Note (Signed)
Case Management Note  Patient Details  Name: Rebecca Mullins MRN: [id number] Date of Birth: 10-18-1975  Subjective/Objective:                 Independent patent from home admitted with Acute respiratory distress in the setting of end-stage renal disease in the setting of glomerulonephritis. Clipped to [facility] MWF.      Action/Plan:  Anticipate DC to home when medically stable.   Expected Discharge Date:  07/17/17               Expected Discharge Plan:  Home/Self Care  In-House Referral:     Discharge planning Services  CM Consult  Post Acute Care Choice:    Choice offered to:     DME Arranged:    DME Agency:     HH Arranged:    HH Agency:     Status of Service:  In process, will continue to follow  If discussed at Long Length of Stay Meetings, dates discussed:    Additional Comments:  Carles Collet, RN 07/19/2017, 2:57 PM

## 2017-07-19 NOTE — Consult Note (Signed)
[place] Nurse wound consult note Reason for Consult: Consult requested for right heel wound.  Pt states this is chronic and she is followed by the [facility], and the wound has serial sharp debridements performed. They have ordered Neosporin daily.   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  Pt states she will follow-up with the outpatient wound care center after discharge.  Please re-consult if further assistance is needed.  Thank-you,  Julien Girt MSN, [place], [place], [place], [place]

## 2017-07-19 NOTE — Progress Notes (Signed)
Subjective:  Feels better after  Hd txs , sitting up on bedsied with out Oxygen "just got back from vas lab ("neg  For dvt  But +Baker cyst "0  Objective Vital signs in last 24 hours: Vitals:   07/18/17 1830 07/18/17 1857 07/18/17 2150 07/19/17 0542  BP: (!) 82/47 105/60 101/60 95/63  Pulse: 92 93 (!) 103 80  Resp:  18 18 18   Temp:  97.9 F (36.6 C) 98.5 F (36.9 C) 98.7 F (37.1 C)  TempSrc:  Oral Oral Oral  SpO2:  99% 100% 100%  Weight:  92 kg (202 lb 13.2 oz) 92 kg (202 lb 13.2 oz)   Height:       Weight change: -2.763 kg (-1.5 oz)  Physical Exam: General: AAF alert NAD OX4 Heart: RRR no rub, m, g Lungs: CTA non labored breathing  Abdomen: Obese, soft Nt, ND Extremities: bilat 2+ pedal edema Dialysis Access: R IJ Perm cath    Problem/Plan:  1.  New ESRD:S/p [facility] 1/09,  HD x2 since admit , last night, WANTS BREAK today  No dyspnea /next hd in am then MWF( OUT Pt HD = [facility] MWF  1145am) VVS  Noted "As per Dr. Stephens Shire office note, CT angiogram will be needed to further access inflow for upper extremity vs thigh access placement" due to Ov eval and not an ideal candidate for upper extremity AV fistula/graft due to autoimmune condition with poor circulation and history of gangrenous finger tips'   As dw Dr Jonnie Finner would like VVS eval before dc ,will consult  VVS for their recommendations regarding permanent access (possible CT angiogram as noted/ recommended prior) 2. Hypertension/volume:pulm edema on CXR, better today clinically. 4.2 kg down from admit wt.Cont to lower vol w HD. No bp meds needed/ asymp for am bp 95/63 repeat CXR today after HD x 2 3. Anemia of CKD:Hgb 9.9>N8.4  Start eas / last Fe study  12/28 tfs 13% Ferritin 189  On po iron ,change to IV iron on weekly HD  4. MBD- check PTH   corec ca 8.9, phos 7.1 start Auryxia 210 1 tid meals   5. Low VIT D = on weekly replacement po Ergocalciferol   6. 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 Will need to d/w primary nephrologist if ok to begin weaning once starting HD. Looks like lymphoproliferative process was ruled out. 7. Scleroderma 8. Type 2 DM  Ernest Haber, PA-C [facility] [phone] 07/19/2017,10:00 AM  LOS: 1 day   Pt seen, examined, agree w assess/plan as above with additions as indicated.  Kelly Splinter MD [facility] pager [phone]    cell [phone] 07/19/2017, 1:40 PM     Labs: Basic Metabolic Panel: Recent Labs  Lab 07/17/17 1228 07/17/17 2148 07/18/17 0755 07/19/17 0421  NA 142  --  139 137  K 3.7  --  3.1* 3.4*  CL  --   --  103 98*  CO2  --   --  22 25  GLUCOSE 89  --  101* 123*  BUN  --   --  69* 38*  CREATININE  --  6.72* 8.04* 6.23*  CALCIUM  --   --  7.5* 8.0*  PHOS  --   --  7.1*  --    Liver Function Tests: Recent Labs  Lab 07/17/17 1838 07/18/17 0755  ALT 32  --   ALBUMIN  --  2.8*   No results for input(s):  LIPASE, AMYLASE in the last 168 hours. No results for input(s): AMMONIA in the last 168 hours. CBC: Recent Labs  Lab 07/17/17 1228 07/17/17 2148 07/19/17 0421  WBC  --  13.3* 7.4  HGB 9.9* 9.4* 8.4*  HCT 29.0* 29.7* 28.1*  MCV  --  85.1 85.9  PLT  --  327 301   Cardiac Enzymes: No results for input(s): CKTOTAL, CKMB, CKMBINDEX, TROPONINI in the last 168 hours. CBG: Recent Labs  Lab 07/17/17 1435 07/17/17 1455 07/17/17 1617 07/17/17 2151  GLUCAP 68 110* 107* 76    Studies/Results: Dg Chest Port 1 View  Result Date: 07/17/2017 CLINICAL DATA:  42 year old female with history of dialysis catheter insertion. Evaluate for fluid overload. EXAM: PORTABLE CHEST 1 VIEW COMPARISON:  Chest x-ray 04/21/2015. FINDINGS: New right internal jugular PermCath with tip terminating at the superior cavoatrial junction. Lung volumes are low. There is cephalization of the pulmonary vasculature and slight indistinctness of the interstitial markings suggestive of mild pulmonary edema. Mild  cardiomegaly. Upper mediastinal contours are within normal limits. No pneumothorax. IMPRESSION: 1. Mild interstitial pulmonary edema. 2. New right internal jugular PermCath with tip terminating at the superior cavoatrial junction. No pneumothorax or other acute complicating features. Electronically Signed   By: Vinnie Langton M.D.   On: 07/17/2017 17:27   Dg Fluoro Guide Cv Line-no Report  Result Date: 07/17/2017 Fluoroscopy was utilized by the requesting physician.  No radiographic interpretation.   Medications: . sodium chloride    . sodium chloride     . famotidine  20 mg Oral QHS  . ferrous sulfate  325 mg Oral QODAY  . heparin  5,000 Units Subcutaneous Q8H  . kidney failure book   Does not apply Once  . levothyroxine  88 mcg Oral QAC breakfast  . mycophenolate  1,500 mg Oral Q12H  . neomycin-bacitracin-polymyxin   Topical BID  . pantoprazole  40 mg Oral Daily  . predniSONE  20 mg Oral Daily  . sulfamethoxazole-trimethoprim  1 tablet Oral Q M,W,F  . [START ON 07/22/2017] Vitamin D (Ergocalciferol)  50,000 Units Oral Q7 days

## 2017-07-19 NOTE — Progress Notes (Signed)
*  PRELIMINARY RESULTS* Vascular Ultrasound Bilateral lower extremity venous duplex has been completed.  Preliminary findings: No evidence of deep vein thrombosis in the visualized veins of the lower extremities. Somewhat limited evaluation of calf veins due to patient body habitus and tissue attenuation.  Negative for baker's cysts bilaterally.   Rebecca Mullins 07/19/2017, 10:01 AM

## 2017-07-19 NOTE — Progress Notes (Signed)
PROGRESS NOTE  Rebecca Mullins BWG:[id number] DOB: 1976/02/18 DOA: 07/17/2017 PCP: [name], FNP  HPI/Recap of past 24 hours: Rebecca Mullins is an 42 y.o. 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 Sherren Mocha Early on 07/17/17, and who developed acute respiratory distress requiring intubation for a brief period. Extubated on the same day. Emergent hemodialysis on 07/17/17 (first HD session) due to concern for fluid overload complicated by flash pulmonary edema. TRH asked to admit. Vascular surgery consulted for permanent access and recommended holding off until patient is more stable and has received several weeks of dialysis, possibly in the outpatient setting.  Patient seen and examined with no family members at her bedside. b/l LE pain is improving. Duplex U/S LE negative bilaterally. HD cancelled today. States she feels too weak. Will have HD tomorrow then resume her planned schedule MWF.   Assessment/Plan: Principal Problem:   Acute respiratory distress Active Problems:   Obesity   Scleroderma (HCC)   ESRD (end stage renal disease) (HCC)   Diet-controlled diabetes mellitus (HCC)   Volume overload   Pressure injury of skin  Acute hypoxic respiratory failure requiring intubation 2/2 to fluid overload complicated by flash pulmonary edema in the setting of ESRD/ glomerulonephritis -extubated and started on dialysis same day 07/17/17 -O2 sat is good on ambient air after HD -Nephrology consulted and following -HD again tomorrow 07/20/17 to remove more fluid then start MWF -Has HD spot at [facility] MWF -vascular surgery consulted for permanent access-recommends holding off until more stable  New ESRD on HD MWF -started HD sessions 07/17/17 -Has HD spot as stated above -HD tomorrow 07/20/17 then will start planned HD schedule  MWF  Chronic right heel wound, poa -wound care specialist consulted -followed by [facility] -continue neosporin -resume outpatient wound care follow up after discharge  B/L LE tenderness and swelling -B/L LE duplex U/S negative DVT -No SCDs until DVTs ruled out  Generalized weakness -most likely multifactorial 2/2 to new HD vs others -encourage po intake -cancelled HD due to weakness -close monitoring -PT as tolerated  Scleroderma (HCC) -Continue immunosuppressive therapy  Hypothyroidism -Levothyroxine  GERD -stable -omeprazole -ranitidine  Anemia of chronic disease/iron deficiency -stable 9.4 baseline 10 -ferrous sulfate -no overt bleeding -nephrology following -cbc am  Hypokalemia, improving -K+ 3.4 from 3.1 -Nephrology following -BMP am   Diet-controlled diabetes mellitus ([place]) -Carbohydrate modified diet ordered.   HIV screening -HIV screen non reactive   Obesity -Body mass index is 38.78 kg/m.   Code Status: Full   Family Communication: no family members at bedside  Disposition Plan: will stay another midnight for additional dialysis session tomorrow due to fluid overload.   Consultants:  Nephrology  Vascular surgery  Procedures:  HD 07/17/17, 07/18/17  Antimicrobials:  None  DVT prophylaxis:  heparin sq 5000 u tid   Objective: Vitals:   07/18/17 1830 07/18/17 1857 07/18/17 2150 07/19/17 0542  BP: (!) 82/47 105/60 101/60 95/63  Pulse: 92 93 (!) 103 80  Resp:  18 18 18   Temp:  97.9 F (36.6 C) 98.5 F (36.9 C) 98.7 F (37.1 C)  TempSrc:  Oral Oral Oral  SpO2:  99% 100% 100%  Weight:  92 kg (202 lb 13.2 oz) 92 kg (202 lb 13.2 oz)   Height:        Intake/Output Summary (Last 24 hours) at 07/19/2017 1347 Last  data filed at 07/19/2017 0845 Gross per 24 hour  Intake 480 ml  Output 1611 ml  Net -1131 ml   Filed Weights   07/18/17 1545 07/18/17 1857 07/18/17 2150  Weight: 93.4 kg (205 lb 14.6 oz) 92 kg (202  lb 13.2 oz) 92 kg (202 lb 13.2 oz)    Exam:   General:  42 yo AAF obese NAD A&O x 3  Cardiovascular: RRR no rubs or gallops   Respiratory: Crackles at bases. No wheezes   Abdomen: soft NT ND NBS x4   Musculoskeletal: non focal. Hyperpigmentation LE b/l. Chronic venous stasis LE b/l. Tenderness on palpation of calves.  Skin: as stated above  Psychiatry: Mood appropriate for condition and setting    Data Reviewed: CBC: Recent Labs  Lab 07/17/17 1228 07/17/17 2148 07/19/17 0421  WBC  --  13.3* 7.4  HGB 9.9* 9.4* 8.4*  HCT 29.0* 29.7* 28.1*  MCV  --  85.1 85.9  PLT  --  327 644   Basic Metabolic Panel: Recent Labs  Lab 07/17/17 1228 07/17/17 2148 07/18/17 0755 07/19/17 0421  NA 142  --  139 137  K 3.7  --  3.1* 3.4*  CL  --   --  103 98*  CO2  --   --  22 25  GLUCOSE 89  --  101* 123*  BUN  --   --  69* 38*  CREATININE  --  6.72* 8.04* 6.23*  CALCIUM  --   --  7.5* 8.0*  PHOS  --   --  7.1*  --    GFR: Estimated Creatinine Clearance: 12.6 mL/min (A) (by C-G formula based on SCr of 6.23 mg/dL (H)). Liver Function Tests: Recent Labs  Lab 07/17/17 1838 07/18/17 0755  ALT 32  --   ALBUMIN  --  2.8*   No results for input(s): LIPASE, AMYLASE in the last 168 hours. No results for input(s): AMMONIA in the last 168 hours. Coagulation Profile: No results for input(s): INR, PROTIME in the last 168 hours. Cardiac Enzymes: No results for input(s): CKTOTAL, CKMB, CKMBINDEX, TROPONINI in the last 168 hours. BNP (last 3 results) No results for input(s): PROBNP in the last 8760 hours. HbA1C: No results for input(s): HGBA1C in the last 72 hours. CBG: Recent Labs  Lab 07/17/17 1435 07/17/17 1455 07/17/17 1617 07/17/17 2151  GLUCAP 68 110* 107* 76   Lipid Profile: No results for input(s): CHOL, HDL, LDLCALC, TRIG, CHOLHDL, LDLDIRECT in the last 72 hours. Thyroid Function Tests: No results for input(s): TSH, T4TOTAL, FREET4, T3FREE, THYROIDAB in the last 72  hours. Anemia Panel: No results for input(s): VITAMINB12, FOLATE, FERRITIN, TIBC, IRON, RETICCTPCT in the last 72 hours. Urine analysis:    Component Value Date/Time   COLORURINE STRAW (A) 07/17/2017 1303   APPEARANCEUR CLEAR 07/17/2017 1303   LABSPEC 1.011 07/17/2017 1303   PHURINE 5.0 07/17/2017 1303   GLUCOSEU NEGATIVE 07/17/2017 1303   HGBUR MODERATE (A) 07/17/2017 1303   BILIRUBINUR NEGATIVE 07/17/2017 1303   BILIRUBINUR negative 04/21/2015 1823   BILIRUBINUR neg 03/20/2015 1421   KETONESUR NEGATIVE 07/17/2017 1303   PROTEINUR 100 (A) 07/17/2017 1303   UROBILINOGEN 0.2 04/21/2015 1823   UROBILINOGEN 0.2 01/13/2015 1800   NITRITE NEGATIVE 07/17/2017 1303   LEUKOCYTESUR NEGATIVE 07/17/2017 1303   Sepsis Labs: @LABRCNTIP (procalcitonin:4,lacticidven:4)  )No results found for this or any previous visit (from the past 240 hour(s)).    Studies: No results found.  Scheduled Meds: . famotidine  20 mg Oral  QHS  . ferrous sulfate  325 mg Oral QODAY  . heparin  5,000 Units Subcutaneous Q8H  . kidney failure book   Does not apply Once  . levothyroxine  88 mcg Oral QAC breakfast  . mycophenolate  1,500 mg Oral Q12H  . [START ON 07/20/2017] neomycin-bacitracin-polymyxin   Topical Daily  . pantoprazole  40 mg Oral Daily  . predniSONE  20 mg Oral Daily  . sulfamethoxazole-trimethoprim  1 tablet Oral Q M,W,F  . [START ON 07/22/2017] Vitamin D (Ergocalciferol)  50,000 Units Oral Q7 days    Continuous Infusions: . sodium chloride    . sodium chloride       LOS: 1 day     Kayleen Memos, MD [facility] Pager [phone]  If 7PM-7AM, please contact night-coverage [email or web address] Password [email or web address] 07/19/2017, 1:47 PM

## 2017-07-20 LAB — PARATHYROID HORMONE, INTACT (NO CA): PTH: 735 pg/mL — AB (ref 15–65)

## 2017-07-20 MED ORDER — NA FERRIC GLUC CPLX IN SUCROSE 12.5 MG/ML IV SOLN: 62.5000 mg | INTRAVENOUS | Status: DC

## 2017-07-20 MED ORDER — DOXERCALCIFEROL 4 MCG/2ML IV SOLN: 2.0000 ug | INTRAVENOUS | Status: DC

## 2017-07-20 MED ORDER — DARBEPOETIN ALFA 40 MCG/0.4ML IJ SOSY
40.0000 ug | PREFILLED_SYRINGE | INTRAMUSCULAR | Status: DC
  Administered 2017-07-21: 40 ug via INTRAVENOUS
  Filled 2017-07-20: qty 0.4

## 2017-07-20 MED ORDER — GABAPENTIN 600 MG PO TABS
300.0000 mg | ORAL_TABLET | Freq: Every day | ORAL | Status: DC
  Administered 2017-07-20 – 2017-07-21 (×2): 300 mg via ORAL
  Filled 2017-07-20 (×2): qty 1

## 2017-07-20 NOTE — Progress Notes (Signed)
CSW consulted with Dr. Nevada Crane concerning pt's transportation to dialysis on Monday.  Dr. Nevada Crane will possibly discharge pt on Monday.  No further needs are noted at this time.  CSW signing off.   Reed Breech LCSWA [phone]

## 2017-07-20 NOTE — Progress Notes (Signed)
Subjective:  No cos  Ready for dc home  After hd today / noted only access will be Permcath  As Dr Marval Regal  Had discussed in past with [facility] "not a candidate for any permanent access placement given ho of autoamputation of digits "/ with poor status of perfusion of upper extremities and legs Per Dr. Stephens Shire consult on 07/15/17.  Objective Vital signs in last 24 hours: Vitals:   07/19/17 1736 07/19/17 2039 07/20/17 0442 07/20/17 1040  BP: 108/73 102/75 126/65 100/73  Pulse: 95 95 90 85  Resp: 18 18 18 18   Temp: 98.6 F (37 C) 99 F (37.2 C) 98.4 F (36.9 C) 98 F (36.7 C)  TempSrc: Oral Oral Oral Oral  SpO2: 97% 98% 100% 98%  Weight:  92 kg (202 lb 13.2 oz)    Height:       Weight change: -1.4 kg (-1.4 oz)  Physical Exam: General: AAF alert NAD OX4 Heart: RRR no rub, m, g Lungs: CTA non labored breathing  Abdomen: Obese, soft Nt, ND Extremities: bilat 2+ pedal edema Dialysis Access: R IJ Perm cath    Problem/Plan: 1.  New ESRD:S/p TDC1/09,  HD x2 since admit ,   No dyspnea  / HD today then ok for dc home  Per [facility]  then MWF( OUT Pt HD = [facility] MWF  1145am) only access will be perm cath as noted above . 2. Hypertension/volume/ pulm edema: vol overload resolved clinically, 4 kg down from admit wt.Cont to lower vol w HD.No bp meds needed/  3. Anemiaof CKD:Hgb 9.9>8.4  Start Aranesp 36mcg today  / last Fe study  12/28 tfs 13% Ferritin 189  On po iron ,change to IV iron on weekly HD  4. 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 This is followed by specialists at tertiary care center. No change in meds , she will need to f/u w/ specialists after dc.   7. Scleroderma 8. Type 2 DM  Ernest Haber, PA-C [facility] Beeper [phone] 07/20/2017,11:54 AM  LOS: 2 days    Pt seen, examined and agree w A/P as above.  Kelly Splinter MD [facility] pager [phone]   07/20/2017, 4:02 PM    Labs: Basic Metabolic Panel: Recent Labs  Lab 07/17/17 1228 07/17/17 2148 07/18/17 0755 07/19/17 0421  NA 142  --  139 137  K 3.7  --  3.1* 3.4*  CL  --   --  103 98*  CO2  --   --  22 25  GLUCOSE 89  --  101* 123*  BUN  --   --  69* 38*  CREATININE  --  6.72* 8.04* 6.23*  CALCIUM  --   --  7.5* 8.0*  PHOS  --   --  7.1*  --    Liver Function Tests: Recent Labs  Lab 07/17/17 1838 07/18/17 0755  ALT 32  --   ALBUMIN  --  2.8*   No results for input(s): LIPASE, AMYLASE in the last 168 hours. No results for input(s): AMMONIA in the last 168 hours. CBC: Recent Labs  Lab 07/17/17 1228 07/17/17 2148 07/19/17 0421  WBC  --  13.3* 7.4  HGB 9.9* 9.4* 8.4*  HCT 29.0* 29.7*  28.1*  MCV  --  85.1 85.9  PLT  --  327 301   Cardiac Enzymes: No results for input(s): CKTOTAL, CKMB, CKMBINDEX, TROPONINI in the last 168 hours. CBG: Recent Labs  Lab 07/17/17 1435 07/17/17 1455 07/17/17 1617 07/17/17 2151  GLUCAP 68 110* 107* 76    Studies/Results: No results found. Medications: . sodium chloride    . sodium chloride     . famotidine  20 mg Oral QHS  . ferrous sulfate  325 mg Oral QODAY  . heparin  5,000 Units Subcutaneous Q8H  . kidney failure book   Does not apply Once  . levothyroxine  88 mcg Oral QAC breakfast  . mycophenolate  1,500 mg Oral Q12H  . neomycin-bacitracin-polymyxin   Topical Daily  . pantoprazole  40 mg Oral Daily  . predniSONE  20 mg Oral Daily  . sulfamethoxazole-trimethoprim  1 tablet Oral Q M,W,F  . [START ON 07/22/2017] Vitamin D (Ergocalciferol)  50,000 Units Oral Q7 days

## 2017-07-20 NOTE — Progress Notes (Signed)
PROGRESS NOTE  AARALYN KIL YIR:[id number] DOB: 1976/05/25 DOA: 07/17/2017 PCP: [name], FNP  HPI/Recap of past 24 hours: Rebecca Mullins is an 42 y.o. 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 Sherren Mocha Early on 07/17/17, and who developed acute respiratory distress requiring intubation for a brief period. Extubated on the same day. Emergent hemodialysis on 07/17/17 (first HD session) due to concern for fluid overload complicated by flash pulmonary edema. TRH asked to admit. Vascular surgery consulted for permanent access and recommended holding off until patient is more stable and has received several weeks of dialysis, possibly in the outpatient setting.  Patient seen and examined with no family members at her bedside. Reports b/l LE pain that is burning and throbbing intermittently. Trial of gabapentin started.   Patient reports no arrangement for transportation to HD on Monday, concern that she may not be able to get there and back due to inclement bad weather. Will stay and continue dialysis. Possible discharge on Monday if weather permits.    Assessment/Plan: Principal Problem:   Acute respiratory distress Active Problems:   Obesity   Scleroderma (HCC)   ESRD (end stage renal disease) (HCC)   Diet-controlled diabetes mellitus (HCC)   Volume overload   Pressure injury of skin  Acute hypoxic respiratory failure requiring intubation 2/2 to fluid overload complicated by flash pulmonary edema in the setting of ESRD/ glomerulonephritis -extubated and started on dialysis same day 07/17/17 -O2 sat is good on ambient air after HD -Nephrology consulted and following -HD again tomorrow 07/20/17 to remove more fluid then start MWF -Has HD spot at [place] MWF -vascular surgery consulted for permanent access-recommends holding  off until more stable  New ESRD on HD MWF -started HD sessions 07/17/17 -Has HD spot as stated above -HD planned 07/20/17 then will start planned HD schedule MWF  Chronic right heel wound, poa -wound care specialist consulted -followed by [facility] -continue neosporin -resume outpatient wound care follow up after discharge  B/L LE tenderness and swelling -B/L LE duplex U/S negative DVT -No SCDs until DVTs ruled out  Generalized weakness -most likely multifactorial 2/2 to new HD vs others -encourage po intake -cancelled HD due to weakness -close monitoring -PT as tolerated  Scleroderma (HCC) -Continue immunosuppressive therapy  Hypothyroidism -Levothyroxine  GERD -stable -omeprazole -ranitidine  Anemia of chronic disease/iron deficiency -stable 9.4 baseline 10 -ferrous sulfate -no overt bleeding -nephrology following -cbc am  Hypokalemia, improving -K+ 3.4 from 3.1 -Nephrology following -BMP am   Diet-controlled diabetes mellitus ([place]) -Carbohydrate modified diet ordered.   HIV screening -HIV screen non reactive   Obesity -Body mass index is 38.78 kg/m.   Code Status: Full   Family Communication: no family members at bedside  Disposition Plan: will stay another midnight for additional dialysis session tomorrow due to fluid overload.   Consultants:  Nephrology  Vascular surgery  Procedures:  HD 07/17/17, 07/18/17  Antimicrobials:  None  DVT prophylaxis:  heparin sq 5000 u tid   Objective: Vitals:   07/19/17 2039 07/20/17 0442 07/20/17 1040 07/20/17 1726  BP: 102/75 126/65 100/73 98/73  Pulse: 95 90 85 (!) 103  Resp: 18 18 18 18   Temp: 99 F (37.2 C) 98.4 F (36.9 C) 98 F (36.7 C) 98.4 F (36.9 C)  TempSrc: Oral Oral Oral Oral  SpO2: 98% 100% 98% 95%  Weight: 92 kg (  202 lb 13.2 oz)     Height:        Intake/Output Summary (Last 24 hours) at 07/20/2017 1759 Last data filed at 07/20/2017 1300 Gross per 24 hour    Intake 720 ml  Output 0 ml  Net 720 ml   Filed Weights   07/18/17 1857 07/18/17 2150 07/19/17 2039  Weight: 92 kg (202 lb 13.2 oz) 92 kg (202 lb 13.2 oz) 92 kg (202 lb 13.2 oz)    Exam:   General:  42 yo AAF obese NAD A&O x 3  Cardiovascular: RRR no rubs or gallops   Respiratory: Crackles at bases. No wheezes   Abdomen: soft NT ND NBS x4   Musculoskeletal: non focal. Hyperpigmentation LE b/l. Chronic venous stasis LE b/l. Tenderness on palpation of calves.  Skin: as stated above  Psychiatry: Mood appropriate for condition and setting    Data Reviewed: CBC: Recent Labs  Lab 07/17/17 1228 07/17/17 2148 07/19/17 0421  WBC  --  13.3* 7.4  HGB 9.9* 9.4* 8.4*  HCT 29.0* 29.7* 28.1*  MCV  --  85.1 85.9  PLT  --  327 268   Basic Metabolic Panel: Recent Labs  Lab 07/17/17 1228 07/17/17 2148 07/18/17 0755 07/19/17 0421  NA 142  --  139 137  K 3.7  --  3.1* 3.4*  CL  --   --  103 98*  CO2  --   --  22 25  GLUCOSE 89  --  101* 123*  BUN  --   --  69* 38*  CREATININE  --  6.72* 8.04* 6.23*  CALCIUM  --   --  7.5* 8.0*  PHOS  --   --  7.1*  --    GFR: Estimated Creatinine Clearance: 12.6 mL/min (A) (by C-G formula based on SCr of 6.23 mg/dL (H)). Liver Function Tests: Recent Labs  Lab 07/17/17 1838 07/18/17 0755  ALT 32  --   ALBUMIN  --  2.8*   No results for input(s): LIPASE, AMYLASE in the last 168 hours. No results for input(s): AMMONIA in the last 168 hours. Coagulation Profile: No results for input(s): INR, PROTIME in the last 168 hours. Cardiac Enzymes: No results for input(s): CKTOTAL, CKMB, CKMBINDEX, TROPONINI in the last 168 hours. BNP (last 3 results) No results for input(s): PROBNP in the last 8760 hours. HbA1C: No results for input(s): HGBA1C in the last 72 hours. CBG: Recent Labs  Lab 07/17/17 1435 07/17/17 1455 07/17/17 1617 07/17/17 2151  GLUCAP 68 110* 107* 76   Lipid Profile: No results for input(s): CHOL, HDL, LDLCALC,  TRIG, CHOLHDL, LDLDIRECT in the last 72 hours. Thyroid Function Tests: No results for input(s): TSH, T4TOTAL, FREET4, T3FREE, THYROIDAB in the last 72 hours. Anemia Panel: No results for input(s): VITAMINB12, FOLATE, FERRITIN, TIBC, IRON, RETICCTPCT in the last 72 hours. Urine analysis:    Component Value Date/Time   COLORURINE STRAW (A) 07/17/2017 1303   APPEARANCEUR CLEAR 07/17/2017 1303   LABSPEC 1.011 07/17/2017 1303   PHURINE 5.0 07/17/2017 1303   GLUCOSEU NEGATIVE 07/17/2017 1303   HGBUR MODERATE (A) 07/17/2017 1303   BILIRUBINUR NEGATIVE 07/17/2017 1303   BILIRUBINUR negative 04/21/2015 1823   BILIRUBINUR neg 03/20/2015 1421   KETONESUR NEGATIVE 07/17/2017 1303   PROTEINUR 100 (A) 07/17/2017 1303   UROBILINOGEN 0.2 04/21/2015 1823   UROBILINOGEN 0.2 01/13/2015 1800   NITRITE NEGATIVE 07/17/2017 1303   LEUKOCYTESUR NEGATIVE 07/17/2017 1303   Sepsis Labs: @LABRCNTIP (procalcitonin:4,lacticidven:4)  )No results found for  this or any previous visit (from the past 240 hour(s)).    Studies: No results found.  Scheduled Meds: . darbepoetin (ARANESP) injection - DIALYSIS  40 mcg Intravenous Q Sat-HD  . doxercalciferol  2 mcg Intravenous Q M,W,F-HD  . famotidine  20 mg Oral QHS  . gabapentin  300 mg Oral Daily  . heparin  5,000 Units Subcutaneous Q8H  . kidney failure book   Does not apply Once  . levothyroxine  88 mcg Oral QAC breakfast  . mycophenolate  1,500 mg Oral Q12H  . neomycin-bacitracin-polymyxin   Topical Daily  . pantoprazole  40 mg Oral Daily  . predniSONE  20 mg Oral Daily  . sulfamethoxazole-trimethoprim  1 tablet Oral Q M,W,F  . [START ON 07/22/2017] Vitamin D (Ergocalciferol)  50,000 Units Oral Q7 days    Continuous Infusions: . sodium chloride    . sodium chloride    . [START ON 07/24/2017] ferric gluconate (FERRLECIT/NULECIT) IV       LOS: 2 days     Rebecca Memos, MD [facility] Pager [phone]  If 7PM-7AM, please contact  night-coverage [email or web address] Password [facility] 07/20/2017, 5:59 PM

## 2017-07-21 LAB — CBC
HEMATOCRIT: 29.3 % — AB (ref 36.0–46.0)
HEMOGLOBIN: 9 g/dL — AB (ref 12.0–15.0)
MCH: 26.3 pg (ref 26.0–34.0)
MCHC: 30.7 g/dL (ref 30.0–36.0)
MCV: 85.7 fL (ref 78.0–100.0)
Platelets: 321 10*3/uL (ref 150–400)
RBC: 3.42 MIL/uL — AB (ref 3.87–5.11)
RDW: 15.3 % (ref 11.5–15.5)
WBC: 11.5 10*3/uL — ABNORMAL HIGH (ref 4.0–10.5)

## 2017-07-21 LAB — RENAL FUNCTION PANEL
ALBUMIN: 2.9 g/dL — AB (ref 3.5–5.0)
ANION GAP: 17 — AB (ref 5–15)
BUN: 81 mg/dL — ABNORMAL HIGH (ref 6–20)
CALCIUM: 7.6 mg/dL — AB (ref 8.9–10.3)
CO2: 19 mmol/L — ABNORMAL LOW (ref 22–32)
Chloride: 98 mmol/L — ABNORMAL LOW (ref 101–111)
Creatinine, Ser: 8.97 mg/dL — ABNORMAL HIGH (ref 0.44–1.00)
GFR, EST AFRICAN AMERICAN: 6 mL/min — AB (ref >60)
GFR, EST NON AFRICAN AMERICAN: 5 mL/min — AB (ref >60)
GLUCOSE: 154 mg/dL — AB (ref 65–99)
PHOSPHORUS: 7.2 mg/dL — AB (ref 2.5–4.6)
POTASSIUM: 4.3 mmol/L (ref 3.5–5.1)
SODIUM: 134 mmol/L — AB (ref 135–145)

## 2017-07-21 MED ORDER — DARBEPOETIN ALFA 40 MCG/0.4ML IJ SOSY
PREFILLED_SYRINGE | INTRAMUSCULAR | Status: AC
  Administered 2017-07-21: 40 ug via INTRAVENOUS
  Filled 2017-07-21: qty 0.4

## 2017-07-21 MED ORDER — SULFAMETHOXAZOLE-TRIMETHOPRIM 800-160 MG PO TABS: 1.0000 | ORAL_TABLET | ORAL | 0 refills | Status: DC

## 2017-07-21 MED ORDER — GABAPENTIN 600 MG PO TABS: 300.0000 mg | ORAL_TABLET | Freq: Every day | ORAL | 0 refills | Status: AC

## 2017-07-21 MED ORDER — FERRIC CITRATE 1 GM 210 MG(FE) PO TABS
420.0000 mg | ORAL_TABLET | Freq: Three times a day (TID) | ORAL | Status: DC
  Administered 2017-07-21 (×2): 420 mg via ORAL
  Filled 2017-07-21 (×2): qty 2

## 2017-07-21 MED ORDER — RENA-VITE PO TABS: 1.0000 | ORAL_TABLET | Freq: Every day | ORAL | Status: DC

## 2017-07-21 NOTE — Progress Notes (Signed)
Subjective:  Had HD late last night( secondary  To emergent cases) , no cos ,has OP schedule MWF [facility] /  States she can call ride for dc today and in am able to call cab then transportation [place] transportation Wed   Objective Vital signs in last 24 hours: Vitals:   07/21/17 0530 07/21/17 0600 07/21/17 0630 07/21/17 0700  BP: (!) 155/83 128/82 (!) 148/91 128/76  Pulse: 77 94 82 90  Resp: 20 17 20    Temp:      TempSrc:      SpO2: 96% 96% 94% 96%  Weight:      Height:       Weight change: 0.4 kg (14.1 oz)  Physical Exam: General: AAF alert NAD OX4 Heart: RRR no rub, m, g Lungs: CTA non labored breathing  Abdomen: Obese, soft Nt, ND Extremities: bilat 2+ pedal edema Dialysis Access: R IJ Perm cath    Problem/Plan:  1.  New ESRD:S/p TDC1/09,  HD x3 since admit , last night,  No dyspnea /next hd in am then MWF( OUT Pt HD = [facility] MWF  1145am)  2. HD Acces;noted only access will be Permcath  As Dr Marval Regal  Had discussed in past with [facility] "not a candidate for any permanent access placement given ho of autoamputation of digits "/ with poor status of perfusion of upper extremities and legs Per Dr. Stephens Shire consult on 07/15/17. 3. Hypertension/volume:pulm edema on CXR, no further sob with UF on HD  , slowly lowering edw .today clinically. wt down 4.kg down from admit wt..No bp meds needed/ 4. Anemiaof CKD:Hgb 9.9>8.4>9.0   Started aranesp 40 mcg given yest . (was on as op at short stay )   / last Fe study  12/28 tfs 13% Ferritin 189 (per pt  Received iv iron at short stay) On po iron ,change to IV iron on weekly HD  5. MBD- PTH 783 (start Hec on HD)  corec ca 8.4, phos 7.2 start Auryxia  tid meals   6. 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 Will need to d/w primary nephrologist (at [facility]) if ok to begin weaning once starting HD. Looks like lymphoproliferative process  was ruled out. 8. Scleroderma- sees Rheumatology at [facility] as Op  9. Type 2 DM -Diet control   Ernest Haber, PA-C [facility] [phone] 07/21/2017,8:43 AM  LOS: 3 days   Pt seen, examined and agree w A/P as above.  Kelly Splinter MD [facility] pager [phone]   07/21/2017, 3:09 PM    Labs: Basic Metabolic Panel: Recent Labs  Lab 07/18/17 0755 07/19/17 0421 07/21/17 0500  NA 139 137 134*  K 3.1* 3.4* 4.3  CL 103 98* 98*  CO2 22 25 19*  GLUCOSE 101* 123* 154*  BUN 69* 38* 81*  CREATININE 8.04* 6.23* 8.97*  CALCIUM 7.5* 8.0* 7.6*  PHOS 7.1*  --  7.2*   Liver Function Tests: Recent Labs  Lab 07/17/17 1838 07/18/17 0755 07/21/17 0500  ALT 32  --   --   ALBUMIN  --  2.8* 2.9*   CBC: Recent Labs  Lab 07/17/17 2148 07/19/17 0421 07/21/17 0500  WBC 13.3* 7.4 11.5*  HGB 9.4* 8.4* 9.0*  HCT 29.7* 28.1* 29.3*  MCV 85.1 85.9 85.7  PLT 327 301 321    CBG: Recent Labs  Lab 07/17/17  1435 07/17/17 1455 07/17/17 1617 07/17/17 2151  GLUCAP 68 110* 107* 76     Medications: . sodium chloride    . sodium chloride    . [START ON 07/24/2017] ferric gluconate (FERRLECIT/NULECIT) IV     . darbepoetin (ARANESP) injection - DIALYSIS  40 mcg Intravenous Q Sat-HD  . doxercalciferol  2 mcg Intravenous Q M,W,F-HD  . famotidine  20 mg Oral QHS  . gabapentin  300 mg Oral Daily  . heparin  5,000 Units Subcutaneous Q8H  . kidney failure book   Does not apply Once  . levothyroxine  88 mcg Oral QAC breakfast  . mycophenolate  1,500 mg Oral Q12H  . neomycin-bacitracin-polymyxin   Topical Daily  . pantoprazole  40 mg Oral Daily  . predniSONE  20 mg Oral Daily  . sulfamethoxazole-trimethoprim  1 tablet Oral Q M,W,F  . [START ON 07/22/2017] Vitamin D (Ergocalciferol)  50,000 Units Oral Q7 days

## 2017-07-21 NOTE — Discharge Summary (Signed)
Discharge Summary  Rebecca Mullins JSE:[id number] DOB: May 09, 1976  PCP: Rebecca Drafts, FNP  Admit date: 07/17/2017 Discharge date: 07/21/2017  Time spent: 25 minutes  Recommendations for Outpatient Follow-up:  1. Keep HD sessions MWF starting Monday 07/22/17 2. Follow up with nephrology after discharge 3. Follow up with your PCP post discharge 4. Take your medications as prescribed  Discharge Diagnoses:  Active Hospital Problems   Diagnosis Date Noted  . Acute respiratory distress 07/17/2017  . Pressure injury of skin 07/19/2017  . Volume overload 07/18/2017  . ESRD (end stage renal disease) ([place]) 07/17/2017  . Diet-controlled diabetes mellitus ([place]) 07/17/2017  . Scleroderma ([place]) 10/11/2015  . Obesity 06/29/2015    Resolved Hospital Problems   Diagnosis Date Noted Date Resolved  . 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 Boykin Reaper 07/17/17, and who developed acute respiratory distress requiring intubation for a brief period. Extubated on the same day. Emergent hemodialysis on 07/17/17 (first HD session) due to concern for fluid overload complicated by flash pulmonary edema. [facility] asked to admit. Vascular surgery consulted for permanent access and recommended holding off until patient is more stable and has received several weeks of dialysis, possibly in the outpatient setting.  Hospital course complicated by polyneuropathy affecting lower extremities bilaterally. Started on po gabapentin with  good response. Wound care was continued with improvement. Continue follow up with wound care in the outpatient setting.  Last dialysis was early this morning 07/21/17 due to emergent cases yesterday.  On the day of discharge the patient was hemodynamically stable. Her lower extremity pain had resolved with gabapentin. O2 saturation above 95% on RA. Oral intake without any difficulty. Will need to follow up with nephrology and keep appointments for dialysis sessions.   Hospital Course:  Principal Problem:   Acute respiratory distress Active Problems:   Obesity   Scleroderma ([place])   ESRD (end stage renal disease) ([place])   Diet-controlled diabetes mellitus ([facility])   Volume overload   Pressure injury of skin   Procedures:  HD 07/17/17, 07/18/17, night of 1/12-1/13  Consultations:  nephrology  Discharge Exam: BP 120/69 (BP Location: Right Arm)   Pulse 88   Temp 98.6 F (37 C) (Oral)   Resp 20   Ht 5\' 2"  (1.575 m)   Wt 91.9 kg (202 lb 9.6 oz)   SpO2 98%   BMI 37.06 kg/m    General:  42 yo AAF obese, pleasant. NAD A&O x 3  Cardiovascular: RRR no rubs or gallops   Respiratory: CTA No wheezes or rales  Abdomen: soft NT ND NBS x4   Musculoskeletal: non focal. Hyperpigmentation LE b/l. Chronic venous stasis LE b/l.  Skin: as stated above; pressure ulcer Right heel stage 2  Psychiatry: Mood appropriate for condition and setting     Discharge Instructions You were cared for by a hospitalist during your hospital stay. If you have any questions about your discharge medications or the care you received while you were in the hospital after you are discharged, you can call the  unit and asked to speak with the hospitalist on call if the hospitalist that took care of you is not available. Once you are discharged, your primary care physician will handle any further medical issues. Please note that NO REFILLS for any discharge medications will be authorized once you are discharged, as it  is imperative that you return to your primary care physician (or establish a relationship with a primary care physician if you do not have one) for your aftercare needs so that they can reassess your need for medications and monitor your lab values.   Allergies as of 07/21/2017      Reactions   Penicillins Anaphylaxis   Has patient had a PCN reaction causing immediate rash, facial/tongue/throat swelling, SOB or lightheadedness with hypotension: Yes Has patient had a PCN reaction causing severe rash involving mucus membranes or skin necrosis: No Has patient had a PCN reaction that required hospitalization No Has patient had a PCN reaction occurring within the last 10 years: No If all of the above answers are "NO", then may proceed with Cephalosporin use.   Nsaids    Kidney issues   Oxycodone    Patient says unable to take this medicine.      Medication List    TAKE these medications   ferrous sulfate 325 (65 FE) MG EC tablet Take 325 mg by mouth every other day.   gabapentin 600 MG tablet Commonly known as:  NEURONTIN Take 0.5 tablets (300 mg total) by mouth daily.   levothyroxine 88 MCG tablet Commonly known as:  SYNTHROID, LEVOTHROID Take 88 mcg by mouth daily.   medroxyPROGESTERone 150 MG/ML injection Commonly known as:  DEPO-PROVERA INJECT 1 ML (150 MG TOTAL) INTO THE MUSCLE EVERY 3 MONTHS   mycophenolate 500 MG tablet Commonly known as:  CELLCEPT Take 1,500 mg by mouth every 12 (twelve) hours.   NIFEdipine 30 MG 24 hr tablet Commonly known as:  PROCARDIA-XL/ADALAT-CC/NIFEDICAL-XL Take 1 tablet (30 mg total) by mouth daily.   omeprazole 40 MG capsule Commonly known as:  PRILOSEC Take 40 mg by mouth 2 (two) times daily.   predniSONE 10 MG tablet Commonly known as:  DELTASONE Take 20 mg by mouth daily.   ranitidine 150 MG capsule Commonly known as:  ZANTAC Take 150 mg by mouth at bedtime as needed for heartburn.   sodium bicarbonate 325 MG tablet Take 650 mg by  mouth 3 (three) times daily.   sulfamethoxazole-trimethoprim 800-160 MG tablet Commonly known as:  BACTRIM DS,SEPTRA DS Take 1 tablet by mouth 3 (three) times a week. M W F What changed:    when to take this  additional instructions   Vitamin D (Ergocalciferol) 50000 units Caps capsule Commonly known as:  DRISDOL Take 50,000 Units by mouth every 7 (seven) days.      Allergies  Allergen Reactions  . Penicillins Anaphylaxis    Has patient had a PCN reaction causing immediate rash, facial/tongue/throat swelling, SOB or lightheadedness with hypotension: Yes Has patient had a PCN reaction causing severe rash involving mucus membranes or skin necrosis: No Has patient had a PCN reaction that required hospitalization No Has patient had a PCN reaction occurring within the last 10 years: No If all of the above answers are "NO", then may proceed with Cephalosporin use.   . Nsaids     Kidney issues  . Oxycodone     Patient says unable to take this medicine.   Follow-up Information    Rebecca Drafts, FNP Follow up.  Specialty:  Nurse Practitioner Contact information: [place] [address] [phone]            The results of significant diagnostics from this hospitalization (including imaging, microbiology, ancillary and laboratory) are listed below for reference.    Significant Diagnostic Studies: Dg Chest Port 1 View  Result Date: 07/17/2017 CLINICAL DATA:  42 year old female with history of dialysis catheter insertion. Evaluate for fluid overload. EXAM: PORTABLE CHEST 1 VIEW COMPARISON:  Chest x-ray 04/21/2015. FINDINGS: New right internal jugular PermCath with tip terminating at the superior cavoatrial junction. Lung volumes are low. There is cephalization of the pulmonary vasculature and slight indistinctness of the interstitial markings suggestive of mild pulmonary edema. Mild cardiomegaly. Upper mediastinal contours are within normal  limits. No pneumothorax. IMPRESSION: 1. Mild interstitial pulmonary edema. 2. New right internal jugular PermCath with tip terminating at the superior cavoatrial junction. No pneumothorax or other acute complicating features. Electronically Signed   By: Vinnie Langton M.D.   On: 07/17/2017 17:27   Dg Fluoro Guide Cv Line-no Report  Result Date: 07/17/2017 Fluoroscopy was utilized by the requesting physician.  No radiographic interpretation.    Microbiology: No results found for this or any previous visit (from the past 240 hour(s)).   Labs: Basic Metabolic Panel: Recent Labs  Lab 07/17/17 1228 07/17/17 2148 07/18/17 0755 07/19/17 0421 07/21/17 0500  NA 142  --  139 137 134*  K 3.7  --  3.1* 3.4* 4.3  CL  --   --  103 98* 98*  CO2  --   --  22 25 19*  GLUCOSE 89  --  101* 123* 154*  BUN  --   --  69* 38* 81*  CREATININE  --  6.72* 8.04* 6.23* 8.97*  CALCIUM  --   --  7.5* 8.0* 7.6*  PHOS  --   --  7.1*  --  7.2*   Liver Function Tests: Recent Labs  Lab 07/17/17 1838 07/18/17 0755 07/21/17 0500  ALT 32  --   --   ALBUMIN  --  2.8* 2.9*   No results for input(s): LIPASE, AMYLASE in the last 168 hours. No results for input(s): AMMONIA in the last 168 hours. CBC: Recent Labs  Lab 07/17/17 1228 07/17/17 2148 07/19/17 0421 07/21/17 0500  WBC  --  13.3* 7.4 11.5*  HGB 9.9* 9.4* 8.4* 9.0*  HCT 29.0* 29.7* 28.1* 29.3*  MCV  --  85.1 85.9 85.7  PLT  --  327 301 321   Cardiac Enzymes: No results for input(s): CKTOTAL, CKMB, CKMBINDEX, TROPONINI in the last 168 hours. BNP: BNP (last 3 results) No results for input(s): BNP in the last 8760 hours.  ProBNP (last 3 results) No results for input(s): PROBNP in the last 8760 hours.  CBG: Recent Labs  Lab 07/17/17 1435 07/17/17 1455 07/17/17 1617 07/17/17 2151  GLUCAP 68 110* 107* 76       Signed:  Kayleen Memos, MD [facility] 07/21/2017, 2:20 PM

## 2017-07-21 NOTE — Discharge Instructions (Addendum)
Hemodialysis Dialysis is a procedure that replaces some of the work that healthy kidneys do. During dialysis, wastes, salt, and extra water are removed from the blood, and the level of certain important minerals in the blood (such as potassium) is maintained. It is typically started when you have lost about 85-90% of your kidney function, or earlier if it may help relieve your symptoms. Hemodialysis is a type of dialysis in which a machine called a dialyzer is used to filter the blood. Hemodialysis is usually performed by a health care provider at a hospital or dialysis center 3 times a week for 3-5 hours during each visit. It may also be performed on a different schedule at home with training. Tell a health care provider about:  Any allergies you have.  All medicines you are taking, including vitamins, herbs, eye drops, creams, and over-the-counter medicines.  Any blood disorders you have.  Any medical conditions you have. What are the risks? Generally, this is a safe procedure. However, problems may occur, including:  Low blood pressure (hypotension).  Narrowing or ballooning of a blood vessel, or bleeding at the site where blood is removed from the body and returned to the body (vascular access).  An infection or blockage at the vascular access site.  Allergic reaction to chemicals used to sterilize the dialyzer.  What happens before the procedure? Before having hemodialysis for the first time, you will need surgery or a procedure to create a vascular access site. There are three types of vascular access:  Arteriovenous fistula. This type of access is created when an artery and a vein (usually in the arm) are connected by a surgical procedure. The arteriovenous fistula usually takes 1-6 months to develop after surgery.  Arteriovenous graft. This type of access is created when a tube is used to connect an artery and a vein in the arm. An arteriovenous graft can usually be used  within 2-3 weeks of surgery.  A venous catheter. To create this type of access, a thin, flexible tube (catheter) is placed in a large vein in your neck, chest, or groin. A venous catheter can be used right away.  What happens during the procedure?  Your weight, blood pressure, pulse, and temperature will be measured.  The skin around your vascular access will be cleaned (sterilized) to get rid of germs.  Your vascular access will be connected to the dialyzer. ? If your access is a fistula or graft, two needles will be inserted through the vascular access. The needles will be connected to a plastic tube that is connected to the dialyzer. They will be taped to your skin so that they do not move out of place. ? If your access is a catheter, it will be connected to a plastic tube that is then connected to the dialyzer.  The dialysis machine will be turned on. This will cause your blood to flow to the dialyzer. The dialyzer will filter and clean your blood before returning it to your body. While the dialysis machine is working, your blood pressure and pulse will be checked several times.  Once dialysis is complete, your vascular access will be disconnected from the dialyzer. If your access is a fistula or graft, the needles will be removed and a bandage (dressing) will be placed over the access. Hemodialysis is performed while you are sitting or reclining. During the procedure, you may sleep, read, watch television, or perform any other tasks that can be done while you are in this  position. If you experience side effects or any other discomfort during the procedure, let your health care provider know. He or she may be able to make adjustments to help you feel more comfortable. The procedure may vary among health care providers and hospitals. What happens after the procedure?  Your weight will be measured.  Your blood may be tested to see whether your treatments are removing enough wastes. This is  usually done once a month.  You may experience or continue to experience side effects, including: ? Dizziness. ? Muscle cramps. ? Nausea. ? Headaches. ? Allergic reaction to the chemicals used to sterilize the dialyzer. Summary  Dialysis is a procedure that replaces some of the work that healthy kidneys do. During dialysis, wastes, salt, and extra water are removed from the blood, and the level of certain important minerals in the blood is maintained.  Hemodialysis is a type of dialysis in which a machine called a dialyzer is used to filter the blood. It is usually performed by a health care provider at a hospital or dialysis center 3 times a week for 3-5 hours during each visit.  Before having hemodialysis for the first time, you will need surgery or a procedure to create a vascular access.  During hemodialysis, your vascular access will be connected to the dialyzer. The dialyzer will filter and clean your blood before returning it to your body. This information is not intended to replace advice given to you by your health care provider. Make sure you discuss any questions you have with your health care provider. Document Released: 10/20/2012 Document Revised: 07/31/2016 Document Reviewed: 07/31/2016 Elsevier Interactive Patient Education  2018 [employer].  Vascular Access for Hemodialysis A vascular access is a connection between two blood vessels that allows blood to be easily removed from the body and returned to the body during hemodialysis. Hemodialysis is a procedure in which a machine outside of the body filters the blood. There are three types of vascular accesses:  Arteriovenous fistula. This is a connection between an artery and a vein (usually in the arm) that is made by sewing them together. Blood in the artery flows directly into the vein, causing it to get larger over time. This makes it easier for the vein to be used for hemodialysis. An arteriovenous fistula takes 1-6  months to develop after surgery.  Arteriovenous graft. This is a connection between an artery and a vein in the arm that is made with a tube. An arteriovenous graft can be used within 2-3 weeks of surgery.  Venous catheter. This is a thin, flexible tube that is placed in a large vein (usually in the neck, chest, or groin). A venous catheter for hemodialysis contains two tubes that come out of the skin. A venous catheter can be used right away. It is usually used as a temporary access if you need hemodialysis before a fistula or graft has developed. It may also be used as a permanent access if a fistula or graft cannot be created.  Which type of access is best for me? The type of access that is best for you depends on the size and strength of your veins. A fistula is usually the preferred type of access. It can last several years and is less likely than the other types of accesses to become infected or to cause blood clots within a blood vessel (thrombosis). However, a fistula is not an option for everyone. If your veins are not the right size, a graft  may be used instead. Grafts require you to have strong veins. If your veins are not strong enough for a graft, a catheter may be used. Catheters are more likely than fistulas and grafts to become infected or to have thrombosis. Sometimes, only one type of access is an option. Your health care provider will help you determine which type of access is best for you. How is a vascular access used? The way the access is used depends on the type of access:  If the access is a fistula or graft, two needles are inserted through the skin into the access before each hemodialysis session. Blood leaves the body through one of the needles and travels through a tube to the hemodialysis machine (dialyzer). It then flows through another tube and returns to the body through the second needle.  If the access is a catheter, one tube is connected directly to the tube that  leads to the dialyzer and the other is connected to a tube that leads away from the dialyzer. Blood leaves the body through one tube and returns to the body through the other.  What kind of problems can occur with vascular accesses?  Blood clots within a blood vessel (thrombosis). Thrombosis can lead to a narrowing of a blood vessel or tube (stenosis). If thrombosis occurs frequently, another access site may be created as a backup.  Infection. These problems are most likely to occur with a venous catheter and least likely to occur with an arteriovenous fistula. How do I care for my vascular access? Wear a medical alert bracelet. This tells health care providers that you are a dialysis patient in the case of an emergency and allows them to care for your veins appropriately. If you have a graft or fistula:  A "bruit" is a noise that is heard with a stethoscope and a "thrill" is a vibration felt over the graft or fistula. The presence of the bruit and thrill indicates that the access is working. You will be taught to feel for the thrill each day. If this is not felt, the access may be clotted. Call your health care provider.  You may use the arm where your vascular access is located freely after the site heals. Keep the following in mind: ? Avoid pressure on the arm. ? Avoid lifting heavy objects with the arm. ? Avoid sleeping on the arm. ? Avoid wearing tight-sleeved shirts or jewelry around the graft or fistula.  Do not allow blood pressure monitoring or needle punctures on the side where the graft or fistula is located.  With permission from your health care provider, you may do exercises to help with blood flow through a fistula. These exercises involve squeezing a rubber ball or other soft objects as instructed.  Contact a health care provider if:  Chills develop.  You have an oral temperature above 102 F (38.9 C).  Swelling around the graft or fistula gets worse.  New pain  develops.  Pus or other fluid (drainage) is seen at the vascular access site.  Skin redness or red streaking is seen on the skin around, above, or below the vascular access. Get help right away if:  Pain, numbness, or an unusual pale skin color develops in the hand on the side of your fistula.  Dizziness or weakness develops that you have not had before.  The vascular access has bleeding that cannot be easily controlled. This information is not intended to replace advice given to you by your health care  provider. Make sure you discuss any questions you have with your health care provider. Document Released: 09/15/2002 Document Revised: 12/01/2015 Document Reviewed: 11/11/2012 Elsevier Interactive Patient Education  2017 [place]. Dialysis Diet Dialysis is a treatment that cleans your blood. It is used when the kidneys are damaged. When you need dialysis, you should watch your diet. This is because some nutrients can build up in your blood between treatments and make you sick. These nutrients are:  Potassium.  Phosphorus.  Sodium.  Your doctor or dietitian will:  Tell you how much of these you can have.  Tell you if you need to look out for other nutrients too.  Help you plan meals.  Tell you how much to drink each day.  What do I need to know about this diet?  Limit potassium. Potassium is in milk, fruits, and vegetables.  Limit phosphorus. Phosphorus is in milk, cheese, beans, nuts, and carbonated beverages.  Limit salt (sodium). Foods that have a lot sodium include processed and cured meats, ready-made frozen meals, canned vegetables, and salty snack foods.  Do not use salt substitutes.  Try not to eat whole-grain foods and foods that have a lot of fiber.  Follow your doctor's instructions about how much to drink. You may be told to: ? Write down what you drink. ? Write down foods you eat that are made mostly from water, such as gelatin and soups. ? Drink from  small cups.  Ask your doctor if you should take a medicine that binds phosphorus.  Take vitamin and mineral supplements only as told by your doctor.  Eat meat, poultry, fish, and eggs. Limit nuts and beans.  Before you cook potatoes, cut them into small pieces. Then boil them in unsalted water.  Drain all fluid from cooked vegetables and canned fruits before you eat them. What foods can I eat? Grains White bread. White rice. Cooked cereal. Unsalted popcorn. Tortillas. Pasta. Vegetables Fresh or frozen broccoli, carrots, and green beans. Cabbage. Cauliflower. Celery. Cucumbers. Eggplant. Radishes. Zucchini. Fruits Apples. Fresh or frozen berries. Fresh or canned pears, peaches, and pineapple. Grapes. Plums. Meats and Other Protein Sources Fresh or frozen beef, pork, chicken, and fish. Eggs. Low-sodium canned tuna or salmon. Dairy Cream cheese. Heavy cream. Ricotta cheese. Beverages Apple cider. Cranberry juice. Grape juice. Lemonade. Black coffee. Condiments Herbs. Spices. Jam and jelly. Honey. Sweets and Desserts Sherbet. Cakes. Cookies. Fats and Oils Olive oil, canola oil, and safflower oil. Other Non-dairy creamer. Non-dairy whipped topping. Homemade broth without salt. The items listed above may not be a complete list of recommended foods or beverages. Contact your dietitian for more options. What foods are not recommended? Grains Whole-grain bread. Whole-grain pasta. High-fiber cereal. Vegetables Potatoes. Beets. Tomatoes. Winter squash and pumpkin. Asparagus. Spinach. Parsnips. Fruits Star fruit. Bananas. Oranges. Kiwi. Nectarines. Prunes. Melon. Dried fruit. Avocado. Meats and Other Protein Sources Canned, smoked, and cured meats. Soil scientist. Sardines. Nuts and seeds. Peanut butter. Beans and legumes. Dairy Milk. Buttermilk. Yogurt. Cheese and cottage cheese. Processed cheese spreads. Beverages Orange juice. Prune juice. Carbonated soft  drinks. Condiments Salt. Salt substitutes. Soy sauce. Sweets and Desserts Ice cream. Chocolate. Candied nuts. Fats and Oils Butter. Margarine. Other Ready-made frozen meals. Canned soups. The items listed above may not be a complete list of foods and beverages to avoid. Contact your dietitian for more information. This information is not intended to replace advice given to you by your health care provider. Make sure you discuss any questions you have  with your health care provider. Document Released: 12/25/2011 Document Revised: 12/01/2015 Document Reviewed: 01/26/2014 Elsevier Interactive Patient Education  [employer].

## 2017-07-21 NOTE — Progress Notes (Signed)
HD tx initiated via HD cath w/o problem, pull/push/flush equally w/o problem, VSS, will cont to monitor while on HD tx until I hand off to dayshift HD RN

## 2017-07-25 ENCOUNTER — Other Ambulatory Visit: Payer: Self-pay

## 2017-07-25 DIAGNOSIS — L97909 Non-pressure chronic ulcer of unspecified part of unspecified lower leg with unspecified severity: Principal | ICD-10-CM

## 2017-07-25 DIAGNOSIS — I83009 Varicose veins of unspecified lower extremity with ulcer of unspecified site: Secondary | ICD-10-CM

## 2017-08-01 ENCOUNTER — Encounter (HOSPITAL_COMMUNITY): Payer: Medicaid Other

## 2017-08-08 ENCOUNTER — Inpatient Hospital Stay: Payer: Medicaid Other | Attending: Oncology | Admitting: Oncology

## 2017-08-08 VITALS — BP 80/32 | HR 115 | Temp 98.0°F | Resp 18 | Ht 62.0 in | Wt 203.4 lb

## 2017-08-08 DIAGNOSIS — D508 Other iron deficiency anemias: Secondary | ICD-10-CM

## 2017-08-08 DIAGNOSIS — Z992 Dependence on renal dialysis: Secondary | ICD-10-CM | POA: Insufficient documentation

## 2017-08-08 DIAGNOSIS — J849 Interstitial pulmonary disease, unspecified: Secondary | ICD-10-CM | POA: Diagnosis not present

## 2017-08-08 DIAGNOSIS — R894 Abnormal immunological findings in specimens from other organs, systems and tissues: Secondary | ICD-10-CM | POA: Diagnosis not present

## 2017-08-08 DIAGNOSIS — N059 Unspecified nephritic syndrome with unspecified morphologic changes: Secondary | ICD-10-CM | POA: Diagnosis not present

## 2017-08-08 DIAGNOSIS — D509 Iron deficiency anemia, unspecified: Secondary | ICD-10-CM | POA: Diagnosis not present

## 2017-08-08 DIAGNOSIS — D631 Anemia in chronic kidney disease: Secondary | ICD-10-CM

## 2017-08-08 DIAGNOSIS — N186 End stage renal disease: Secondary | ICD-10-CM | POA: Diagnosis not present

## 2017-08-08 NOTE — Progress Notes (Signed)
Hematology and Oncology Follow Up Visit  Rebecca Mullins [id number] 11/23/1975 42 y.o. 08/08/2017 1:43 PM [name], FNPAnderson, [name], FNP   Principle Diagnosis: 42 year old woman with the following issues:  1.  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  She has chronic renal failure and hemodialysis dependent.  2.  Glomerulonephritis with end-stage renal disease.  She has no evidence to suggest a lymphoproliferative disorder.  3.  Interstitial lung disease with end-stage respiratory status she is currently under consideration for transplant.  4.  Elevated free kappa and lambda light chains.   Current therapy: She has been oral iron in the past and currently receiving intravenous iron with dialysis.  Interim History: Rebecca Mullins presents today for a follow-up visit.  She is a pleasant woman I saw in consultation in the past for evaluation for possible lymphoproliferative disorder.  She has a history of glomerulonephritis and interstitial lung disease and currently under evaluation for possible lung transplant. Part of her evaluation, she had a serum protein electrophoresis which showed slightly elevated IgG level although no monoclonal gammopathy detected.  She had an elevated kappa and lambda light chains with normal ratio.  Immunofixation did not show any monoclonal gammopathy.  Clinically she is able to function reasonably fair ambulating short distances.  She is able to drive and attend to activities of daily living.  She does report lower extremity edema among overall fatigue and dyspnea on exertion.    She does not report any headaches, blurry vision, syncope or seizures. Does not report any fevers, chills or sweats.  Does not report any cough, wheezing or hemoptysis.  Does not report any chest pain, palpitation, orthopnea.  Does not report any nausea, vomiting or abdominal pain.  She does not report any constipation or diarrhea.  Does  not report any skeletal complaints.    Does not report frequency, urgency or hematuria.  Does not report any lymphadenopathy or petechiae.  Does not report any anxiety or depression.  Remaining review of systems is negative.    Medications: I have reviewed the patient's current medications.  Current Outpatient Medications  Medication Sig Dispense Refill  . ferrous sulfate 325 (65 FE) MG EC tablet Take 325 mg by mouth every other day.    . gabapentin (NEURONTIN) 600 MG tablet Take 0.5 tablets (300 mg total) by mouth daily. 30 tablet 0  . levothyroxine (SYNTHROID, LEVOTHROID) 88 MCG tablet Take 88 mcg by mouth daily.    . medroxyPROGESTERone (DEPO-PROVERA) 150 MG/ML injection INJECT 1 ML (150 MG TOTAL) INTO THE MUSCLE EVERY 3 MONTHS 1 mL 1  . mycophenolate (CELLCEPT) 500 MG tablet Take 1,500 mg by mouth every 12 (twelve) hours.     Marland Kitchen NIFEdipine (PROCARDIA-XL/ADALAT-CC/NIFEDICAL-XL) 30 MG 24 hr tablet Take 1 tablet (30 mg total) by mouth daily. (Patient not taking: Reported on 07/17/2017) 30 tablet 0  . omeprazole (PRILOSEC) 40 MG capsule Take 40 mg by mouth 2 (two) times daily.   0  . predniSONE (DELTASONE) 10 MG tablet Take 20 mg by mouth daily.     . ranitidine (ZANTAC) 150 MG capsule Take 150 mg by mouth at bedtime as needed for heartburn.     . sodium bicarbonate 325 MG tablet Take 650 mg by mouth 3 (three) times daily.    Marland Kitchen sulfamethoxazole-trimethoprim (BACTRIM DS,SEPTRA DS) 800-160 MG tablet Take 1 tablet by mouth 3 (three) times a week. M W F 12 tablet 0  . Vitamin D, Ergocalciferol, (DRISDOL)  50000 units CAPS capsule Take 50,000 Units by mouth every 7 (seven) days.     No current facility-administered medications for this visit.      Allergies:  Allergies  Allergen Reactions  . Penicillins Anaphylaxis    Has patient had a PCN reaction causing immediate rash, facial/tongue/throat swelling, SOB or lightheadedness with hypotension: Yes Has patient had a PCN reaction causing severe rash  involving mucus membranes or skin necrosis: No Has patient had a PCN reaction that required hospitalization No Has patient had a PCN reaction occurring within the last 10 years: No If all of the above answers are "NO", then may proceed with Cephalosporin use.   . Nsaids     Kidney issues  . Oxycodone     Patient says unable to take this medicine.    Past Medical History, Surgical history, Social history, and Family History were reviewed and updated.   Physical Exam: Blood pressure (!) 80/32, pulse (!) 115, temperature 98 F (36.7 C), temperature source Oral, resp. rate 18, height 5\' 2"  (1.575 m), weight 203 lb 6.4 oz (92.3 kg), SpO2 95 %, unknown if currently breastfeeding.    ECOG: 1  General appearance: alert and cooperative appeared without distress. Head: Normocephalic, without obvious abnormality Oral mucosa without thrush or ulcers. Eyes: No scleral icterus. Lymph nodes: Cervical, supraclavicular, and axillary nodes normal. Heart:regular rate and rhythm, S1, S2 normal, no murmur, click, rub or gallop Lung: Dry rales noted in her lungs bilaterally. Abdomin: soft, non-tender, without masses or organomegaly Musculoskeletal: No joint effusion or deformity.  Bilateral lower extremity edema noted. Skin no rashes or lesions.  Lab Results: Lab Results  Component Value Date   WBC 11.5 (H) 07/21/2017   HGB 9.0 (L) 07/21/2017   HCT 29.3 (L) 07/21/2017   MCV 85.7 07/21/2017   PLT 321 07/21/2017     Chemistry      Component Value Date/Time   NA 134 (L) 07/21/2017 0500   K 4.3 07/21/2017 0500   CL 98 (L) 07/21/2017 0500   CO2 19 (L) 07/21/2017 0500   BUN 81 (H) 07/21/2017 0500   CREATININE 8.97 (H) 07/21/2017 0500   CREATININE 0.87 01/19/2013 1619      Component Value Date/Time   CALCIUM 7.6 (L) 07/21/2017 0500   ALKPHOS 110 07/05/2017 1405   AST 19 07/05/2017 1405   ALT 32 07/17/2017 1838   BILITOT 0.8 07/05/2017 1405        Impression and Plan:  42 year old  woman with the following issues:  1.  Multifactorial anemia.  Related to renal disease and iron deficiency.  Her hemoglobin on July 10, 2017 was 9.9.  She is already receiving iron replacement and growth factor support as a part of her dialysis.  2.  Suggestion of a lymphoproliferative disorder: She has been evaluated in the past without any evidence to suggest lymphoproliferative disorder.  Imaging studies as well as physical examination do not suggest these findings.  She had multiple imaging studies at [facility] and those were personally reviewed and showed no evidence of any malignancy.  3. 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  Currently on hemodialysis.  4. Interstitial lung disease: She is status post recent hospitalization for renal tori failure.  She follows up with pulmonary medicine regarding this issue.  5.  Elevated kappa and lambda light chains: These findings suggest a reactive elevation rather than a plasma cell disorder.  This is related to her kidney disease and  the normal ratio suggest no evidence of plasma cell disorder.  I am happy to see her in the future as needed.  15  minutes was spent with the patient face-to-face today.  More than 50% of time was dedicated to patient counseling, education and coordination of her multifaceted care.     Zola Button, MD 1/31/20191:43 PM

## 2017-08-09 ENCOUNTER — Telehealth: Payer: Self-pay | Admitting: Oncology

## 2017-08-09 NOTE — Telephone Encounter (Signed)
Per 1/31 no los 

## 2017-08-26 ENCOUNTER — Other Ambulatory Visit: Payer: Self-pay | Admitting: Nurse Practitioner

## 2017-08-26 DIAGNOSIS — Z1231 Encounter for screening mammogram for malignant neoplasm of breast: Secondary | ICD-10-CM

## 2017-08-27 ENCOUNTER — Encounter (HOSPITAL_COMMUNITY): Payer: Medicaid Other

## 2017-08-29 ENCOUNTER — Encounter: Payer: Medicaid Other | Admitting: Vascular Surgery

## 2017-09-01 ENCOUNTER — Encounter (HOSPITAL_COMMUNITY): Payer: Self-pay | Admitting: Emergency Medicine

## 2017-09-01 ENCOUNTER — Other Ambulatory Visit: Payer: Self-pay

## 2017-09-01 ENCOUNTER — Emergency Department (HOSPITAL_COMMUNITY): Payer: Medicaid Other

## 2017-09-01 ENCOUNTER — Inpatient Hospital Stay (HOSPITAL_COMMUNITY)
Admission: EM | Admit: 2017-09-01 | Discharge: 2017-09-06 | DRG: 871 | Disposition: E | Payer: Medicaid Other | Attending: Pulmonary Disease | Admitting: Pulmonary Disease

## 2017-09-01 DIAGNOSIS — A419 Sepsis, unspecified organism: Principal | ICD-10-CM | POA: Diagnosis present

## 2017-09-01 DIAGNOSIS — J9601 Acute respiratory failure with hypoxia: Secondary | ICD-10-CM | POA: Diagnosis present

## 2017-09-01 DIAGNOSIS — R0602 Shortness of breath: Secondary | ICD-10-CM

## 2017-09-01 DIAGNOSIS — L97519 Non-pressure chronic ulcer of other part of right foot with unspecified severity: Secondary | ICD-10-CM | POA: Diagnosis present

## 2017-09-01 DIAGNOSIS — D649 Anemia, unspecified: Secondary | ICD-10-CM | POA: Diagnosis present

## 2017-09-01 DIAGNOSIS — N186 End stage renal disease: Secondary | ICD-10-CM | POA: Diagnosis present

## 2017-09-01 DIAGNOSIS — R042 Hemoptysis: Secondary | ICD-10-CM | POA: Diagnosis present

## 2017-09-01 DIAGNOSIS — J810 Acute pulmonary edema: Secondary | ICD-10-CM | POA: Diagnosis present

## 2017-09-01 DIAGNOSIS — L8990 Pressure ulcer of unspecified site, unspecified stage: Secondary | ICD-10-CM | POA: Diagnosis present

## 2017-09-01 DIAGNOSIS — I5082 Biventricular heart failure: Secondary | ICD-10-CM | POA: Diagnosis present

## 2017-09-01 DIAGNOSIS — I2510 Atherosclerotic heart disease of native coronary artery without angina pectoris: Secondary | ICD-10-CM | POA: Diagnosis present

## 2017-09-01 DIAGNOSIS — R0789 Other chest pain: Secondary | ICD-10-CM

## 2017-09-01 DIAGNOSIS — R7989 Other specified abnormal findings of blood chemistry: Secondary | ICD-10-CM

## 2017-09-01 DIAGNOSIS — Z79890 Hormone replacement therapy (postmenopausal): Secondary | ICD-10-CM

## 2017-09-01 DIAGNOSIS — N2581 Secondary hyperparathyroidism of renal origin: Secondary | ICD-10-CM | POA: Diagnosis present

## 2017-09-01 DIAGNOSIS — E1122 Type 2 diabetes mellitus with diabetic chronic kidney disease: Secondary | ICD-10-CM | POA: Diagnosis present

## 2017-09-01 DIAGNOSIS — R0600 Dyspnea, unspecified: Secondary | ICD-10-CM | POA: Diagnosis present

## 2017-09-01 DIAGNOSIS — R079 Chest pain, unspecified: Secondary | ICD-10-CM | POA: Diagnosis present

## 2017-09-01 DIAGNOSIS — I272 Pulmonary hypertension, unspecified: Secondary | ICD-10-CM | POA: Diagnosis present

## 2017-09-01 DIAGNOSIS — R112 Nausea with vomiting, unspecified: Secondary | ICD-10-CM | POA: Diagnosis present

## 2017-09-01 DIAGNOSIS — E039 Hypothyroidism, unspecified: Secondary | ICD-10-CM | POA: Diagnosis present

## 2017-09-01 DIAGNOSIS — E874 Mixed disorder of acid-base balance: Secondary | ICD-10-CM | POA: Diagnosis present

## 2017-09-01 DIAGNOSIS — R6521 Severe sepsis with septic shock: Secondary | ICD-10-CM | POA: Diagnosis present

## 2017-09-01 DIAGNOSIS — I2699 Other pulmonary embolism without acute cor pulmonale: Secondary | ICD-10-CM | POA: Diagnosis present

## 2017-09-01 DIAGNOSIS — I120 Hypertensive chronic kidney disease with stage 5 chronic kidney disease or end stage renal disease: Secondary | ICD-10-CM | POA: Diagnosis present

## 2017-09-01 DIAGNOSIS — J9600 Acute respiratory failure, unspecified whether with hypoxia or hypercapnia: Secondary | ICD-10-CM

## 2017-09-01 DIAGNOSIS — R778 Other specified abnormalities of plasma proteins: Secondary | ICD-10-CM | POA: Diagnosis present

## 2017-09-01 DIAGNOSIS — E1151 Type 2 diabetes mellitus with diabetic peripheral angiopathy without gangrene: Secondary | ICD-10-CM | POA: Diagnosis present

## 2017-09-01 DIAGNOSIS — R570 Cardiogenic shock: Secondary | ICD-10-CM

## 2017-09-01 DIAGNOSIS — L89619 Pressure ulcer of right heel, unspecified stage: Secondary | ICD-10-CM | POA: Diagnosis present

## 2017-09-01 DIAGNOSIS — R6510 Systemic inflammatory response syndrome (SIRS) of non-infectious origin without acute organ dysfunction: Secondary | ICD-10-CM

## 2017-09-01 DIAGNOSIS — I214 Non-ST elevation (NSTEMI) myocardial infarction: Secondary | ICD-10-CM | POA: Diagnosis present

## 2017-09-01 DIAGNOSIS — Z7901 Long term (current) use of anticoagulants: Secondary | ICD-10-CM

## 2017-09-01 DIAGNOSIS — Z87891 Personal history of nicotine dependence: Secondary | ICD-10-CM

## 2017-09-01 DIAGNOSIS — E11621 Type 2 diabetes mellitus with foot ulcer: Secondary | ICD-10-CM | POA: Diagnosis present

## 2017-09-01 DIAGNOSIS — K219 Gastro-esophageal reflux disease without esophagitis: Secondary | ICD-10-CM | POA: Diagnosis present

## 2017-09-01 DIAGNOSIS — Z992 Dependence on renal dialysis: Secondary | ICD-10-CM

## 2017-09-01 DIAGNOSIS — E785 Hyperlipidemia, unspecified: Secondary | ICD-10-CM | POA: Diagnosis present

## 2017-09-01 DIAGNOSIS — M349 Systemic sclerosis, unspecified: Secondary | ICD-10-CM | POA: Diagnosis present

## 2017-09-01 DIAGNOSIS — N390 Urinary tract infection, site not specified: Secondary | ICD-10-CM | POA: Diagnosis present

## 2017-09-01 DIAGNOSIS — R0902 Hypoxemia: Secondary | ICD-10-CM

## 2017-09-01 DIAGNOSIS — E875 Hyperkalemia: Secondary | ICD-10-CM | POA: Diagnosis present

## 2017-09-01 DIAGNOSIS — J849 Interstitial pulmonary disease, unspecified: Secondary | ICD-10-CM | POA: Diagnosis present

## 2017-09-01 DIAGNOSIS — R0689 Other abnormalities of breathing: Secondary | ICD-10-CM

## 2017-09-01 DIAGNOSIS — Z833 Family history of diabetes mellitus: Secondary | ICD-10-CM

## 2017-09-01 DIAGNOSIS — Z86711 Personal history of pulmonary embolism: Secondary | ICD-10-CM

## 2017-09-01 LAB — I-STAT TROPONIN, ED: Troponin i, poc: 0.88 ng/mL (ref 0.00–0.08)

## 2017-09-01 LAB — I-STAT BETA HCG BLOOD, ED (MC, WL, AP ONLY): HCG, QUANTITATIVE: 12 m[IU]/mL — AB (ref <5)

## 2017-09-01 LAB — COMPREHENSIVE METABOLIC PANEL
ALBUMIN: 3.7 g/dL (ref 3.5–5.0)
ALT: 28 U/L (ref 14–54)
AST: 41 U/L (ref 15–41)
Alkaline Phosphatase: 174 U/L — ABNORMAL HIGH (ref 38–126)
Anion gap: 20 — ABNORMAL HIGH (ref 5–15)
BUN: 61 mg/dL — AB (ref 6–20)
CALCIUM: 9.4 mg/dL (ref 8.9–10.3)
CO2: 20 mmol/L — AB (ref 22–32)
CREATININE: 9.61 mg/dL — AB (ref 0.44–1.00)
Chloride: 98 mmol/L — ABNORMAL LOW (ref 101–111)
GFR calc Af Amer: 5 mL/min — ABNORMAL LOW (ref >60)
GFR calc non Af Amer: 4 mL/min — ABNORMAL LOW (ref >60)
GLUCOSE: 153 mg/dL — AB (ref 65–99)
Potassium: 3.9 mmol/L (ref 3.5–5.1)
SODIUM: 138 mmol/L (ref 135–145)
Total Bilirubin: 0.5 mg/dL (ref 0.3–1.2)
Total Protein: 8.4 g/dL — ABNORMAL HIGH (ref 6.5–8.1)

## 2017-09-01 LAB — CBC WITH DIFFERENTIAL/PLATELET
BASOS PCT: 0 %
Basophils Absolute: 0.1 10*3/uL (ref 0.0–0.1)
EOS ABS: 0.3 10*3/uL (ref 0.0–0.7)
Eosinophils Relative: 1 %
HCT: 37.7 % (ref 36.0–46.0)
Hemoglobin: 11.9 g/dL — ABNORMAL LOW (ref 12.0–15.0)
LYMPHS ABS: 1.9 10*3/uL (ref 0.7–4.0)
Lymphocytes Relative: 8 %
MCH: 28.3 pg (ref 26.0–34.0)
MCHC: 31.6 g/dL (ref 30.0–36.0)
MCV: 89.5 fL (ref 78.0–100.0)
MONO ABS: 1.2 10*3/uL — AB (ref 0.1–1.0)
MONOS PCT: 5 %
Neutro Abs: 19.9 10*3/uL — ABNORMAL HIGH (ref 1.7–7.7)
Neutrophils Relative %: 86 %
Platelets: 321 10*3/uL (ref 150–400)
RBC: 4.21 MIL/uL (ref 3.87–5.11)
RDW: 16.2 % — AB (ref 11.5–15.5)
WBC: 23.3 10*3/uL — ABNORMAL HIGH (ref 4.0–10.5)

## 2017-09-01 LAB — I-STAT CG4 LACTIC ACID, ED: Lactic Acid, Venous: 2.69 mmol/L (ref 0.5–1.9)

## 2017-09-01 NOTE — ED Notes (Signed)
Pt not in BH04

## 2017-09-01 NOTE — ED Notes (Signed)
Reject x1 notified nurse.

## 2017-09-01 NOTE — ED Provider Notes (Signed)
TIME SEEN: 11:30 PM  CHIEF COMPLAINT: Chest pain, shortness of breath  HPI: Patient is a 42 year old female with history of diabetes, scleroderma, chronic kidney disease now on hemodialysis for the past 2 months, recent diagnosis of a pulmonary embolus in the right lower lobe on February 12 now on Eliquis who presents to the emergency department with complaints of feeling like her heart is beating fast.  This started sometime after eating dinner around 6 PM.  States she had nausea and vomiting.  States she started feeling short of breath and had productive cough.  Also feels like both of her legs have been swollen.  Last dialysis was the 22nd.  She goes every Monday, Wednesday and Friday and has not missed any dialysis.  Has dialysis catheter in her right chest.  Does not wear oxygen at home.  Denies any known fever.  ROS: See HPI Constitutional: no fever  Eyes: no drainage  ENT: no runny nose   Cardiovascular:   chest pain  Resp:  SOB  GI: vomiting GU: no dysuria Integumentary: no rash  Allergy: no hives  Musculoskeletal: no leg swelling  Neurological: no slurred speech ROS otherwise negative  PAST MEDICAL HISTORY/PAST SURGICAL HISTORY:  Past Medical History:  Diagnosis Date  . Anemia   . Anxiety   . Bilateral cellulitis of lower leg   . Chronic kidney disease    stage 5  . Dyspnea   . GERD (gastroesophageal reflux disease)   . Glomerulonephritis    Archie Endo 07/25/2015  . Hypertension    "just starting" (07/27/2015)  . Hypothyroidism   . Pneumonia   . Poor circulation    Raynaud's disease  . Scleroderma ([place])    "fingers" (07/27/2015)  . Strep throat 1977; winter 2016  . Type II diabetes mellitus ([place]) dx'd 09/2014    MEDICATIONS:  Prior to Admission medications   Medication Sig Start Date End Date Taking? Authorizing Provider  ferrous sulfate 325 (65 FE) MG EC tablet Take 325 mg by mouth every other day.    [provider]  gabapentin (NEURONTIN) 600 MG tablet  Take 0.5 tablets (300 mg total) by mouth daily. 07/21/17   Kayleen Memos, DO  levothyroxine (SYNTHROID, LEVOTHROID) 88 MCG tablet Take 88 mcg by mouth daily.    [provider]  medroxyPROGESTERone (DEPO-PROVERA) 150 MG/ML injection INJECT 1 ML (150 MG TOTAL) INTO THE MUSCLE EVERY 3 MONTHS 03/05/16   Ivar Drape D, PA  mycophenolate (CELLCEPT) 500 MG tablet Take 1,500 mg by mouth every 12 (twelve) hours.     [provider]  ranitidine (ZANTAC) 150 MG capsule Take 150 mg by mouth at bedtime as needed for heartburn.  04/27/16 07/17/17  [provider]  Vitamin D, Ergocalciferol, (DRISDOL) 50000 units CAPS capsule Take 50,000 Units by mouth every 7 (seven) days.    [provider]    ALLERGIES:  Allergies  Allergen Reactions  . Penicillins Anaphylaxis    Has patient had a PCN reaction causing immediate rash, facial/tongue/throat swelling, SOB or lightheadedness with hypotension: Yes Has patient had a PCN reaction causing severe rash involving mucus membranes or skin necrosis: No Has patient had a PCN reaction that required hospitalization No Has patient had a PCN reaction occurring within the last 10 years: No If all of the above answers are "NO", then may proceed with Cephalosporin use.   . Nsaids     Kidney issues  . Oxycodone     Patient says unable to take this medicine.  SOCIAL HISTORY:  Social History   Tobacco Use  . Smoking status: Former Smoker    Packs/day: 1.00    Years: 2.00    Pack years: 2.00    Types: Cigarettes    Start date: 02/28/1993    Last attempt to quit: 03/01/1995    Years since quitting: 22.5  . Smokeless tobacco: Never Used  Substance Use Topics  . Alcohol use: Yes    Comment: 07/27/2015 "might have a couple glasses of beer or wine q couple weeks"    FAMILY HISTORY: Family History  Problem Relation Age of Onset  . Diabetes Mother   . Cancer Mother 93       breast  . Hyperlipidemia Father   . Diabetes  Brother     EXAM: BP 112/79 (BP Location: Right Arm)   Temp 98 F (36.7 C) (Oral)   Resp 18  CONSTITUTIONAL: Alert and oriented and responds appropriately to questions.  Chronically ill-appearing, rectal temp of 98 HEAD: Normocephalic EYES: Conjunctivae clear, pupils appear equal, EOMI ENT: normal nose; moist mucous membranes NECK: Supple, no meningismus, no nuchal rigidity, no LAD  CARD: Regular and tachycardic; S1 and S2 appreciated; no murmurs, no clicks, no rubs, no gallops RESP: Patient is tachypneic, her breath sounds are clear and equal bilaterally and there is no wheezing, rhonchi or rales noted.  Initially not hypoxic but when she is talking to me she does have oxygen saturations that will drop.  She is speaking short sentences. ABD/GI: Normal bowel sounds; non-distended; soft, non-tender, no rebound, no guarding, no peritoneal signs, no hepatosplenomegaly BACK:  The back appears normal and is non-tender to palpation, there is no CVA tenderness EXT: Normal ROM in all joints; non-tender to palpation; nonpitting bilateral lower extremity noted; normal capillary refill; no cyanosis, no calf tenderness or swelling    SKIN: Normal color for age and race; warm; no rash NEURO: Moves all extremities equally PSYCH: The patient's mood and manner are appropriate. Grooming and personal hygiene are appropriate.  MEDICAL DECISION MAKING: Patient with palpitations and shortness of breath.  She is tachycardic and tachypneic.  Differential diagnosis includes pneumonia, PE, volume overload.  She does appear slightly volume overloaded currently.  No hypotension noted.  Her lungs are currently clear but she does have a drop in oxygen saturation when talking.  Quickly comes back up into the upper 90s at rest.  She had a CT scan of her chest on the 12th with [facility] that showed a right lower lobe pulmonary embolus.  She reports compliance with Eliquis.  Has only been on dialysis for 2 months.  Will obtain  labs, chest x-ray, blood cultures, flu swab.  Rectal temp normal.  ED PROGRESS: She does have a leukocytosis of 23,000 with left shift.  Lactate mildly elevated but this may be a combination of infection versus respiratory status.  I feel that she appears volume overloaded on exam and at this time I will hold off on IV fluids.  Had this discussion with patient and family and they are comfortable with this as well.  We will monitor her closely.  She has not had any hypotension.  Her troponin is also positive which may be secondary to demand ischemia from being tachycardic in the 120s and also from chronic kidney disease.  Given patient meets SIRs criteria as she is tachycardic, tachypneic and has a leukocytosis that appears new for her, will start broad-spectrum antibiotics.  I feel patient will need to be admitted to the hospital.  I feel that she will likely need further imaging of her chest to evaluate to see if her pulmonary embolus is worse but unfortunately given she just started on dialysis I do not feel a contrast CT is appropriate.  Will give dose of Lovenox in the ED.  1:07 AM Discussed patient's case with hospitalist, Dr. Blaine Hamper.  I have recommended admission and patient (and family if present) agree with this plan. Admitting physician will place admission orders.    Patient has some hypotension documented.  Currently blood pressure 101/83.  Will give 500 mL IV fluid bolus and watch cautiously given I am concerned that she does appear volume overloaded.  Hospitalist agrees with this plan.    I reviewed all nursing notes, vitals, pertinent previous records, EKGs, lab and urine results, imaging (as available).     EKG Interpretation  Date/Time:  Sunday September 01 2017 22:25:21 EST Ventricular Rate:  127 PR Interval:  144 QRS Duration: 98 QT Interval:  310 QTC Calculation: 450 R Axis:   50 Text Interpretation:  Sinus tachycardia Possible Left atrial enlargement Marked ST abnormality,  possible inferior subendocardial injury Abnormal ECG Confirmed by Pryor Curia [phone]) on 08/18/2017 11:46:39 PM        CRITICAL CARE Performed by: Cyril Mourning Kaeleen Odom   Total critical care time: 45 minutes  Critical care time was exclusive of separately billable procedures and treating other patients.  Critical care was necessary to treat or prevent imminent or life-threatening deterioration.  Critical care was time spent personally by me on the following activities: development of treatment plan with patient and/or surrogate as well as nursing, discussions with consultants, evaluation of patient's response to treatment, examination of patient, obtaining history from patient or surrogate, ordering and performing treatments and interventions, ordering and review of laboratory studies, ordering and review of radiographic studies, pulse oximetry and re-evaluation of patient's condition.     Latia Mataya, Delice Bison, DO 09/02/17 [phone]

## 2017-09-01 NOTE — ED Notes (Signed)
Nurse starting another IV and will draw labs.

## 2017-09-01 NOTE — ED Notes (Signed)
Writer notified EDP Ward of abnormal I stat lactic and trop.

## 2017-09-01 NOTE — ED Triage Notes (Signed)
Pt presents with palpitations with N/V x 1-1.5hrs PTA; pt states she was driving when they started; EMS also noted that patient was tachypnic, applied 2L supplemental O2 via [place] and rate came down to 24 from 36; pt denies pain at this time; pt is also a HD (access is R chest) MWF with no missed treatments; pt hx of PE with eliquis

## 2017-09-02 ENCOUNTER — Other Ambulatory Visit (HOSPITAL_COMMUNITY): Payer: Medicaid Other

## 2017-09-02 ENCOUNTER — Inpatient Hospital Stay (HOSPITAL_COMMUNITY): Payer: Medicaid Other

## 2017-09-02 DIAGNOSIS — M349 Systemic sclerosis, unspecified: Secondary | ICD-10-CM | POA: Diagnosis present

## 2017-09-02 DIAGNOSIS — R0600 Dyspnea, unspecified: Secondary | ICD-10-CM

## 2017-09-02 DIAGNOSIS — J9601 Acute respiratory failure with hypoxia: Secondary | ICD-10-CM | POA: Diagnosis present

## 2017-09-02 DIAGNOSIS — N390 Urinary tract infection, site not specified: Secondary | ICD-10-CM | POA: Diagnosis present

## 2017-09-02 DIAGNOSIS — N186 End stage renal disease: Secondary | ICD-10-CM

## 2017-09-02 DIAGNOSIS — E039 Hypothyroidism, unspecified: Secondary | ICD-10-CM | POA: Diagnosis present

## 2017-09-02 DIAGNOSIS — I214 Non-ST elevation (NSTEMI) myocardial infarction: Secondary | ICD-10-CM | POA: Diagnosis present

## 2017-09-02 DIAGNOSIS — E875 Hyperkalemia: Secondary | ICD-10-CM | POA: Diagnosis present

## 2017-09-02 DIAGNOSIS — Z86711 Personal history of pulmonary embolism: Secondary | ICD-10-CM | POA: Diagnosis not present

## 2017-09-02 DIAGNOSIS — I2699 Other pulmonary embolism without acute cor pulmonale: Secondary | ICD-10-CM | POA: Diagnosis not present

## 2017-09-02 DIAGNOSIS — R778 Other specified abnormalities of plasma proteins: Secondary | ICD-10-CM | POA: Diagnosis present

## 2017-09-02 DIAGNOSIS — J9600 Acute respiratory failure, unspecified whether with hypoxia or hypercapnia: Secondary | ICD-10-CM | POA: Diagnosis not present

## 2017-09-02 DIAGNOSIS — A419 Sepsis, unspecified organism: Secondary | ICD-10-CM | POA: Diagnosis present

## 2017-09-02 DIAGNOSIS — Z992 Dependence on renal dialysis: Secondary | ICD-10-CM | POA: Diagnosis not present

## 2017-09-02 DIAGNOSIS — R0689 Other abnormalities of breathing: Secondary | ICD-10-CM | POA: Diagnosis not present

## 2017-09-02 DIAGNOSIS — I120 Hypertensive chronic kidney disease with stage 5 chronic kidney disease or end stage renal disease: Secondary | ICD-10-CM | POA: Diagnosis present

## 2017-09-02 DIAGNOSIS — J849 Interstitial pulmonary disease, unspecified: Secondary | ICD-10-CM

## 2017-09-02 DIAGNOSIS — G43A0 Cyclical vomiting, not intractable: Secondary | ICD-10-CM | POA: Diagnosis not present

## 2017-09-02 DIAGNOSIS — L89619 Pressure ulcer of right heel, unspecified stage: Secondary | ICD-10-CM | POA: Diagnosis present

## 2017-09-02 DIAGNOSIS — Z833 Family history of diabetes mellitus: Secondary | ICD-10-CM | POA: Diagnosis not present

## 2017-09-02 DIAGNOSIS — J810 Acute pulmonary edema: Secondary | ICD-10-CM | POA: Diagnosis present

## 2017-09-02 DIAGNOSIS — Z79890 Hormone replacement therapy (postmenopausal): Secondary | ICD-10-CM | POA: Diagnosis not present

## 2017-09-02 DIAGNOSIS — R570 Cardiogenic shock: Secondary | ICD-10-CM | POA: Diagnosis not present

## 2017-09-02 DIAGNOSIS — I5021 Acute systolic (congestive) heart failure: Secondary | ICD-10-CM | POA: Diagnosis not present

## 2017-09-02 DIAGNOSIS — R748 Abnormal levels of other serum enzymes: Secondary | ICD-10-CM

## 2017-09-02 DIAGNOSIS — R079 Chest pain, unspecified: Secondary | ICD-10-CM

## 2017-09-02 DIAGNOSIS — Z7901 Long term (current) use of anticoagulants: Secondary | ICD-10-CM | POA: Diagnosis not present

## 2017-09-02 DIAGNOSIS — E874 Mixed disorder of acid-base balance: Secondary | ICD-10-CM | POA: Diagnosis present

## 2017-09-02 DIAGNOSIS — R042 Hemoptysis: Secondary | ICD-10-CM | POA: Diagnosis present

## 2017-09-02 DIAGNOSIS — R6510 Systemic inflammatory response syndrome (SIRS) of non-infectious origin without acute organ dysfunction: Secondary | ICD-10-CM

## 2017-09-02 DIAGNOSIS — I469 Cardiac arrest, cause unspecified: Secondary | ICD-10-CM | POA: Diagnosis not present

## 2017-09-02 DIAGNOSIS — R6521 Severe sepsis with septic shock: Secondary | ICD-10-CM | POA: Diagnosis present

## 2017-09-02 DIAGNOSIS — R7989 Other specified abnormal findings of blood chemistry: Secondary | ICD-10-CM

## 2017-09-02 DIAGNOSIS — N2581 Secondary hyperparathyroidism of renal origin: Secondary | ICD-10-CM | POA: Diagnosis present

## 2017-09-02 DIAGNOSIS — K219 Gastro-esophageal reflux disease without esophagitis: Secondary | ICD-10-CM | POA: Diagnosis present

## 2017-09-02 DIAGNOSIS — I340 Nonrheumatic mitral (valve) insufficiency: Secondary | ICD-10-CM | POA: Diagnosis not present

## 2017-09-02 DIAGNOSIS — R112 Nausea with vomiting, unspecified: Secondary | ICD-10-CM | POA: Diagnosis present

## 2017-09-02 DIAGNOSIS — Z87891 Personal history of nicotine dependence: Secondary | ICD-10-CM | POA: Diagnosis not present

## 2017-09-02 DIAGNOSIS — R0602 Shortness of breath: Secondary | ICD-10-CM | POA: Diagnosis present

## 2017-09-02 DIAGNOSIS — E1122 Type 2 diabetes mellitus with diabetic chronic kidney disease: Secondary | ICD-10-CM | POA: Diagnosis present

## 2017-09-02 LAB — TROPONIN I
TROPONIN I: 4.78 ng/mL — AB (ref <0.03)
TROPONIN I: 30.34 ng/mL — AB (ref <0.03)
TROPONIN I: 29.59 ng/mL — AB (ref <0.03)

## 2017-09-02 LAB — URINALYSIS, ROUTINE W REFLEX MICROSCOPIC
BILIRUBIN URINE: NEGATIVE
Glucose, UA: 50 mg/dL — AB
KETONES UR: NEGATIVE mg/dL
Nitrite: NEGATIVE
Protein, ur: 100 mg/dL — AB
SPECIFIC GRAVITY, URINE: 1.011 (ref 1.005–1.030)
pH: 7 (ref 5.0–8.0)

## 2017-09-02 LAB — I-STAT ARTERIAL BLOOD GAS, ED
Acid-base deficit: 7 mmol/L — ABNORMAL HIGH (ref 0.0–2.0)
Bicarbonate: 16.2 mmol/L — ABNORMAL LOW (ref 20.0–28.0)
O2 Saturation: 83 %
PCO2 ART: 27 mmHg — AB (ref 32.0–48.0)
TCO2: 17 mmol/L — AB (ref 22–32)
pH, Arterial: 7.388 (ref 7.350–7.450)
pO2, Arterial: 47 mmHg — ABNORMAL LOW (ref 83.0–108.0)

## 2017-09-02 LAB — LIPID PANEL
CHOL/HDL RATIO: 8.4 ratio
Cholesterol: 268 mg/dL — ABNORMAL HIGH (ref 0–200)
HDL: 32 mg/dL — ABNORMAL LOW (ref >40)
LDL CALC: 177 mg/dL — AB (ref 0–99)
Triglycerides: 296 mg/dL — ABNORMAL HIGH (ref <150)
VLDL: 59 mg/dL — AB (ref 0–40)

## 2017-09-02 LAB — GLUCOSE, CAPILLARY: Glucose-Capillary: 140 mg/dL — ABNORMAL HIGH (ref 65–99)

## 2017-09-02 LAB — I-STAT CG4 LACTIC ACID, ED: Lactic Acid, Venous: 2.54 mmol/L (ref 0.5–1.9)

## 2017-09-02 LAB — COMPREHENSIVE METABOLIC PANEL
ALBUMIN: 3.3 g/dL — AB (ref 3.5–5.0)
ALK PHOS: 165 U/L — AB (ref 38–126)
ALT: 34 U/L (ref 14–54)
AST: 71 U/L — ABNORMAL HIGH (ref 15–41)
Anion gap: 19 — ABNORMAL HIGH (ref 5–15)
BILIRUBIN TOTAL: 1.2 mg/dL (ref 0.3–1.2)
BUN: 61 mg/dL — ABNORMAL HIGH (ref 6–20)
CALCIUM: 8.8 mg/dL — AB (ref 8.9–10.3)
CO2: 17 mmol/L — AB (ref 22–32)
CREATININE: 9.62 mg/dL — AB (ref 0.44–1.00)
Chloride: 99 mmol/L — ABNORMAL LOW (ref 101–111)
GFR calc Af Amer: 5 mL/min — ABNORMAL LOW (ref >60)
GFR calc non Af Amer: 4 mL/min — ABNORMAL LOW (ref >60)
GLUCOSE: 150 mg/dL — AB (ref 65–99)
Potassium: 5.2 mmol/L — ABNORMAL HIGH (ref 3.5–5.1)
SODIUM: 135 mmol/L (ref 135–145)
TOTAL PROTEIN: 7.3 g/dL (ref 6.5–8.1)

## 2017-09-02 LAB — LACTIC ACID, PLASMA: Lactic Acid, Venous: 2.1 mmol/L (ref 0.5–1.9)

## 2017-09-02 LAB — ECHOCARDIOGRAM COMPLETE
HEIGHTINCHES: 62 in
Weight: 3276.92 oz

## 2017-09-02 LAB — HCG, QUANTITATIVE, PREGNANCY: hCG, Beta Chain, Quant, S: 15 m[IU]/mL — ABNORMAL HIGH (ref <5)

## 2017-09-02 LAB — PROTIME-INR
INR: 1.24
Prothrombin Time: 15.5 seconds — ABNORMAL HIGH (ref 11.4–15.2)

## 2017-09-02 LAB — APTT: aPTT: 40 seconds — ABNORMAL HIGH (ref 24–36)

## 2017-09-02 LAB — LIPASE, BLOOD: Lipase: 57 U/L — ABNORMAL HIGH (ref 11–51)

## 2017-09-02 LAB — INFLUENZA PANEL BY PCR (TYPE A & B)
INFLAPCR: NEGATIVE
INFLBPCR: NEGATIVE

## 2017-09-02 LAB — CORTISOL-AM, BLOOD: Cortisol - AM: 24 ug/dL — ABNORMAL HIGH (ref 6.7–22.6)

## 2017-09-02 LAB — PROCALCITONIN: Procalcitonin: 1.35 ng/mL

## 2017-09-02 MED ORDER — DEXTROSE 5 % IV SOLN
500.0000 mg | Freq: Two times a day (BID) | INTRAVENOUS | Status: DC
  Administered 2017-09-02 – 2017-09-03 (×2): 500 mg via INTRAVENOUS
  Filled 2017-09-02 (×4): qty 0.5

## 2017-09-02 MED ORDER — FUROSEMIDE 10 MG/ML IJ SOLN
40.0000 mg | Freq: Once | INTRAMUSCULAR | Status: AC
  Administered 2017-09-02: 40 mg via INTRAVENOUS
  Filled 2017-09-02: qty 4

## 2017-09-02 MED ORDER — HYDRALAZINE HCL 20 MG/ML IJ SOLN: 5.0000 mg | INTRAMUSCULAR | Status: DC | PRN

## 2017-09-02 MED ORDER — NITROGLYCERIN IN D5W 200-5 MCG/ML-% IV SOLN
0.0000 ug/min | INTRAVENOUS | Status: DC
  Filled 2017-09-02: qty 250

## 2017-09-02 MED ORDER — SODIUM CHLORIDE 0.9 % IV SOLN
INTRAVENOUS | Status: DC
  Administered 2017-09-02: 02:00:00 via INTRAVENOUS

## 2017-09-02 MED ORDER — ENOXAPARIN SODIUM 100 MG/ML ~~LOC~~ SOLN
1.0000 mg/kg | Freq: Once | SUBCUTANEOUS | Status: AC
  Administered 2017-09-02: 95 mg via SUBCUTANEOUS
  Filled 2017-09-02: qty 1

## 2017-09-02 MED ORDER — GABAPENTIN 600 MG PO TABS
300.0000 mg | ORAL_TABLET | Freq: Every day | ORAL | Status: DC
  Filled 2017-09-02: qty 0.5

## 2017-09-02 MED ORDER — RENA-VITE PO TABS: 1.0000 | ORAL_TABLET | Freq: Every day | ORAL | Status: DC

## 2017-09-02 MED ORDER — MORPHINE SULFATE (PF) 4 MG/ML IV SOLN
2.0000 mg | INTRAVENOUS | Status: DC | PRN
  Administered 2017-09-02: 4 mg via INTRAVENOUS

## 2017-09-02 MED ORDER — APIXABAN 5 MG PO TABS
5.0000 mg | ORAL_TABLET | Freq: Two times a day (BID) | ORAL | Status: DC
  Administered 2017-09-02: 5 mg via ORAL
  Filled 2017-09-02 (×2): qty 1

## 2017-09-02 MED ORDER — AZTREONAM 2 G IJ SOLR
2.0000 g | Freq: Once | INTRAMUSCULAR | Status: AC
  Administered 2017-09-02: 2 g via INTRAVENOUS
  Filled 2017-09-02: qty 2

## 2017-09-02 MED ORDER — METOPROLOL TARTRATE 5 MG/5ML IV SOLN: 5.0000 mg | Freq: Four times a day (QID) | INTRAVENOUS | Status: DC

## 2017-09-02 MED ORDER — ALPRAZOLAM 0.25 MG PO TABS
0.2500 mg | ORAL_TABLET | Freq: Two times a day (BID) | ORAL | Status: DC | PRN
  Administered 2017-09-02 – 2017-09-03 (×2): 0.25 mg via ORAL
  Filled 2017-09-02 (×2): qty 1

## 2017-09-02 MED ORDER — LEVOTHYROXINE SODIUM 88 MCG PO TABS
88.0000 ug | ORAL_TABLET | Freq: Every day | ORAL | Status: DC
  Filled 2017-09-02 (×2): qty 1

## 2017-09-02 MED ORDER — DM-GUAIFENESIN ER 30-600 MG PO TB12: 1.0000 | ORAL_TABLET | Freq: Two times a day (BID) | ORAL | Status: DC | PRN

## 2017-09-02 MED ORDER — METOPROLOL TARTRATE 5 MG/5ML IV SOLN
2.5000 mg | INTRAVENOUS | Status: DC | PRN
  Administered 2017-09-02: 2.5 mg via INTRAVENOUS
  Filled 2017-09-02: qty 5

## 2017-09-02 MED ORDER — ASPIRIN 81 MG PO CHEW
324.0000 mg | CHEWABLE_TABLET | Freq: Once | ORAL | Status: AC
Start: 2017-09-02 — End: 2017-09-02
  Administered 2017-09-02: 324 mg via ORAL
  Filled 2017-09-02: qty 4

## 2017-09-02 MED ORDER — VANCOMYCIN HCL IN DEXTROSE 1-5 GM/200ML-% IV SOLN: 1000.0000 mg | INTRAVENOUS | Status: DC

## 2017-09-02 MED ORDER — MORPHINE SULFATE (PF) 4 MG/ML IV SOLN
1.0000 mg | INTRAVENOUS | Status: DC | PRN
  Administered 2017-09-02 – 2017-09-03 (×2): 2 mg via INTRAVENOUS
  Filled 2017-09-02 (×2): qty 1

## 2017-09-02 MED ORDER — SODIUM CHLORIDE 0.9 % IV BOLUS (SEPSIS): 500.0000 mL | Freq: Once | INTRAVENOUS | Status: DC

## 2017-09-02 MED ORDER — LEVALBUTEROL HCL 1.25 MG/0.5ML IN NEBU
INHALATION_SOLUTION | RESPIRATORY_TRACT | Status: AC
  Administered 2017-09-02: 1.25 mg
  Filled 2017-09-02: qty 0.5

## 2017-09-02 MED ORDER — SODIUM POLYSTYRENE SULFONATE 15 GM/60ML PO SUSP
15.0000 g | Freq: Once | ORAL | Status: DC
  Filled 2017-09-02: qty 60

## 2017-09-02 MED ORDER — PREDNISONE 10 MG PO TABS
10.0000 mg | ORAL_TABLET | Freq: Every day | ORAL | Status: DC
  Administered 2017-09-02: 10 mg via ORAL
  Filled 2017-09-02: qty 1

## 2017-09-02 MED ORDER — ACETAMINOPHEN 325 MG PO TABS: 650.0000 mg | ORAL_TABLET | ORAL | Status: DC | PRN

## 2017-09-02 MED ORDER — VANCOMYCIN HCL 10 G IV SOLR
2000.0000 mg | Freq: Once | INTRAVENOUS | Status: AC
  Administered 2017-09-02: 2000 mg via INTRAVENOUS
  Filled 2017-09-02: qty 2000

## 2017-09-02 MED ORDER — LEVOTHYROXINE SODIUM 100 MCG IV SOLR: 44.0000 ug | Freq: Every day | INTRAVENOUS | Status: DC

## 2017-09-02 MED ORDER — SODIUM CHLORIDE 0.9 % IV SOLN
0.0000 ug/min | INTRAVENOUS | Status: DC
  Administered 2017-09-03: 80 ug/min via INTRAVENOUS
  Administered 2017-09-03: 20 ug/min via INTRAVENOUS
  Administered 2017-09-03: 250 ug/min via INTRAVENOUS
  Administered 2017-09-03: 110 ug/min via INTRAVENOUS
  Filled 2017-09-02: qty 1
  Filled 2017-09-02: qty 10
  Filled 2017-09-02 (×5): qty 1

## 2017-09-02 MED ORDER — LIDOCAINE-PRILOCAINE 2.5-2.5 % EX CREA
1.0000 "application " | TOPICAL_CREAM | CUTANEOUS | Status: DC | PRN
  Filled 2017-09-02: qty 5

## 2017-09-02 MED ORDER — ZOLPIDEM TARTRATE 5 MG PO TABS: 5.0000 mg | ORAL_TABLET | Freq: Every evening | ORAL | Status: DC | PRN

## 2017-09-02 MED ORDER — METOPROLOL TARTRATE 5 MG/5ML IV SOLN
2.5000 mg | Freq: Once | INTRAVENOUS | Status: AC
  Administered 2017-09-02: 2.5 mg via INTRAVENOUS
  Filled 2017-09-02: qty 5

## 2017-09-02 MED ORDER — LIDOCAINE HCL (PF) 1 % IJ SOLN: 5.0000 mL | INTRAMUSCULAR | Status: DC | PRN

## 2017-09-02 MED ORDER — ONDANSETRON HCL 4 MG/2ML IJ SOLN: 4.0000 mg | Freq: Four times a day (QID) | INTRAMUSCULAR | Status: DC | PRN

## 2017-09-02 MED ORDER — MYCOPHENOLATE MOFETIL 250 MG PO CAPS
1500.0000 mg | ORAL_CAPSULE | Freq: Two times a day (BID) | ORAL | Status: DC
  Administered 2017-09-02 (×2): 1500 mg via ORAL
  Filled 2017-09-02 (×2): qty 6

## 2017-09-02 MED ORDER — HYDROCORTISONE NA SUCCINATE PF 100 MG IJ SOLR
100.0000 mg | Freq: Once | INTRAMUSCULAR | Status: AC
  Administered 2017-09-02: 100 mg via INTRAVENOUS
  Filled 2017-09-02: qty 2

## 2017-09-02 MED ORDER — PENTAFLUOROPROP-TETRAFLUOROETH EX AERO
1.0000 "application " | INHALATION_SPRAY | CUTANEOUS | Status: DC | PRN
  Filled 2017-09-02: qty 30

## 2017-09-02 MED ORDER — HEPARIN SODIUM (PORCINE) 1000 UNIT/ML DIALYSIS
1000.0000 [IU] | INTRAMUSCULAR | Status: DC | PRN
  Administered 2017-09-03: 1000 [IU] via INTRAVENOUS_CENTRAL
  Filled 2017-09-02: qty 1

## 2017-09-02 MED ORDER — HEPARIN (PORCINE) IN NACL 100-0.45 UNIT/ML-% IJ SOLN
1150.0000 [IU]/h | INTRAMUSCULAR | Status: DC
  Administered 2017-09-02: 1150 [IU]/h via INTRAVENOUS
  Filled 2017-09-02 (×2): qty 250

## 2017-09-02 MED ORDER — MORPHINE SULFATE (PF) 4 MG/ML IV SOLN
2.0000 mg | INTRAVENOUS | Status: DC | PRN
  Filled 2017-09-02: qty 1

## 2017-09-02 MED ORDER — SODIUM CHLORIDE 0.9 % IV SOLN: 100.0000 mL | INTRAVENOUS | Status: DC | PRN

## 2017-09-02 MED ORDER — LEVALBUTEROL HCL 1.25 MG/0.5ML IN NEBU
1.2500 mg | INHALATION_SOLUTION | Freq: Four times a day (QID) | RESPIRATORY_TRACT | Status: DC
  Administered 2017-09-02: 1.25 mg via RESPIRATORY_TRACT
  Filled 2017-09-02 (×7): qty 0.5

## 2017-09-02 MED ORDER — ALTEPLASE 2 MG IJ SOLR: 2.0000 mg | Freq: Once | INTRAMUSCULAR | Status: DC | PRN

## 2017-09-02 MED ORDER — NEPRO/CARBSTEADY PO LIQD: 237.0000 mL | Freq: Two times a day (BID) | ORAL | Status: DC

## 2017-09-02 MED ORDER — LEVOFLOXACIN IN D5W 750 MG/150ML IV SOLN
750.0000 mg | Freq: Once | INTRAVENOUS | Status: AC
  Administered 2017-09-02: 750 mg via INTRAVENOUS
  Filled 2017-09-02: qty 150

## 2017-09-02 MED ORDER — DOXERCALCIFEROL 4 MCG/2ML IV SOLN: 3.0000 ug | INTRAVENOUS | Status: DC

## 2017-09-02 MED ORDER — GABAPENTIN 300 MG PO CAPS
300.0000 mg | ORAL_CAPSULE | Freq: Every day | ORAL | Status: DC
  Administered 2017-09-02: 300 mg via ORAL
  Filled 2017-09-02: qty 1

## 2017-09-02 MED ORDER — ASPIRIN 81 MG PO CHEW: 324.0000 mg | CHEWABLE_TABLET | Freq: Once | ORAL | Status: DC

## 2017-09-02 MED ORDER — VANCOMYCIN HCL IN DEXTROSE 1-5 GM/200ML-% IV SOLN
1000.0000 mg | Freq: Once | INTRAVENOUS | Status: DC
  Administered 2017-09-03: 1 g via INTRAVENOUS

## 2017-09-02 NOTE — Progress Notes (Signed)
Pharmacy Antibiotic Note  Rebecca Mullins is a 42 y.o. female admitted on 08/16/2017 with concern for sepsis of unknown origin.  Pharmacy has been consulted for vancomycin and aztreonam dosing.  Plan: Rec'd vanc 2g and aztreonam 2g in ED. Vancomycin 1000 IV every HD.  Goal pre-HD level 15-25 mcg/mL. Aztreonam 500mg  IV every 12 hours.  Height: 5\' 2"  (157.5 cm) Weight: 204 lb 12.9 oz (92.9 kg) IBW/kg (Calculated) : 50.1  Temp (24hrs), Avg:98.4 F (36.9 C), Min:98 F (36.7 C), Max:98.7 F (37.1 C)  Recent Labs  Lab 08/19/2017 2315 08/29/2017 2334 09/02/17 0055  WBC 23.3*  --   --   CREATININE 9.61*  --   --   LATICACIDVEN  --  2.69* 2.54*    Estimated Creatinine Clearance: 8.2 mL/min (A) (by C-G formula based on SCr of 9.61 mg/dL (H)).    Allergies  Allergen Reactions  . Penicillins Anaphylaxis    Has patient had a PCN reaction causing immediate rash, facial/tongue/throat swelling, SOB or lightheadedness with hypotension: Yes Has patient had a PCN reaction causing severe rash involving mucus membranes or skin necrosis: No Has patient had a PCN reaction that required hospitalization No Has patient had a PCN reaction occurring within the last 10 years: No If all of the above answers are "NO", then may proceed with Cephalosporin use.   . Nsaids     Kidney issues  . Oxycodone     Patient says unable to take this medicine.     Thank you for allowing pharmacy to be a part of this patient's care.  Wynona Neat, PharmD, BCPS  09/02/2017 1:46 AM

## 2017-09-02 NOTE — ED Notes (Signed)
Notification sent to Pharmacy for medication.

## 2017-09-02 NOTE — H&P (Addendum)
History and Physical    VIRGENE TIRONE NWG:[id number] DOB: 03/31/1976 DOA: 08/23/2017  Referring MD/NP/PA:   PCP: [name], FNP   Patient coming from:  The patient is coming from home.  At baseline, pt is independent for most of ADL.   Chief Complaint: Palpitation and tachypnea, chest pain, shortness of breath, nausea, vomiting, cough  HPI: Rebecca Mullins is a 42 y.o. female with medical history significant of hypertension, diet-controlled diabetes, GERD, hypothyroidism, scleroderma (following up in [place]), ILD, anemia, interstitial lung disease, PE on Eliquis, ESRD-HD (MWF,  just started in Jan, 2019), who palpitation tachypnea, chest pain, shortness of breath, nausea, vomiting, cough.  Patient states that she started having palpitations and tachypnea since last night, which has been progressively getting worse. She feels like her heart is racing rapidly. She has been breathing rapidly. It is associated with shortness breath and cough. She coughs up yellow colored sputum. She has runny nose, but no sore throat. Denies fever or chills. Patient states that she has chest pain which is located in the frontal chest, pressure-like, nonradiating, mild. It is pleuritic, not aggravated by deep breath. The chest pain does not change with leaning forward. She has nausea and vomited at least 5 times, but no abdominal pain or diarrhea. Patient is still making urine. She feels like having urge to urinate, but no dysuria or burning on urination. No unilateral weakness. Patient states that she has been compliant with Eliqus, did not miss any doses. Patient was initially hypotensive with a blood pressure 86/58 which improved to 101/83 when patient receives antibiotics with IVF. No bolus of fluid were given. Of note, pt had CTA at [facility] on 08/20/17 which showed PE in RLL without CT evidence of right heart straining. She states that her Bp has been running low recently, with SBP 80s to 90s.  ED Course: pt  was found to have WBC 23.3, lactic acid 2.69, 2.54, troponin 0.88, hCG 12, potassium 3.9, bicarbonate 20, creatinine 9.61, BUN 61, tachycardia, tachypnea, oxygen saturation 100% on 2 L nasal cannula oxygen, temperature normal. Chest x-ray showed mild interstitial edema, no infiltration. Patient is admitted to stepdown as inpatient.    Review of Systems:   General: no fevers, chills,  has poor appetite, has fatigue HEENT: no blurry vision, hearing changes or sore throat Respiratory: has dyspnea, coughing, no wheezing CV: has chest pain and palpitations GI: has nausea, vomiting, no abdominal pain, diarrhea, constipation GU: no dysuria, burning on urination, increased urinary frequency, hematuria. Has urge to urinate. Ext: has leg edema Neuro: no unilateral weakness, numbness, or tingling, no vision change or hearing loss Skin: has pressure ulcer in right heel. MSK: No muscle spasm, no deformity, no limitation of range of movement in spin Heme: No easy bruising.  Travel history: No recent long distant travel.  Allergy:  Allergies  Allergen Reactions  . Penicillins Anaphylaxis    Has patient had a PCN reaction causing immediate rash, facial/tongue/throat swelling, SOB or lightheadedness with hypotension: Yes Has patient had a PCN reaction causing severe rash involving mucus membranes or skin necrosis: No Has patient had a PCN reaction that required hospitalization No Has patient had a PCN reaction occurring within the last 10 years: No If all of the above answers are "NO", then may proceed with Cephalosporin use.   . Nsaids     Kidney issues  . Oxycodone     Patient says unable to take this medicine.    Past Medical History:  Diagnosis  Date  . Anemia   . Anxiety   . Bilateral cellulitis of lower leg   . Chronic kidney disease    stage 5  . Dyspnea   . GERD (gastroesophageal reflux disease)   . Glomerulonephritis    [facility] Endo 07/25/2015  . Hypertension    "just starting"  (07/27/2015)  . Hypothyroidism   . Pneumonia   . Poor circulation    Raynaud's disease  . Scleroderma ([place])    "fingers" (07/27/2015)  . Strep throat 1977; winter 2016  . Type II diabetes mellitus ([place]) dx'd 09/2014    Past Surgical History:  Procedure Laterality Date  . INSERTION OF DIALYSIS CATHETER Right 07/17/2017   Procedure: INSERTION OF Right Internal Jugular DIALYSIS  CATHETER;  Surgeon: Rosetta Posner, MD;  Location: [facility];  Service: Vascular;  Laterality: Right;  . RENAL BIOPSY Left 07/27/2015   US guided medical renal biopsy    Social History:  reports that she quit smoking about 22 years ago. Her smoking use included cigarettes. She started smoking about 24 years ago. She has a 2.00 pack-year smoking history. she has never used smokeless tobacco. She reports that she drinks alcohol. She reports that she does not use drugs.  Family History:  Family History  Problem Relation Age of Onset  . Diabetes Mother   . Cancer Mother 3       breast  . Hyperlipidemia Father   . Diabetes Brother      Prior to Admission medications   Medication Sig Start Date End Date Taking? Authorizing Provider  apixaban (ELIQUIS) 5 MG TABS tablet Take 5 mg by mouth 2 (two) times daily. 08/28/17 09/11/17 Yes [provider]  gabapentin (NEURONTIN) 600 MG tablet Take 0.5 tablets (300 mg total) by mouth daily. 07/21/17  Yes Kayleen Memos, DO  levothyroxine (SYNTHROID, LEVOTHROID) 88 MCG tablet Take 88 mcg by mouth daily.   Yes [provider]  medroxyPROGESTERone (DEPO-PROVERA) 150 MG/ML injection INJECT 1 ML (150 MG TOTAL) INTO THE MUSCLE EVERY 3 MONTHS 03/05/16  Yes English, Stephanie D, PA  mycophenolate (CELLCEPT) 500 MG tablet Take 1,500 mg by mouth every 12 (twelve) hours.    Yes [provider]  predniSONE (DELTASONE) 5 MG tablet Take 10 mg by mouth daily. 08/15/17 11/13/17 Yes [provider]  Vitamin D, Ergocalciferol, (DRISDOL) 50000 units CAPS capsule Take 50,000  Units by mouth every 7 (seven) days. On Friday   Yes [provider]    Physical Exam: Vitals:   09/02/17 0020 09/02/17 0100 09/02/17 0230 09/02/17 0300  BP:  104/83 105/86 103/82  Pulse: (!) 119 (!) 117 (!) 113 (!) 110  Resp: (!) 28 (!) 26 (!) 25 (!) 27  Temp:      TempSrc:      SpO2: 100% 100% 100% 100%  Weight:      Height:       General: Not in acute distress HEENT:       Eyes: PERRL, EOMI, no scleral icterus.       ENT: No discharge from the ears and nose, no pharynx injection, no tonsillar enlargement.        Neck: No JVD, no bruit, no mass felt. Heme: No neck lymph node enlargement. Cardiac: S1/S2, RRR, No murmurs, No gallops or rubs. Respiratory: No rales, wheezing, rhonchi or rubs. GI: Soft, nondistended, nontender, no rebound pain, no organomegaly, BS present. GU: No hematuria Ext: has 1+ pitting leg edema bilaterally. 2+DP/PT pulse bilaterally. Musculoskeletal: No joint deformities,  No joint redness or warmth, no limitation of ROM in spin. Skin: has pressure ulcer in right heel. Neuro: Alert, oriented X3, cranial nerves II-XII grossly intact, moves all extremities normally. Psych: Patient is not psychotic, no suicidal or hemocidal ideation.  Labs on Admission: I have personally reviewed following labs and imaging studies  CBC: Recent Labs  Lab 08/24/2017 2315  WBC 23.3*  NEUTROABS 19.9*  HGB 11.9*  HCT 37.7  MCV 89.5  PLT 716   Basic Metabolic Panel: Recent Labs  Lab 08/17/2017 2315 09/02/17 0222  NA 138 135  K 3.9 5.2*  CL 98* 99*  CO2 20* 17*  GLUCOSE 153* 150*  BUN 61* 61*  CREATININE 9.61* 9.62*  CALCIUM 9.4 8.8*   GFR: Estimated Creatinine Clearance: 8.2 mL/min (A) (by C-G formula based on SCr of 9.62 mg/dL (H)). Liver Function Tests: Recent Labs  Lab 08/24/2017 2315 09/02/17 0222  AST 41 71*  ALT 28 34  ALKPHOS 174* 165*  BILITOT 0.5 1.2  PROT 8.4* 7.3  ALBUMIN 3.7 3.3*   Recent Labs  Lab 09/02/17 0222  LIPASE 57*   No  results for input(s): AMMONIA in the last 168 hours. Coagulation Profile: Recent Labs  Lab 09/02/17 0222  INR 1.24   Cardiac Enzymes: Recent Labs  Lab 09/02/17 0222  TROPONINI 4.78*   BNP (last 3 results) No results for input(s): PROBNP in the last 8760 hours. HbA1C: No results for input(s): HGBA1C in the last 72 hours. CBG: No results for input(s): GLUCAP in the last 168 hours. Lipid Profile: Recent Labs    09/02/17 0222  CHOL 268*  HDL 32*  LDLCALC 177*  TRIG 296*  CHOLHDL 8.4   Thyroid Function Tests: No results for input(s): TSH, T4TOTAL, FREET4, T3FREE, THYROIDAB in the last 72 hours. Anemia Panel: No results for input(s): VITAMINB12, FOLATE, FERRITIN, TIBC, IRON, RETICCTPCT in the last 72 hours. Urine analysis:    Component Value Date/Time   COLORURINE YELLOW 09/02/2017 0133   APPEARANCEUR HAZY (A) 09/02/2017 0133   LABSPEC 1.011 09/02/2017 0133   PHURINE 7.0 09/02/2017 0133   GLUCOSEU 50 (A) 09/02/2017 0133   HGBUR LARGE (A) 09/02/2017 0133   BILIRUBINUR NEGATIVE 09/02/2017 0133   BILIRUBINUR negative 04/21/2015 1823   BILIRUBINUR neg 03/20/2015 1421   KETONESUR NEGATIVE 09/02/2017 0133   PROTEINUR 100 (A) 09/02/2017 0133   UROBILINOGEN 0.2 04/21/2015 1823   UROBILINOGEN 0.2 01/13/2015 1800   NITRITE NEGATIVE 09/02/2017 0133   LEUKOCYTESUR MODERATE (A) 09/02/2017 0133   Sepsis Labs: @LABRCNTIP (procalcitonin:4,lacticidven:4) ) Recent Results (from the past 240 hour(s))  Blood culture (routine x 2)     Status: None (Preliminary result)   Collection Time: 09/02/17 12:17 AM  Result Value Ref Range Status   Specimen Description   Final    BLOOD LEFT ANTECUBITAL Performed at [facility], [place] [address], [place], [place] [address]    Special Requests   Final    BOTTLES DRAWN AEROBIC AND ANAEROBIC Blood Culture adequate volume   Culture PENDING  Incomplete   Report Status PENDING  Incomplete  Blood culture (routine x 2)     Status: None  (Preliminary result)   Collection Time: 09/02/17 12:47 AM  Result Value Ref Range Status   Specimen Description   Final    BLOOD RIGHT HAND Performed at [facility], [place] [address] [place], [place] [address]    Special Requests IN PEDIATRIC BOTTLE Blood Culture adequate volume  Final   Culture PENDING  Incomplete   Report Status PENDING  Incomplete     Radiological Exams on Admission: Dg Chest 2 View  Result Date: 09/02/2017 CLINICAL DATA:  42 year old female with tachycardia and palpitation for EXAM: CHEST  2 VIEW COMPARISON:  Chest radiograph dated 07/17/2017 FINDINGS: Right-sided dialysis catheter with tip over the right atrium. There is mild interstitial edema similar or slightly worsened compared the prior radiograph. No focal consolidation, pleural effusion, or pneumothorax. The cardiac silhouette is within normal limits with no acute osseous pathology. IMPRESSION: 1. Mild interstitial edema.  No focal consolidation. 2. Right-sided dialysis catheter in stable position with tip in the right atrium. Electronically Signed   By: Anner Crete M.D.   On: 08/17/2017 22:55     EKG: Independently reviewed. Sinus rhythm, QTC 450, ST depression in V4-V6   Assessment/Plan Principal Problem:   Chest pain Active Problems:   Dyspnea and respiratory abnormality   ILD (interstitial lung disease) (HCC)   Scleroderma (HCC)   ESRD (end stage renal disease) (HCC)   Pressure ulcer   PE (pulmonary thromboembolism) (HCC)   Hypothyroidism   Sepsis (HCC)   Elevated troponin   Nausea & vomiting   UTI (urinary tract infection)   Chest pain and elevated trop: Etiology is not clear. Trop 0.88. Differential diagnosis ACS, pericarditis and new PE. Her elevated trop is at least partially related to ESRD. Pt was given one dose of Lovenox by EDP due to the concerning for new PE.   - will admit to SDU as inpt - will admit to tele bed as inpt - cycle CE q6 x3 and repeat EKG in the am  - prn  Morphine, and aspirin - will not give Nitroglycerin due to hypotension - Risk factor stratification: will check FLP, UDS and A1C  - 2d echo - Inpatient non-urgent card consult order was put in Epic and message to [employer] was sent out.  Addendum: the repeated trop is upto 4.78. -Dr. Raiford Simmonds of card was consuted at 4:50 AM. -will D/c Eliquis, no more Lovenox, start IV heparin drip per pharmacy as recommended by Dr. Raiford Simmonds  Addendum: UA positive for UTI -on vanco and aztreonam -f/u Bx and Ux.  Adendum: repeated BMP showed K=5.2. -give 15 g of Kayexalate  Dyspnea and respiratory abnormality and ILD: pt has worsening SOB initiall, which has improved. Etiology is not clear. This is likely multifactorial etiology, including interstitial lung disease, mild interstitial edema. The differential diagnosis would include new PE. Lung's auscultation is clear. -Xopenex nebs prn -prn mucinex for cough -f/u Flu pcr and sputum culture -May consider to get V/Q scan  Scleroderma [facility]): f/u in [place] -continue cellcept and prednisione 10 mg daily -give 100 mg of Solu-Cortef as stress dose -check cortisol level  Sepsis vs. SIRS: Patient meets criteria for sepsis with leukocytosis, tachycardia, tachypnea and hypotension. Lactic acid is elevated at 2.69, 2.54. Source of infection is not identified. No infiltrate on chest x-ray. Pt may have viral gastritis given nausea vomiting. Bp has improved, currently SBP at 90s. No bolus of fluid was given due to ESRD.  -pt was given vancomycin, Levaquin and aztreonam--> will d/c Levaquin, continue vancomycin and aztreonam -f/u Bx, UA, and Ux -will get Procalcitonin and trend lactic acid levels per sepsis protocol. -IVF: NS at 50 cc/h   Nausea & vomiting: Etiology is not clear. No abdominal pain or diarrhea. May be due to viral gastritis. -Check lipase -When necessary Zofran for nausea and vomiting -IV fluid as above  Hypothyroidism: Last  TSH was on 4.613 on  01/19/13 -Continue home Synthroid  ESRD (end stage renal disease) (MWF): potassium 3.9, bicarbonate 20, creatinine 9.61, BUN 61, -Left message to renal box for HD  PE (pulmonary thromboembolism) ([place]):  -continue Eliquis -given one dose of lovenox by EDP due to concerning for new PE   Pressure ulcer in right heel: -wound care consult  DVT ppx: on Eliquis Code Status: Full code Family Communication: Yes, patient's  Mother and fianc    at bed side Disposition Plan:  Anticipate discharge back to previous home environment Consults called:  none Admission status:  SDU/inpation       Date of Service 09/02/2017    Ivor Costa [facility] Pager [phone]  If 7PM-7AM, please contact night-coverage [email or web address] Password [facility] 09/02/2017, 4:46 AM

## 2017-09-02 NOTE — Consult Note (Addendum)
[facility] Nurse wound consult note Pt is familiar to [facility] from recent admission; refer to progress notes on 1/11. Reason for Consult: Consult requested for right heel wound.  Pt states this is chronic and she is followed by the outpatient wound care center at [facility] and the wound has serial sharp debridements performed. She has scleroderma and area is very painful to touch.  Generalized edema to foot.They have ordered a hydrocolloid dressing be applied and changed twice a week or PRN when soiled. Wound type: Chronic full thickness wound to posterior plantar heel is larger  than previous admission. Measurement: 4X.8X.3cm Wound bed: 60% red and moist, 40% pale yellow wound bed Drainage (amount, consistency, odor) small amt yellow drainage, no odor  Periwound: intact skin surrounding Dressing procedure/placement/frequency: Continue present plan of care with hydrocolloid to protect from further injury. Pt states she uses Trimicilone lotion to legs daily; please order if desired. She states she will follow-up with the outpatient wound care center after discharge.  Please re-consult if further assistance is needed.  Thank-you,  Julien Girt MSN, [place], [place], [place], [place]

## 2017-09-02 NOTE — ED Notes (Signed)
RT at bedside for blood gas draw

## 2017-09-02 NOTE — Consult Note (Addendum)
History & Physical    Patient ID: Rebecca Mullins MRN: [id number], DOB/AGE: 1975/12/05   Admit date: 08/30/2017   Primary Physician: [name], FNP Primary Cardiologist: No primary care provider on file.  Patient Profile    His pain and shortness of breath in a patient with scleroderma  Past Medical History    Past Medical History:  Diagnosis Date  . Anemia   . Anxiety   . Bilateral cellulitis of lower leg   . Chronic kidney disease    stage 5  . Dyspnea   . GERD (gastroesophageal reflux disease)   . Glomerulonephritis    Archie Endo 07/25/2015  . Hypertension    "just starting" (07/27/2015)  . Hypothyroidism   . Pneumonia   . Poor circulation    Raynaud's disease  . Scleroderma ([place])    "fingers" (07/27/2015)  . Strep throat 1977; winter 2016  . Type II diabetes mellitus ([place]) dx'd 09/2014    Past Surgical History:  Procedure Laterality Date  . INSERTION OF DIALYSIS CATHETER Right 07/17/2017   Procedure: INSERTION OF Right Internal Jugular DIALYSIS  CATHETER;  Surgeon: Rosetta Posner, MD;  Location: [facility];  Service: Vascular;  Laterality: Right;  . RENAL BIOPSY Left 07/27/2015   US guided medical renal biopsy     Allergies  Allergies  Allergen Reactions  . Penicillins Anaphylaxis    Has patient had a PCN reaction causing immediate rash, facial/tongue/throat swelling, SOB or lightheadedness with hypotension: Yes Has patient had a PCN reaction causing severe rash involving mucus membranes or skin necrosis: No Has patient had a PCN reaction that required hospitalization No Has patient had a PCN reaction occurring within the last 10 years: No If all of the above answers are "NO", then may proceed with Cephalosporin use.   . Nsaids     Kidney issues  . Oxycodone     Patient says unable to take this medicine.    History of Present Illness    A 42 year old woman with a past medical history of hypertension, diabetes, GERD, scleroderma, end-stage renal disease  and the recent PE diagnosed at [facility] on February 12.  Now presents with acute onset of shortness of breath and chest pain this afternoon.  She was driving home in the car when she noted to be short of breath developed some chest pain.  She got out of the car vomited.  She also reports palpitations which are new.  The pain is retrosternal radiating to the left arm.  She feels that the pain is not getting worse or better with any positional change.  She has some shortness of breath laying flat.  She has some lower extremity edema.  She reports that her right leg is little bit more swollen than the left.  Last hemodialysis course was on Friday.  She is on hemodialysis for now nearly 2 months.  She reports that in the urine.  No cough, no fever chills or night sweats.  Blood pressure at home runs in the low 13K-44W systolic.  Review of her chart it looks like she complained about palpitations few days ago and was actually started on a Holter monitor at [facility].  She seen Dr. Danise Mina at [facility] for her PAD and lower extremity wounds  Home Medications    Prior to Admission medications   Medication Sig Start Date End Date Taking? Authorizing Provider  apixaban (ELIQUIS) 5 MG TABS tablet Take 5 mg by mouth 2 (two) times daily. 08/28/17 09/11/17 Yes  [provider]  gabapentin (NEURONTIN) 600 MG tablet Take 0.5 tablets (300 mg total) by mouth daily. 07/21/17  Yes Kayleen Memos, DO  levothyroxine (SYNTHROID, LEVOTHROID) 88 MCG tablet Take 88 mcg by mouth daily.   Yes [provider]  medroxyPROGESTERone (DEPO-PROVERA) 150 MG/ML injection INJECT 1 ML (150 MG TOTAL) INTO THE MUSCLE EVERY 3 MONTHS 03/05/16  Yes English, Stephanie D, PA  mycophenolate (CELLCEPT) 500 MG tablet Take 1,500 mg by mouth every 12 (twelve) hours.    Yes [provider]  predniSONE (DELTASONE) 5 MG tablet Take 10 mg by mouth daily. 08/15/17 11/13/17 Yes [provider]  Vitamin D, Ergocalciferol, (DRISDOL) 50000  units CAPS capsule Take 50,000 Units by mouth every 7 (seven) days. On Friday   Yes [provider]    Family History    Family History  Problem Relation Age of Onset  . Diabetes Mother   . Cancer Mother 76       breast  . Hyperlipidemia Father   . Diabetes Brother    indicated that her mother is alive. She indicated that her father is alive. She indicated that the status of her brother is unknown. She indicated that her maternal grandmother is deceased. She indicated that her maternal grandfather is deceased. She indicated that her paternal grandmother is deceased. She indicated that her paternal grandfather is deceased.   Social History    Social History   Socioeconomic History  . Marital status: Single    Spouse name: Not on file  . Number of children: 0  . Years of education: Not on file  . Highest education level: Not on file  Social Needs  . Financial resource strain: Not on file  . Food insecurity - worry: Not on file  . Food insecurity - inability: Not on file  . Transportation needs - medical: Not on file  . Transportation needs - non-medical: Not on file  Occupational History  . Occupation: Disabled.  Tobacco Use  . Smoking status: Former Smoker    Packs/day: 1.00    Years: 2.00    Pack years: 2.00    Types: Cigarettes    Start date: 02/28/1993    Last attempt to quit: 03/01/1995    Years since quitting: 22.5  . Smokeless tobacco: Never Used  Substance and Sexual Activity  . Alcohol use: Yes    Comment: 07/27/2015 "might have a couple glasses of beer or wine q couple weeks"  . Drug use: No  . Sexual activity: Yes    Birth control/protection: Injection  Other Topics Concern  . Not on file  Social History Narrative   Single. Lives alone. Disabled.     Review of Systems    General:  No chills, fever, night sweats or weight changes.  Cardiovascular:  No chest pain, dyspnea on exertion, edema, orthopnea, palpitations, paroxysmal nocturnal  dyspnea. Dermatological: No rash, lesions/masses Respiratory: No cough, dyspnea Urologic: No hematuria, dysuria Abdominal:   No nausea, vomiting, diarrhea, bright red blood per rectum, melena, or hematemesis Neurologic:  No visual changes, wkns, changes in mental status. All other systems reviewed and are otherwise negative except as noted above.  Physical Exam    Blood pressure 103/82, pulse (!) 110, temperature 98.7 F (37.1 C), temperature source Rectal, resp. rate (!) 27, height 5\' 2"  (1.575 m), weight 92.9 kg (204 lb 12.9 oz), SpO2 100 %, unknown if currently breastfeeding.  General: Pleasant, NAD Psych: Normal affect. Neuro: Alert and oriented X 3. Moves all  extremities spontaneously. HEENT: Normal  Neck: Supple without bruits or JVD. Lungs:  Resp regular and unlabored, CTA. Heart: RRR no s3, s4, or murmurs. Abdomen: Soft, non-tender, non-distended, BS + x 4.  Extremities: No clubbing, cyanosis or edema. DP/PT/Radials 2+ and equal bilaterally.  Labs    Troponin [facility] of Care Test) Recent Labs    08/29/2017 2331  TROPIPOC 0.88*   Recent Labs    09/02/17 0222  TROPONINI 4.78*   Lab Results  Component Value Date   WBC 23.3 (H) 08/12/2017   HGB 11.9 (L) 09/02/2017   HCT 37.7 08/26/2017   MCV 89.5 08/18/2017   PLT 321 08/13/2017    Recent Labs  Lab 09/02/17 0222  NA 135  K 5.2*  CL 99*  CO2 17*  BUN 61*  CREATININE 9.62*  CALCIUM 8.8*  PROT 7.3  BILITOT 1.2  ALKPHOS 165*  ALT 34  AST 71*  GLUCOSE 150*   Lab Results  Component Value Date   CHOL 268 (H) 09/02/2017   HDL 32 (L) 09/02/2017   LDLCALC 177 (H) 09/02/2017   TRIG 296 (H) 09/02/2017   Lab Results  Component Value Date   DDIMER <0.27 03/31/2012     Radiology Studies    Dg Chest 2 View  Result Date: 08/17/2017 CLINICAL DATA:  42 year old female with tachycardia and palpitation for EXAM: CHEST  2 VIEW COMPARISON:  Chest radiograph dated 07/17/2017 FINDINGS: Right-sided dialysis catheter  with tip over the right atrium. There is mild interstitial edema similar or slightly worsened compared the prior radiograph. No focal consolidation, pleural effusion, or pneumothorax. The cardiac silhouette is within normal limits with no acute osseous pathology. IMPRESSION: 1. Mild interstitial edema.  No focal consolidation. 2. Right-sided dialysis catheter in stable position with tip in the right atrium. Electronically Signed   By: Anner Crete M.D.   On: 09/05/2017 22:55    ECG & Cardiac Imaging    Sinus tach with depressions in lateral leads Assessment & Plan    Chest pain: It appears that Ms. Scheidegger has an end NSTEMI.  Her troponin is increasing from 0.8 to >4.  ECG shows ischemic changes.  Patient is currently asymptomatic chest pain standpoint but still feels palpitations.  Ms. Hon vitals are abnormal with a heart rate greater than 100 and a blood pressure below 90 systolic.  Her lactate is mildly elevated.  She is grossly fluid overloaded on exam.  On apixaban for recent PE but also given a Lovenox shot by the ED provider.  Recommendations: -Close hemodynamic monitoring.   -Repeat ECGs.   -Keep n.p.o..  -We will consider left heart cath in the morning.  Hold apixaban.  Hold Lovenox.  Prefer heparin going forward but given exposure to apixaban and Lovenox would hold off heparin at this time point. -TTE in the morning  SIRS: Possible ongoing infection with an elevated white count, abnormal vitals, elevated lactate.  This could be an early Sirs/sepsis/cardiac shock with hard to tell at this time point.  Likely UTI. Would recommend close monitoring with potential transfer to an intensive care unit.  Broad antibiotic coverage has been initiated by the emergency room.  Signed, Cristina Gong, MD 09/02/2017, 5:11 AM

## 2017-09-02 NOTE — Progress Notes (Signed)
PROGRESS NOTE    Rebecca Mullins  PFX:[id number] DOB: 05-30-1976 DOA: 08/20/2017 PCP: [name], FNP   Brief Narrative:  HPI on 09/02/2017 by Dr. Mickey Farber Rebecca Mullins is a 42 y.o. female with medical history significant of hypertension, diet-controlled diabetes, GERD, hypothyroidism, scleroderma (following up in [place]), ILD, anemia, interstitial lung disease, PE on Eliquis, ESRD-HD (MWF,  just started in Jan, 2019), who palpitation tachypnea, chest pain, shortness of breath, nausea, vomiting, cough.  Patient states that she started having palpitations and tachypnea since last night, which has been progressively getting worse. She feels like her heart is racing rapidly. She has been breathing rapidly. It is associated with shortness breath and cough. She coughs up yellow colored sputum. She has runny nose, but no sore throat. Denies fever or chills. Patient states that she has chest pain which is located in the frontal chest, pressure-like, nonradiating, mild. It is pleuritic, not aggravated by deep breath. The chest pain does not change with leaning forward. She has nausea and vomited at least 5 times, but no abdominal pain or diarrhea. Patient is still making urine. She feels like having urge to urinate, but no dysuria or burning on urination. No unilateral weakness. Patient states that she has been compliant with Eliqus, did not miss any doses. Patient was initially hypotensive with a blood pressure 86/58 which improved to 101/83 when patient receives antibiotics with IVF. No bolus of fluid were given. Of note, pt had CTA at [facility] on 08/20/17 which showed PE in RLL without CT evidence of right heart straining. She states that her Bp has been running low recently, with SBP 80s to 90s.  Interim history Patient admitted for chest pain, found to have elevated troponin- cardiology consulted.  Patient also with end-stage renal disease, nephrology consulted. Assessment & Plan   Chest  pain/NSTEMI -Currently denies further chest pain -EKG showed new T wave inversions in the anterior lateral leads -Troponin being cycled, currently 30.34 -Patient has had a history of PE however was on Eliquis prior to admission and currently on heparin -Echocardiogram pending  -Cardiology consulted and appreciated, pending further recommendations  Sepsis secondary to UTI -Presented with leukocytosis, tachycardia, tachypnea and hypotension, also with elevated lactic acid level -Chest x-ray reviewed, unremarkable for infection  -UA: Moderate leukocytes, few bacteria, TNTC WBC -Patient was given vancomycin, Levaquin, aztreonam in the emergency department -Currently on vancomycin and AcipHex TM -Blood cultures and urine culture pending  Dyspnea and respiratory insufficiency/ILD -Patient presented with worsening shortness of breath -Chest x-ray does show some interstitial edema -Influenza PCR negative  Scleroderma -Continue CellCept, prednisone -Follows with rheumatologist at [facility]  End-stage renal disease -Nephrology consulted and appreciated -Patient recently started dialysis in January 2019 -Dialyzes Monday, Wednesday, Friday  Nausea and vomiting  -Possibly related to chest pain versus sepsis -Continue antiemetics -Currently denies any abdominal pain -Lipase minimally elevated, currently 57  Hypothyroidism -Continue Synthroid  Pulmonary embolism -Recently diagnosed, was on Eliquis -Patient currently on heparin drip  Right heel pressure ulcer -Wound care consulted  Hypokalemia -K5.2, Kayexalate ordered -Continue to monitor BMP  DVT Prophylaxis  heparin   Code Status: Full  Family Communication: Mother at bedside  Disposition Plan: Admitted, pending further recommendations from cardiology  Consultants Cardiology Nephrology  Procedures  None  Antibiotics   Anti-infectives (From admission, onward)   Start     Dose/Rate Route Frequency Ordered Stop    09/02/17 1200  vancomycin (VANCOCIN) IVPB 1000 mg/200 mL premix     1,000  mg 200 mL/hr over 60 Minutes Intravenous Every M-W-F (Hemodialysis) 09/02/17 0150     09/02/17 1200  aztreonam (AZACTAM) 500 mg in dextrose 5 % 50 mL IVPB     500 mg 100 mL/hr over 30 Minutes Intravenous Every 12 hours 09/02/17 0150     09/02/17 0030  levofloxacin (LEVAQUIN) IVPB 750 mg     750 mg 100 mL/hr over 90 Minutes Intravenous  Once 09/02/17 0019 09/02/17 0158   09/02/17 0030  aztreonam (AZACTAM) 2 g in sodium chloride 0.9 % 100 mL IVPB     2 g 200 mL/hr over 30 Minutes Intravenous  Once 09/02/17 0019 09/02/17 0058   09/02/17 0030  vancomycin (VANCOCIN) IVPB 1000 mg/200 mL premix  Status:  Discontinued     1,000 mg 200 mL/hr over 60 Minutes Intravenous  Once 09/02/17 0019 09/02/17 0022   09/02/17 0030  vancomycin (VANCOCIN) 2,000 mg in sodium chloride 0.9 % 500 mL IVPB     2,000 mg 250 mL/hr over 120 Minutes Intravenous  Once 09/02/17 0022 09/02/17 0239      Subjective:   Rebecca Mullins seen and examined today.  No longer having chest pain but does feel some shortness of breath and has palpitations occasionally.  Denies current abdominal pain.  Currently denies mild nausea and vomiting.  Denies dizziness or headache.  Is worried.  Objective:   Vitals:   09/02/17 0950 09/02/17 1000 09/02/17 1015 09/02/17 1204  BP:  103/89  118/89  Pulse: (!) 103 (!) 104 (!) 103   Resp: (!) 24 (!) 22 (!) 21 15  Temp:      TempSrc:      SpO2: 99%  100% 100%  Weight:      Height:        Intake/Output Summary (Last 24 hours) at 09/02/2017 1307 Last data filed at 09/02/2017 0239 Gross per 24 hour  Intake 750 ml  Output -  Net 750 ml   Filed Weights   09/02/17 0000  Weight: 92.9 kg (204 lb 12.9 oz)    Exam  General: Well developed, well nourished, NAD, appears stated age  58: NCAT, mucous membranes moist.   Neck: Supple  Cardiovascular: S1 S2 auscultated, no rubs, murmurs or gallops. Tachycardic    Respiratory: Clear to auscultation bilaterally with equal chest rise  Abdomen: Soft, nontender, nondistended, + bowel sounds  Extremities: warm dry without cyanosis clubbing. +LE edema  Neuro: AAOx3, Nonfocal   Psych: Normal affect and demeanor   Data Reviewed: I have personally reviewed following labs and imaging studies  CBC: Recent Labs  Lab 08/25/2017 2315  WBC 23.3*  NEUTROABS 19.9*  HGB 11.9*  HCT 37.7  MCV 89.5  PLT 242   Basic Metabolic Panel: Recent Labs  Lab 08/27/2017 2315 09/02/17 0222  NA 138 135  K 3.9 5.2*  CL 98* 99*  CO2 20* 17*  GLUCOSE 153* 150*  BUN 61* 61*  CREATININE 9.61* 9.62*  CALCIUM 9.4 8.8*   GFR: Estimated Creatinine Clearance: 8.2 mL/min (A) (by C-G formula based on SCr of 9.62 mg/dL (H)). Liver Function Tests: Recent Labs  Lab 08/22/2017 2315 09/02/17 0222  AST 41 71*  ALT 28 34  ALKPHOS 174* 165*  BILITOT 0.5 1.2  PROT 8.4* 7.3  ALBUMIN 3.7 3.3*   Recent Labs  Lab 09/02/17 0222  LIPASE 57*   No results for input(s): AMMONIA in the last 168 hours. Coagulation Profile: Recent Labs  Lab 09/02/17 0222  INR 1.24   Cardiac Enzymes:  Recent Labs  Lab 09/02/17 0222 09/02/17 0857  TROPONINI 4.78* 30.34*   BNP (last 3 results) No results for input(s): PROBNP in the last 8760 hours. HbA1C: No results for input(s): HGBA1C in the last 72 hours. CBG: No results for input(s): GLUCAP in the last 168 hours. Lipid Profile: Recent Labs    09/02/17 0222  CHOL 268*  HDL 32*  LDLCALC 177*  TRIG 296*  CHOLHDL 8.4   Thyroid Function Tests: No results for input(s): TSH, T4TOTAL, FREET4, T3FREE, THYROIDAB in the last 72 hours. Anemia Panel: No results for input(s): VITAMINB12, FOLATE, FERRITIN, TIBC, IRON, RETICCTPCT in the last 72 hours. Urine analysis:    Component Value Date/Time   COLORURINE YELLOW 09/02/2017 0133   APPEARANCEUR HAZY (A) 09/02/2017 0133   LABSPEC 1.011 09/02/2017 0133   PHURINE 7.0 09/02/2017 0133    GLUCOSEU 50 (A) 09/02/2017 0133   HGBUR LARGE (A) 09/02/2017 0133   BILIRUBINUR NEGATIVE 09/02/2017 0133   BILIRUBINUR negative 04/21/2015 1823   BILIRUBINUR neg 03/20/2015 1421   KETONESUR NEGATIVE 09/02/2017 0133   PROTEINUR 100 (A) 09/02/2017 0133   UROBILINOGEN 0.2 04/21/2015 1823   UROBILINOGEN 0.2 01/13/2015 1800   NITRITE NEGATIVE 09/02/2017 0133   LEUKOCYTESUR MODERATE (A) 09/02/2017 0133   Sepsis Labs: @LABRCNTIP (procalcitonin:4,lacticidven:4)  ) Recent Results (from the past 240 hour(s))  Blood culture (routine x 2)     Status: None (Preliminary result)   Collection Time: 09/02/17 12:17 AM  Result Value Ref Range Status   Specimen Description   Final    BLOOD LEFT ANTECUBITAL Performed at [facility], [place] [address], [place], [place] [address]    Special Requests   Final    BOTTLES DRAWN AEROBIC AND ANAEROBIC Blood Culture adequate volume   Culture PENDING  Incomplete   Report Status PENDING  Incomplete  Blood culture (routine x 2)     Status: None (Preliminary result)   Collection Time: 09/02/17 12:47 AM  Result Value Ref Range Status   Specimen Description   Final    BLOOD RIGHT HAND Performed at [facility], [place] [address] [place], [place] [address]    Special Requests IN PEDIATRIC BOTTLE Blood Culture adequate volume  Final   Culture PENDING  Incomplete   Report Status PENDING  Incomplete      Radiology Studies: Dg Chest 2 View  Result Date: 08/18/2017 CLINICAL DATA:  42 year old female with tachycardia and palpitation for EXAM: CHEST  2 VIEW COMPARISON:  Chest radiograph dated 07/17/2017 FINDINGS: Right-sided dialysis catheter with tip over the right atrium. There is mild interstitial edema similar or slightly worsened compared the prior radiograph. No focal consolidation, pleural effusion, or pneumothorax. The cardiac silhouette is within normal limits with no acute osseous pathology. IMPRESSION: 1. Mild interstitial edema.  No focal  consolidation. 2. Right-sided dialysis catheter in stable position with tip in the right atrium. Electronically Signed   By: Anner Crete M.D.   On: 08/13/2017 22:55     Scheduled Meds: . [START ON Sep 04, 2017] aspirin  324 mg Oral Once  . gabapentin  300 mg Oral Daily  . levalbuterol  1.25 mg Nebulization Q6H  . levothyroxine  88 mcg Oral QAC breakfast  . mycophenolate  1,500 mg Oral Q12H  . predniSONE  10 mg Oral Daily  . sodium polystyrene  15 g Oral Once   Continuous Infusions: . sodium chloride 50 mL/hr at 09/02/17 0207  . aztreonam 500 mg (09/02/17 1210)  . heparin    .  vancomycin       LOS: 0 days   Time Spent in minutes   30 minutes  Alyanah Elliott D.O. on 09/02/2017 at 1:07 PM  Between 7am to 7pm - Pager - [phone]  After 7pm go to [email or web address] - password [email or web address]  And look for the night coverage person covering for me after hours  [facility] Office  [phone]

## 2017-09-02 NOTE — Consult Note (Signed)
[facility] Renal Consultation Note    Indication for Consultation:  Management of ESRD/hemodialysis; anemia, hypertension/volume and secondary hyperparathyroidism  ONG:[id number], Rebecca Gladden, FNP  HPI: Rebecca Mullins is a 42 y.o. female. 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  Past medical history significant for HTN, DMT2, scleroderma (followed by [facility]), GERD, hypothyroidism, ILD, and recent PE in [place] on Eliquis.  She was admitted for further management of NSTEMI.  Patient presented to the ED 1 day ago with complaints of CP and palpitations.  Per patient she became nauseated on her way home and vomited x2.  After showering she became diaphoretic, SOB, felt like her heart was racing and her chest felt heavy, so they called 911.  Currently she reports intermittent CP and tachycardia.  Denies fever, chills, dysuria, abdominal pain, n/v/Mullins, dizziness and weakness.  Pertinent findings include tachycardia, troponin 30.34, K 5.4, WBC 23.3, lactic acid 2.1, +UA, and EKG showing new T wave inversions.    Patient is new to HD, but has been complaint to prescribed treatment.  Her last HD was on 2/22 and she did not quite meet her EDW.  Of note, per vascular surgery she is not a candidate for permanent access placement due to history of autoamputation of digits and poor perfusion to her extremities, therefore receives HD via L IJ TDC.   Past Medical History:  Diagnosis Date  . Anemia   . Anxiety   . Bilateral cellulitis of lower leg   . Chronic kidney disease    stage 5  . Dyspnea   . GERD (gastroesophageal reflux disease)   . Glomerulonephritis    Rebecca Mullins 07/25/2015  . Hypertension    "just starting" (07/27/2015)  . Hypothyroidism   . Pneumonia   . Poor circulation    Raynaud's disease  . Scleroderma ([place])    "fingers" (07/27/2015)  . Strep throat 1977; winter 2016  . Type II diabetes mellitus ([place]) dx'Mullins 09/2014   Past Surgical History:   Procedure Laterality Date  . INSERTION OF DIALYSIS CATHETER Right 07/17/2017   Procedure: INSERTION OF Right Internal Jugular DIALYSIS  CATHETER;  Surgeon: Rebecca Posner, Mullins;  Location: [facility];  Service: Vascular;  Laterality: Right;  . RENAL BIOPSY Left 07/27/2015   US guided medical renal biopsy   Family History  Problem Relation Age of Onset  . Diabetes Mother   . Cancer Mother 46       breast  . Hyperlipidemia Father   . Diabetes Brother    Social History:  reports that she quit smoking about 22 years ago. Her smoking use included cigarettes. She started smoking about 24 years ago. She has a 2.00 pack-year smoking history. she has never used smokeless tobacco. She reports that she drinks alcohol. She reports that she does not use drugs. Allergies  Allergen Reactions  . Penicillins Anaphylaxis    Has patient had a PCN reaction causing immediate rash, facial/tongue/throat swelling, SOB or lightheadedness with hypotension: Yes Has patient had a PCN reaction causing severe rash involving mucus membranes or skin necrosis: No Has patient had a PCN reaction that required hospitalization No Has patient had a PCN reaction occurring within the last 10 years: No If all of the above answers are "NO", then may proceed with Cephalosporin use.   . Nsaids     Kidney issues  . Oxycodone     Patient says unable to take this medicine.   Prior to Admission medications  Medication Sig Start Date End Date Taking? Authorizing Provider  apixaban (ELIQUIS) 5 MG TABS tablet Take 5 mg by mouth 2 (two) times daily. 08/28/17 09/11/17 Yes Rebecca Mullins  gabapentin (NEURONTIN) 600 MG tablet Take 0.5 tablets (300 mg total) by mouth daily. 07/21/17  Yes Rebecca Memos, DO  levothyroxine (SYNTHROID, LEVOTHROID) 88 MCG tablet Take 88 mcg by mouth daily.   Yes Rebecca Mullins  medroxyPROGESTERone (DEPO-PROVERA) 150 MG/ML injection INJECT 1 ML (150 MG TOTAL) INTO THE MUSCLE EVERY 3 MONTHS 03/05/16   Yes English, Rebecca Mullins  mycophenolate (CELLCEPT) 500 MG tablet Take 1,500 mg by mouth every 12 (twelve) hours.    Yes Rebecca Mullins  predniSONE (DELTASONE) 5 MG tablet Take 10 mg by mouth daily. 08/15/17 11/13/17 Yes Rebecca Mullins  Vitamin Mullins, Ergocalciferol, (DRISDOL) 50000 units CAPS capsule Take 50,000 Units by mouth every 7 (seven) days. On Friday   Yes Rebecca Mullins   Current Facility-Administered Medications  Medication Dose Route Frequency Provider Last Rate Last Dose  . 0.9 %  sodium chloride infusion   Intravenous Continuous Rebecca Costa, Mullins 50 mL/hr at 09/02/17 0207    . acetaminophen (TYLENOL) tablet 650 mg  650 mg Oral Q4H PRN Rebecca Costa, Mullins      . ALPRAZolam Duanne Moron) tablet 0.25 mg  0.25 mg Oral BID PRN Rebecca Costa, Mullins      . Rebecca Mullins ON 09-04-17] aspirin chewable tablet 324 mg  324 mg Oral Once Rebecca Costa, Mullins      . aztreonam (AZACTAM) 500 mg in dextrose 5 % 50 mL IVPB  500 mg Intravenous Q12H Rebecca Mullins, Rebecca Mullins   Stopped at 09/02/17 1240  . dextromethorphan-guaiFENesin (MUCINEX DM) 30-600 MG per 12 hr tablet 1 tablet  1 tablet Oral BID PRN Rebecca Costa, Mullins      . gabapentin (NEURONTIN) capsule 300 mg  300 mg Oral Daily Rebecca Costa, Mullins   300 mg at 09/02/17 1205  . heparin ADULT infusion 100 units/mL (25000 units/248mL sodium chloride 0.45%)  1,150 Units/hr Intravenous Continuous Rebecca Mullins, Rebecca Mullins      . hydrALAZINE (APRESOLINE) injection 5 mg  5 mg Intravenous Q2H PRN Rebecca Costa, Mullins      . levalbuterol Penne Lash) nebulizer solution 1.25 mg  1.25 mg Nebulization Q6H Rebecca Costa, Mullins   1.25 mg at 09/02/17 0730  . levothyroxine (SYNTHROID, LEVOTHROID) tablet 88 mcg  88 mcg Oral QAC breakfast Rebecca Costa, Mullins      . morphine 4 MG/ML injection 2 mg  2 mg Intravenous Q4H PRN Rebecca Costa, Mullins      . mycophenolate (CELLCEPT) capsule 1,500 mg  1,500 mg Oral Q12H Rebecca Costa, Mullins   1,500 mg at 09/02/17 1208  . ondansetron (ZOFRAN) injection 4 mg  4 mg Intravenous Q6H  PRN Rebecca Costa, Mullins      . predniSONE (DELTASONE) tablet 10 mg  10 mg Oral Daily Rebecca Costa, Mullins   10 mg at 09/02/17 1205  . sodium polystyrene (KAYEXALATE) 15 GM/60ML suspension 15 g  15 g Oral Once Rebecca Costa, Mullins   Stopped at 09/02/17 [phone]  . vancomycin (VANCOCIN) IVPB 1000 mg/200 mL premix  1,000 mg Intravenous Q M,W,F-HD Bryk, Veronda P, Rebecca Mullins      . zolpidem (AMBIEN) tablet 5 mg  5 mg Oral QHS PRN Rebecca Costa, Mullins       Current Outpatient Medications  Medication Sig Dispense Refill  . apixaban (ELIQUIS) 5 MG TABS tablet Take  5 mg by mouth 2 (two) times daily.    Marland Kitchen gabapentin (NEURONTIN) 600 MG tablet Take 0.5 tablets (300 mg total) by mouth daily. 30 tablet 0  . levothyroxine (SYNTHROID, LEVOTHROID) 88 MCG tablet Take 88 mcg by mouth daily.    . medroxyPROGESTERone (DEPO-PROVERA) 150 MG/ML injection INJECT 1 ML (150 MG TOTAL) INTO THE MUSCLE EVERY 3 MONTHS 1 mL 1  . mycophenolate (CELLCEPT) 500 MG tablet Take 1,500 mg by mouth every 12 (twelve) hours.     . predniSONE (DELTASONE) 5 MG tablet Take 10 mg by mouth daily.    . Vitamin Mullins, Ergocalciferol, (DRISDOL) 50000 units CAPS capsule Take 50,000 Units by mouth every 7 (seven) days. On Friday     Labs: Basic Metabolic Panel: Recent Labs  Lab 08/12/2017 2315 09/02/17 0222  NA 138 135  K 3.9 5.2*  CL 98* 99*  CO2 20* 17*  GLUCOSE 153* 150*  BUN 61* 61*  CREATININE 9.61* 9.62*  CALCIUM 9.4 8.8*   Liver Function Tests: Recent Labs  Lab 08/22/2017 2315 09/02/17 0222  AST 41 71*  ALT 28 34  ALKPHOS 174* 165*  BILITOT 0.5 1.2  PROT 8.4* 7.3  ALBUMIN 3.7 3.3*   Recent Labs  Lab 09/02/17 0222  LIPASE 57*   No results for input(s): [place] in the last 168 hours. CBC: Recent Labs  Lab 08/21/2017 2315  WBC 23.3*  NEUTROABS 19.9*  HGB 11.9*  HCT 37.7  MCV 89.5  PLT 321   Cardiac Enzymes: Recent Labs  Lab 09/02/17 0222 09/02/17 0857  TROPONINI 4.78* 30.34*   Studies/Results: Dg Chest 2 View  Result Date:  08/23/2017 CLINICAL DATA:  42 year old female with tachycardia and palpitation for EXAM: CHEST  2 VIEW COMPARISON:  Chest radiograph dated 07/17/2017 FINDINGS: Right-sided dialysis catheter with tip over the right atrium. There is mild interstitial edema similar or slightly worsened compared the prior radiograph. No focal consolidation, pleural effusion, or pneumothorax. The cardiac silhouette is within normal limits with no acute osseous pathology. IMPRESSION: 1. Mild interstitial edema.  No focal consolidation. 2. Right-sided dialysis catheter in stable position with tip in the right atrium. Electronically Signed   By: Anner Crete M.Mullins.   On: 08/28/2017 22:55    ROS: All others negative except those listed in HPI.   Physical Exam: Vitals:   09/02/17 1000 09/02/17 1015 09/02/17 1204 09/02/17 1300  BP: 103/89  118/89 127/76  Pulse: (!) 104 (!) 103    Resp: (!) 22 (!) 21 15 (!) 23  Temp:      TempSrc:      SpO2:  100% 100% 100%  Weight:      Height:         General: WDWN, NAD, female, appears younger than stated age Head: NCAT, sclera not icteric, MMM Neck: Supple. No lymphadenopathy. No JVD appreciated Lungs: CTA bilaterally. No wheeze, rales or rhonchi. Breathing is unlabored. Heart: tachycardia, regular rhythm. No murmur, rubs or gallops.  Abdomen: soft, nontender, +BS, no guarding, no rebound tenderness   Lower extremities:1+ edema b/l LE, +ulcer R heel dressed c/Mullins/i.   Neuro: A&Ox3. Moves all extremities spontaneously. Psych:  Responds to questions appropriately with a normal affect. Dialysis Access: R IJ TDC  Dialysis Orders:  MWF  - [facility]  4hrs, BFR 400, DFR 800,  EDW 89kg, 3K/ 2.25Ca, Profile 2  Access: TDC  Heparin 2900 Unit bolus mircera 44mcg IV q2wks - last 2/22 Hectorol 82mcg IV qHD Venofer 100mg  IV qHD - last  2/22 (was Mullins/c at OP unit)  Auryxia 1AC TID  Last Labs: 2/20 Hgb 9.4, K 4.5, 1/16: TSAT 12%, Ca 8.4, P 6.7, PTH 2157  Assessment/Plan: 1.  Chest  Pain/NSTEMI - troponin 30.34, EKG shows new T wave inversions. Cardiac cath planned for tomorrow. Per cardio 2. Sepsis 2/2 UTI - WBC 23.3, lactic acid 2.1, +UA, UC/BC pending. ABX started. - per primary 3. ESRD -  K mildly elevated, kayexalate. HD orders written for today per regular schedule.  4.  Hypertension/volume  - BP variable. If weight correct ~4L over EDW. CXR shows mild interstitial edema. Titrate down volume as tolerated.  5.  Anemia  - Hgb 11.9, follow trend. No indications for ESA at this time.  Received mircera on 2/22. 6.  Secondary Hyperparathyroidism -  Ca 8.8, outpatient P mildly elevated. Continue VRDA, and binders. 7.  Nutrition - Alb 3.3. Nepro, renavite. Renal diet with fluid restrictions.  8. Scleroderma -per primary 9. Recent PE - Eliquis held, on heparin drip. Per primary. 10. R heel pressure ulcer - wound care consulted. Per primary 11. Hypothyroidism - per primary  Jen Mow, Mullins-C [facility] Pager: [phone] 09/02/2017, 2:12 PM

## 2017-09-02 NOTE — ED Notes (Signed)
phlebotomy at bedside.  

## 2017-09-02 NOTE — ED Notes (Signed)
Rec'd call bk from cardiology NP; stated that she wld round to see patient and patient scheduled for Cath lab tomorrow

## 2017-09-02 NOTE — Progress Notes (Signed)
Progress Note  Patient Name: Rebecca Mullins Date of Encounter: 09/02/2017  Primary Cardiologist: Dr. Danise Mina ([facility])  Subjective   No chest pain.   Inpatient Medications    Scheduled Meds: . [START ON Sep 23, 2017] aspirin  324 mg Oral Once  . gabapentin  300 mg Oral Daily  . levalbuterol  1.25 mg Nebulization Q6H  . levothyroxine  88 mcg Oral QAC breakfast  . mycophenolate  1,500 mg Oral Q12H  . predniSONE  10 mg Oral Daily  . sodium polystyrene  15 g Oral Once   Continuous Infusions: . sodium chloride 50 mL/hr at 09/02/17 0207  . aztreonam    . heparin    . vancomycin     PRN Meds: acetaminophen, ALPRAZolam, dextromethorphan-guaiFENesin, hydrALAZINE, morphine injection, ondansetron (ZOFRAN) IV, zolpidem   Vital Signs    Vitals:   09/02/17 0945 09/02/17 0950 09/02/17 1000 09/02/17 1015  BP:   103/89   Pulse:  (!) 103 (!) 104 (!) 103  Resp: (!) 24 (!) 24 (!) 22 (!) 21  Temp:      TempSrc:      SpO2:  99%  100%  Weight:      Height:        Intake/Output Summary (Last 24 hours) at 09/02/2017 1019 Last data filed at 09/02/2017 2956 Gross per 24 hour  Intake 750 ml  Output -  Net 750 ml   Filed Weights   09/02/17 0000  Weight: 204 lb 12.9 oz (92.9 kg)    Telemetry    ST - Personally Reviewed   ECG    SR with TWI in anterolateral leads - Personally Reviewed  Physical Exam   General: Well developed, well nourished, obese AA female appearing in no acute distress. Head: Normocephalic, atraumatic.  Neck: Supple without bruits, JVD. Lungs:  Resp regular and unlabored, CTA. Heart: Tachy, S1, S2, no S3, S4, or murmur; no rub. HD cath in right upper chest.  Abdomen: Soft, non-tender, non-distended with normoactive bowel sounds.  Extremities: No clubbing, cyanosis, chronic LE edema. Distal pedal pulses are 2+ bilaterally. Neuro: Alert and oriented X 3. Moves all extremities spontaneously. Psych: Normal affect.  Labs    Chemistry Recent Labs  Lab  08/28/2017 2315 09/02/17 0222  NA 138 135  K 3.9 5.2*  CL 98* 99*  CO2 20* 17*  GLUCOSE 153* 150*  BUN 61* 61*  CREATININE 9.61* 9.62*  CALCIUM 9.4 8.8*  PROT 8.4* 7.3  ALBUMIN 3.7 3.3*  AST 41 71*  ALT 28 34  ALKPHOS 174* 165*  BILITOT 0.5 1.2  GFRNONAA 4* 4*  GFRAA 5* 5*  ANIONGAP 20* 19*     Hematology Recent Labs  Lab 09/04/2017 2315  WBC 23.3*  RBC 4.21  HGB 11.9*  HCT 37.7  MCV 89.5  MCH 28.3  MCHC 31.6  RDW 16.2*  PLT 321    Cardiac Enzymes Recent Labs  Lab 09/02/17 0222  TROPONINI 4.78*    Recent Labs  Lab 08/16/2017 2331  TROPIPOC 0.88*     BNPNo results for input(s): BNP, PROBNP in the last 168 hours.   DDimer No results for input(s): DDIMER in the last 168 hours.    Radiology    Dg Chest 2 View  Result Date: 08/22/2017 CLINICAL DATA:  42 year old female with tachycardia and palpitation for EXAM: CHEST  2 VIEW COMPARISON:  Chest radiograph dated 07/17/2017 FINDINGS: Right-sided dialysis catheter with tip over the right atrium. There is mild interstitial edema similar or slightly worsened  compared the prior radiograph. No focal consolidation, pleural effusion, or pneumothorax. The cardiac silhouette is within normal limits with no acute osseous pathology. IMPRESSION: 1. Mild interstitial edema.  No focal consolidation. 2. Right-sided dialysis catheter in stable position with tip in the right atrium. Electronically Signed   By: Anner Crete M.D.   On: 08/16/2017 22:55    Cardiac Studies   Cardiac MRI: 11/18  1. The left ventricle is normal in size with mild concentric left ventricular hypertrophy, in addition basal sigmoid septum seen. Global systolic function is normal, the LVEF is 63%. There is thinning and akinesia of the basal inferior wall and inferoseptum, as well as hypokinesia of the apical septum.   2. The right ventricle is normal in size and systolic function.   3. The atria are normal in size.  4. The aortic valve is  trileaflet in morphology. There is no significant aortic valve stenosis or regurgitation. There is trivial tricuspid and pulmonic valve regurgitation.  5. Due to renal failure, gadolinium contrast was not administered. Therefore, delayed enhancement imaging was not performed. Non-contrast imaging for tissue mapping without definitive abnormalities.  FINAL IMPRESSION: Regional wall motion abnormalities noted, etiology not clear based on non-contrast images but systemic sclerosis included in the differential diagnosis.   Echo: 8/18  INTERPRETATION --------------------------------------------------------------- NORMAL LEFT VENTRICULAR FUNCTION WITH MILD LVH NORMAL LA PRESSURES WITH NORMAL DIASTOLIC FUNCTION NORMAL RIGHT VENTRICULAR SYSTOLIC FUNCTION VALVULAR REGURGITATION: TRIVIAL MR, TRIVIAL TR NO VALVULAR STENOSIS INSUFFICIENT TR TO MEASURE RVSP  TTE: Pending  Patient Profile     42 y.o. female with PMH of scleroderma, ERSD on HD, recent PE on Eliquis, HTN, HL, and DM who presented with palpitations, short of breath and chest pain.   Assessment & Plan    1. Chest pain/NSTEMI: No further chest pain since admission. Trop up to 30.34. EKG this morning with new TWI in anterolateral leads. She does have concerning RFs for coronary disease, will need ischemic evaluation. Given her recent PE with [place], and sepsis ideally would like to plan for cath tomorrow as her last dose of Eliquis was yesterday morning.  -- continue to cycle trops -- check echo for WMA or heart strain  -- add heparin per PhD -- plan for cath in the am. Discussed with MD, to follow   2. Sepsis: Elevated lactic acid on admission. WBC 24. UA positive for UTI.  -- antibiotics per primary  3. PE: dx about a week ago. Eliquis as outpatient -- IV heparin given planned for cath this admisison  4. ERSD on HD: HD cath to upper chest. Had been on HD for about 5 weeks per her report. MWF.  -- nephrology to  follow  5. Scleroderma: followed by rheumatology through [facility].   6. Hyperkalemia: Given kayexalate. K+ 5.2 this morning.   7. HL: LDL above goal at 177. Would benefit from statin/Lovaza therapy.   8. PAD: recent dopplers noted R pSFA with >75% stenosis, and L SFA >75% dSFA. Noted a chronic right foot wound.   9. DM: Diet controlled per patient  Signed, Reino Bellis, NP  09/02/2017, 10:19 AM  Pager # [phone]   For questions or updates, please contact [place] Please consult [email or web address] for contact info under Cardiology/STEMI.

## 2017-09-02 NOTE — ED Notes (Signed)
Cards NP at bedside, states pt is cleared from cards to [place] today.

## 2017-09-02 NOTE — ED Notes (Signed)
Hosp paged due to pt c/o pulling in chest area; rec'd call bk frm Dr. Emmaline Life; she stated that cardiology needs to be notified; SN paging cardiology now.

## 2017-09-02 NOTE — ED Notes (Signed)
Cardiologist at bedside.  

## 2017-09-02 NOTE — ED Notes (Signed)
Physicians at bedside.

## 2017-09-02 NOTE — Progress Notes (Signed)
ANTICOAGULATION CONSULT NOTE - Initial Consult  Pharmacy Consult for heparin Indication: pulmonary embolus vs ACS  Allergies  Allergen Reactions  . Penicillins Anaphylaxis    Has patient had a PCN reaction causing immediate rash, facial/tongue/throat swelling, SOB or lightheadedness with hypotension: Yes Has patient had a PCN reaction causing severe rash involving mucus membranes or skin necrosis: No Has patient had a PCN reaction that required hospitalization No Has patient had a PCN reaction occurring within the last 10 years: No If all of the above answers are "NO", then may proceed with Cephalosporin use.   . Nsaids     Kidney issues  . Oxycodone     Patient says unable to take this medicine.    Patient Measurements: Height: 5\' 2"  (157.5 cm) Weight: 204 lb 12.9 oz (92.9 kg) IBW/kg (Calculated) : 50.1 Heparin Dosing Weight: 71 kg  Vital Signs: Temp: 98.7 F (37.1 C) (02/24 2356) Temp Source: Rectal (02/24 2356) BP: 103/82 (02/25 0300) Pulse Rate: 110 (02/25 0300)  Labs: Recent Labs    08/13/2017 2315 09/02/17 0222  HGB 11.9*  --   HCT 37.7  --   PLT 321  --   APTT  --  40*  LABPROT  --  15.5*  INR  --  1.24  CREATININE 9.61* 9.62*  TROPONINI  --  4.78*     Medical History: Past Medical History:  Diagnosis Date  . Anemia   . Anxiety   . Bilateral cellulitis of lower leg   . Chronic kidney disease    stage 5  . Dyspnea   . GERD (gastroesophageal reflux disease)   . Glomerulonephritis    Archie Endo 07/25/2015  . Hypertension    "just starting" (07/27/2015)  . Hypothyroidism   . Pneumonia   . Poor circulation    Raynaud's disease  . Scleroderma ([place])    "fingers" (07/27/2015)  . Strep throat 1977; winter 2016  . Type II diabetes mellitus ([place]) dx'd 09/2014     Assessment: Admitted with SOB, chest pain and emesis x1 episode. Also with likely infection ongoing at this time as well. She is ESRD on HD.  Patient is on Eliquis prior to admission for history  of PE - she would do better with warfarin given ESRD. Planning to start a heparin infusion for NSTEMI and also as a bridge from Eliquis. Ms. Coggin inadvertently received 1 dose of Eliquis in the ED and 1 dose of Lovenox in the ED within two hours of each other. Her CBC is stable. Her baseline INR is 1.2, though I would have expected this to be a bit with someone on Eliquis. Will start heparin at 1400 - 12 hours after her last dose of Eliquis.   Goal of Therapy:  Heparin level 0.3-0.7 units/ml Monitor platelets by anticoagulation protocol: Yes    Plan:  -Heparin infusion at 1150 units/hr beginning at 1400, hold initial bolus -Daily HL, CBC, aPTT -First level in 8 hours   Harvel Quale 09/02/2017,5:44 AM

## 2017-09-02 NOTE — Progress Notes (Signed)
eLink Physician-Brief Progress Note Patient Name: Rebecca Mullins DOB: Sep 20, 1975 MRN: [id number]   Date of Service  09/02/2017  HPI/Events of Note  42 yo female with complex PMH including HTN, ILD, Scleroderma, Autoimmune GN and PE 2 weeks ago at [facility] on Eliquis. Patient presents with chest pain and ST segment depression. Troponin = 4.78 --> 30.34 --> 29.59.  Cardiology request PCCM consult. VSS except for RR = 49 and HR = 134.    eICU Interventions  Will ask ground team to evaluate a bedside.      Intervention Category Evaluation Type: New Patient Evaluation  Lysle Dingwall 09/02/2017, 11:08 PM

## 2017-09-02 NOTE — ED Notes (Signed)
Wound RN at bedside. 

## 2017-09-02 NOTE — ED Notes (Signed)
Round Physician at bedside. New order for diet entered.  Pt will not be going to the Cath lab to day.

## 2017-09-02 NOTE — Consult Note (Signed)
Name: Rebecca Mullins MRN: [id number] DOB: 01/27/1976    ADMISSION DATE:  08/29/2017 CONSULTATION DATE:  2/225/19  REFERRING MD :  Fudim  CHIEF COMPLAINT:  Chest pain   HISTORY OF PRESENT ILLNESS:  Rebecca Mullins is a 42 y.o. 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  She presented to [facility] 2/24 with chest pain and SOB and was found to have NSTEMI.  She was admitted early AM 2/25 and was seen by cardiology in consultation.  Plans for cardiac cath are being considered for 2/26.  Later that evening, PCCM was asked to see in consultation due to tachycardia and increased WOB.  She has received a 1 time dose of 40mg  lasix but has not had any UOP as of yet. She feels very anxious that her heart is racing.  In addition, she has also had 2 episodes of hemoptysis since being in the ED.  She denies any prior hemoptysis.   PAST MEDICAL HISTORY :   has a past medical history of Anemia, Anxiety, Bilateral cellulitis of lower leg, Chronic kidney disease, Dyspnea, GERD (gastroesophageal reflux disease), Glomerulonephritis, Hypertension, Hypothyroidism, Pneumonia, Poor circulation, Scleroderma ([place]), Strep throat (1977; winter 2016), and Type II diabetes mellitus ([place]) (dx'd 09/2014).  has a past surgical history that includes Renal biopsy (Left, 07/27/2015) and Insertion of dialysis catheter (Right, 07/17/2017). Prior to Admission medications   Medication Sig Start Date End Date Taking? Authorizing Provider  apixaban (ELIQUIS) 5 MG TABS tablet Take 5 mg by mouth 2 (two) times daily. 08/28/17 09/11/17 Yes [provider]  gabapentin (NEURONTIN) 600 MG tablet Take 0.5 tablets (300 mg total) by mouth daily. 07/21/17  Yes Kayleen Memos, DO  levothyroxine (SYNTHROID,  LEVOTHROID) 88 MCG tablet Take 88 mcg by mouth daily.   Yes [provider]  medroxyPROGESTERone (DEPO-PROVERA) 150 MG/ML injection INJECT 1 ML (150 MG TOTAL) INTO THE MUSCLE EVERY 3 MONTHS 03/05/16  Yes English, Stephanie D, PA  mycophenolate (CELLCEPT) 500 MG tablet Take 1,500 mg by mouth every 12 (twelve) hours.    Yes [provider]  predniSONE (DELTASONE) 5 MG tablet Take 10 mg by mouth daily. 08/15/17 11/13/17 Yes [provider]  Vitamin D, Ergocalciferol, (DRISDOL) 50000 units CAPS capsule Take 50,000 Units by mouth every 7 (seven) days. On Friday   Yes [provider]   Allergies  Allergen Reactions  . Penicillins Anaphylaxis    Has patient had a PCN reaction causing immediate rash, facial/tongue/throat swelling, SOB or lightheadedness with hypotension: Yes Has patient had a PCN reaction causing severe rash involving mucus membranes or skin necrosis: No Has patient had a PCN reaction that required hospitalization No Has patient had a PCN reaction occurring within the last 10 years: No If all of the above answers are "NO", then may proceed with Cephalosporin use.   . Nsaids     Kidney issues  . Oxycodone     Patient says unable to take this medicine.    FAMILY HISTORY:  family history includes Cancer (age of onset: 62) in her mother; Diabetes in her brother and mother; Hyperlipidemia in her father. SOCIAL HISTORY:  reports that she quit smoking about 22 years ago. Her smoking use included cigarettes. She started smoking about 24 years ago. She has a  2.00 pack-year smoking history. she has never used smokeless tobacco. She reports that she drinks alcohol. She reports that she does not use drugs.  REVIEW OF SYSTEMS:   All negative; except for those that are bolded, which indicate positives.  Constitutional: weight loss, weight gain, night sweats, fevers, chills, fatigue, weakness.  HEENT: headaches, sore throat, sneezing, nasal congestion, post  nasal drip, difficulty swallowing, tooth/dental problems, visual complaints, visual changes, ear aches. Neuro: difficulty with speech, weakness, numbness, ataxia. CV:  chest pain, orthopnea, PND, swelling in lower extremities, dizziness, palpitations, syncope.  Resp: cough, hemoptysis, dyspnea, wheezing. GI: heartburn, indigestion, abdominal pain, nausea, vomiting, diarrhea, constipation, change in bowel habits, loss of appetite, hematemesis, melena, hematochezia.  GU: dysuria, change in color of urine, urgency or frequency, flank pain, hematuria. MSK: joint pain or swelling, decreased range of motion. Psych: change in mood or affect, depression, anxiety, suicidal ideations, homicidal ideations. Skin: rash, itching, bruising.   SUBJECTIVE:  Very anxious.  Has worsening chest pain, 10/10 now.  VITAL SIGNS: Temp:  [98 F (36.7 C)-99 F (37.2 C)] 99 F (37.2 C) (02/25 1942) Pulse Rate:  [96-119] 103 (02/25 1015) Resp:  [15-36] 23 (02/25 1300) BP: (85-127)/(58-95) 127/76 (02/25 1300) SpO2:  [98 %-100 %] 100 % (02/25 1300) Weight:  [92.9 kg (204 lb 12.9 oz)] 92.9 kg (204 lb 12.9 oz) (02/25 0000)  PHYSICAL EXAMINATION: General: Adult female, resting in bed, very anxious. Neuro: A&O x 3, non-focal.  HEENT: [place]/AT. EOMI, sclerae anicteric. Cardiovascular: Tachy, regular, no M/R/G.  Lungs: Respirations shallow and mildly labored.  CTA bilaterally, No W/R/R. Abdomen: BS x 4, soft, NT/ND.  Musculoskeletal: No gross deformities, no edema.  Skin: Dry, warm, some skin scaling noted.   Recent Labs  Lab 08/29/2017 2315 09/02/17 0222  NA 138 135  K 3.9 5.2*  CL 98* 99*  CO2 20* 17*  BUN 61* 61*  CREATININE 9.61* 9.62*  GLUCOSE 153* 150*   Recent Labs  Lab 08/23/2017 2315  HGB 11.9*  HCT 37.7  WBC 23.3*  PLT 321   Dg Chest 2 View  Result Date: 08/29/2017 CLINICAL DATA:  42 year old female with tachycardia and palpitation for EXAM: CHEST  2 VIEW COMPARISON:  Chest radiograph dated  07/17/2017 FINDINGS: Right-sided dialysis catheter with tip over the right atrium. There is mild interstitial edema similar or slightly worsened compared the prior radiograph. No focal consolidation, pleural effusion, or pneumothorax. The cardiac silhouette is within normal limits with no acute osseous pathology. IMPRESSION: 1. Mild interstitial edema.  No focal consolidation. 2. Right-sided dialysis catheter in stable position with tip in the right atrium. Electronically Signed   By: Anner Crete M.D.   On: 08/25/2017 22:55    STUDIES:  CXR 2/24 > mild interstitial edema. Echo 2/26 >  LHC 2/26 >   SIGNIFICANT EVENTS  2/25 > admit.  ASSESSMENT / PLAN:  NSTEMI - now with worsening chest pain, rated at 10/10. Sinus tachycardia. Plan: Cardiology following, considering [place] 2/26. Continue heparin gtt. F/u echo. Discussed with cards, recommended starting nitro gtt now. Lopressor 5mg  q6h. Morphine PRN.  Hypotension - presumed due to above + worsened by nitro. Plan: Neosynephrine for goal MAP > 65.  UTI - cultures pending. Plan: Continue empiric abx. Follow cultures.  Recent PE - was on eliquis.  Per report has not missed any doses; therefore, low likelihood of increased clot burden. Plan: Continue heparin gtt in lieu of preadmission eliquis.  Hemoptysis - new since in ED.  ? Related to PE  vs undiagnosed Wegeners. Plan: Assess ANCA. Continue heparin gtt for now unless hemoptysis worsens. Consider repeat CT scan, unable to do now as pt can not lie flat.  ESRD (M/W/F). Plan: Renal following.  Hx hypothyroidism. Plan: Continue synthroid, change to IV formulation.  Hx DM - diet controlled. Plan: SSI if glucose consistently > 180.  Hx Scleroderma. Plan: Continue cellcept, prednisone. F/u with [facility]ology at [facility].  Will admit to ICU tonight.  Discussed with cardiology fellow.   Montey Hora, [facility] [place] [facility] Pager: [phone]  or [phone] 09/02/2017, 10:02 PM

## 2017-09-02 NOTE — ED Notes (Signed)
Spoke w/ lab who stated they would add on lipase & troponin I to blood work that was sent down earlier.

## 2017-09-02 NOTE — ED Notes (Signed)
Pt extremely hard stick. Awaiting abx to finish and will draw labs from line.

## 2017-09-02 NOTE — Progress Notes (Signed)
PCCM Interval Progress Note  Pt transferred to ICU.  Chest pain improved but still present, 8/10 now. HR back in 130's, pt anxious as feels heart is racing.  Still having hemoptysis but not overwhelming / massive.  Nitro has not yet been started.  Will give additional 2mg  morphine and 2.5mg  lopressor.  Nitro to be started soon. RN asked to continue to monitor.   Montey Hora, [place] [facility] Pager: [phone]  or [phone] 09/02/2017, 11:06 PM

## 2017-09-02 NOTE — Progress Notes (Signed)
  Echocardiogram 2D Echocardiogram has been performed.  Bobbye Charleston 09/02/2017, 8:31 PM

## 2017-09-03 ENCOUNTER — Inpatient Hospital Stay (HOSPITAL_COMMUNITY): Payer: Medicaid Other

## 2017-09-03 ENCOUNTER — Inpatient Hospital Stay (HOSPITAL_COMMUNITY): Admission: EM | Disposition: E | Payer: Self-pay | Source: Home / Self Care | Attending: Pulmonary Disease

## 2017-09-03 ENCOUNTER — Encounter (HOSPITAL_COMMUNITY): Payer: Self-pay | Admitting: Cardiology

## 2017-09-03 ENCOUNTER — Other Ambulatory Visit: Payer: Self-pay

## 2017-09-03 DIAGNOSIS — I469 Cardiac arrest, cause unspecified: Secondary | ICD-10-CM

## 2017-09-03 DIAGNOSIS — R570 Cardiogenic shock: Secondary | ICD-10-CM

## 2017-09-03 DIAGNOSIS — J9600 Acute respiratory failure, unspecified whether with hypoxia or hypercapnia: Secondary | ICD-10-CM

## 2017-09-03 HISTORY — PX: RIGHT/LEFT HEART CATH AND CORONARY ANGIOGRAPHY: CATH118266

## 2017-09-03 LAB — BRAIN NATRIURETIC PEPTIDE: B NATRIURETIC PEPTIDE 5: 1758.5 pg/mL — AB (ref 0.0–100.0)

## 2017-09-03 LAB — POCT I-STAT 3, ART BLOOD GAS (G3+)
ACID-BASE DEFICIT: 17 mmol/L — AB (ref 0.0–2.0)
Acid-base deficit: 22 mmol/L — ABNORMAL HIGH (ref 0.0–2.0)
Acid-base deficit: 7 mmol/L — ABNORMAL HIGH (ref 0.0–2.0)
Bicarbonate: 10.7 mmol/L — ABNORMAL LOW (ref 20.0–28.0)
Bicarbonate: 10.2 mmol/L — ABNORMAL LOW (ref 20.0–28.0)
Bicarbonate: 21.8 mmol/L (ref 20.0–28.0)
O2 SAT: 100 %
O2 SAT: 79 %
O2 Saturation: 90 %
PCO2 ART: 60 mmHg — AB (ref 32.0–48.0)
PO2 ART: 102 mmHg (ref 83.0–108.0)
PO2 ART: 285 mmHg — AB (ref 83.0–108.0)
PO2 ART: 60 mmHg — AB (ref 83.0–108.0)
Patient temperature: 97.9
TCO2: 13 mmol/L — ABNORMAL LOW (ref 22–32)
TCO2: 11 mmol/L — ABNORMAL LOW (ref 22–32)
TCO2: 24 mmol/L (ref 22–32)
pCO2 arterial: 29 mmHg — ABNORMAL LOW (ref 32.0–48.0)
pCO2 arterial: 71.2 mmHg (ref 32.0–48.0)
pH, Arterial: 6.857 — CL (ref 7.350–7.450)
pH, Arterial: 7.155 — CL (ref 7.350–7.450)
pH, Arterial: 7.095 — CL (ref 7.350–7.450)

## 2017-09-03 LAB — BASIC METABOLIC PANEL
Anion gap: 16 — ABNORMAL HIGH (ref 5–15)
BUN: 22 mg/dL — ABNORMAL HIGH (ref 6–20)
CALCIUM: 8.4 mg/dL — AB (ref 8.9–10.3)
CO2: 23 mmol/L (ref 22–32)
CREATININE: 3.6 mg/dL — AB (ref 0.44–1.00)
Chloride: 100 mmol/L — ABNORMAL LOW (ref 101–111)
GFR calc Af Amer: 17 mL/min — ABNORMAL LOW (ref >60)
GFR calc non Af Amer: 15 mL/min — ABNORMAL LOW (ref >60)
GLUCOSE: 144 mg/dL — AB (ref 65–99)
Potassium: 3.2 mmol/L — ABNORMAL LOW (ref 3.5–5.1)
Sodium: 139 mmol/L (ref 135–145)

## 2017-09-03 LAB — CBC
HCT: 35.7 % — ABNORMAL LOW (ref 36.0–46.0)
HEMATOCRIT: 31.1 % — AB (ref 36.0–46.0)
HEMOGLOBIN: 10.6 g/dL — AB (ref 12.0–15.0)
Hemoglobin: 9.7 g/dL — ABNORMAL LOW (ref 12.0–15.0)
MCH: 27.5 pg (ref 26.0–34.0)
MCH: 27.3 pg (ref 26.0–34.0)
MCHC: 31.2 g/dL (ref 30.0–36.0)
MCHC: 29.7 g/dL — AB (ref 30.0–36.0)
MCV: 88.1 fL (ref 78.0–100.0)
MCV: 92 fL (ref 78.0–100.0)
PLATELETS: 345 10*3/uL (ref 150–400)
Platelets: 454 10*3/uL — ABNORMAL HIGH (ref 150–400)
RBC: 3.53 MIL/uL — ABNORMAL LOW (ref 3.87–5.11)
RBC: 3.88 MIL/uL (ref 3.87–5.11)
RDW: 16.8 % — AB (ref 11.5–15.5)
RDW: 17.1 % — ABNORMAL HIGH (ref 11.5–15.5)
WBC: 29.1 10*3/uL — ABNORMAL HIGH (ref 4.0–10.5)
WBC: 37.7 10*3/uL — ABNORMAL HIGH (ref 4.0–10.5)

## 2017-09-03 LAB — RENAL FUNCTION PANEL
ANION GAP: 22 — AB (ref 5–15)
Albumin: 3.2 g/dL — ABNORMAL LOW (ref 3.5–5.0)
BUN: 76 mg/dL — ABNORMAL HIGH (ref 6–20)
CALCIUM: 8.8 mg/dL — AB (ref 8.9–10.3)
CHLORIDE: 101 mmol/L (ref 101–111)
CO2: 14 mmol/L — AB (ref 22–32)
Creatinine, Ser: 10.23 mg/dL — ABNORMAL HIGH (ref 0.44–1.00)
GFR calc Af Amer: 5 mL/min — ABNORMAL LOW (ref >60)
GFR calc non Af Amer: 4 mL/min — ABNORMAL LOW (ref >60)
GLUCOSE: 174 mg/dL — AB (ref 65–99)
Phosphorus: 6.6 mg/dL — ABNORMAL HIGH (ref 2.5–4.6)
Potassium: 5.3 mmol/L — ABNORMAL HIGH (ref 3.5–5.1)
SODIUM: 137 mmol/L (ref 135–145)

## 2017-09-03 LAB — URINE CULTURE

## 2017-09-03 LAB — HEPARIN LEVEL (UNFRACTIONATED): Heparin Unfractionated: 1.64 IU/mL — ABNORMAL HIGH (ref 0.30–0.70)

## 2017-09-03 LAB — MRSA PCR SCREENING: MRSA by PCR: NEGATIVE

## 2017-09-03 LAB — HEMOGLOBIN A1C
Hgb A1c MFr Bld: 5.6 % (ref 4.8–5.6)
Mean Plasma Glucose: 114 mg/dL

## 2017-09-03 LAB — MAGNESIUM: Magnesium: 1.8 mg/dL (ref 1.7–2.4)

## 2017-09-03 LAB — HEPATITIS B SURFACE ANTIGEN: HEP B S AG: NEGATIVE

## 2017-09-03 LAB — PHOSPHORUS: Phosphorus: 2.9 mg/dL (ref 2.5–4.6)

## 2017-09-03 LAB — APTT: aPTT: 87 seconds — ABNORMAL HIGH (ref 24–36)

## 2017-09-03 LAB — HCG, QUANTITATIVE, PREGNANCY: hCG, Beta Chain, Quant, S: 15 m[IU]/mL — ABNORMAL HIGH (ref <5)

## 2017-09-03 SURGERY — RIGHT/LEFT HEART CATH AND CORONARY ANGIOGRAPHY
Anesthesia: LOCAL

## 2017-09-03 MED ORDER — MIDAZOLAM HCL 2 MG/2ML IJ SOLN
INTRAMUSCULAR | Status: AC
  Filled 2017-09-03: qty 2

## 2017-09-03 MED ORDER — FENTANYL 2500MCG IN NS 250ML (10MCG/ML) PREMIX INFUSION: 0.0000 ug/h | INTRAVENOUS | Status: DC

## 2017-09-03 MED ORDER — SODIUM BICARBONATE 8.4 % IV SOLN
100.0000 meq | Freq: Once | INTRAVENOUS | Status: DC
  Filled 2017-09-03: qty 150

## 2017-09-03 MED ORDER — SODIUM BICARBONATE 8.4 % IV SOLN
INTRAVENOUS | Status: AC
  Filled 2017-09-03: qty 100

## 2017-09-03 MED ORDER — IOPAMIDOL (ISOVUE-370) INJECTION 76%
INTRAVENOUS | Status: AC
  Filled 2017-09-03: qty 100

## 2017-09-03 MED ORDER — EPINEPHRINE PF 1 MG/10ML IJ SOSY
PREFILLED_SYRINGE | INTRAMUSCULAR | Status: AC
  Filled 2017-09-03: qty 20

## 2017-09-03 MED ORDER — MIDAZOLAM HCL 2 MG/2ML IJ SOLN: 2.0000 mg | INTRAMUSCULAR | Status: DC | PRN

## 2017-09-03 MED ORDER — LIDOCAINE HCL (PF) 1 % IJ SOLN
INTRAMUSCULAR | Status: DC | PRN
  Administered 2017-09-03: 15 mL

## 2017-09-03 MED ORDER — EPINEPHRINE PF 1 MG/10ML IJ SOSY
PREFILLED_SYRINGE | INTRAMUSCULAR | Status: DC | PRN
  Administered 2017-09-03 (×6): 1 mg via INTRAVENOUS

## 2017-09-03 MED ORDER — SODIUM CHLORIDE 0.9% FLUSH
3.0000 mL | Freq: Two times a day (BID) | INTRAVENOUS | Status: DC
  Administered 2017-09-03: 3 mL via INTRAVENOUS

## 2017-09-03 MED ORDER — IOPAMIDOL (ISOVUE-370) INJECTION 76%
INTRAVENOUS | Status: DC | PRN
  Administered 2017-09-03: 40 mL

## 2017-09-03 MED ORDER — SODIUM CHLORIDE 0.9% FLUSH: 3.0000 mL | INTRAVENOUS | Status: DC | PRN

## 2017-09-03 MED ORDER — LEVALBUTEROL HCL 1.25 MG/0.5ML IN NEBU: 1.2500 mg | INHALATION_SOLUTION | Freq: Four times a day (QID) | RESPIRATORY_TRACT | Status: DC | PRN

## 2017-09-03 MED ORDER — DEXTROSE 5 % IV SOLN
0.5000 ug/min | INTRAVENOUS | Status: DC
  Administered 2017-09-03: 10 ug/min via INTRAVENOUS
  Filled 2017-09-03: qty 4

## 2017-09-03 MED ORDER — ASPIRIN 81 MG PO CHEW: 81.0000 mg | CHEWABLE_TABLET | ORAL | Status: DC

## 2017-09-03 MED ORDER — SODIUM CHLORIDE 0.9 % IV SOLN
INTRAVENOUS | Status: DC
  Administered 2017-09-03: 06:00:00 via INTRAVENOUS

## 2017-09-03 MED ORDER — DEXTROSE 5 % IV SOLN
0.0000 ug/min | INTRAVENOUS | Status: DC
  Filled 2017-09-03: qty 16

## 2017-09-03 MED ORDER — VANCOMYCIN HCL IN DEXTROSE 1-5 GM/200ML-% IV SOLN: 1000.0000 mg | Freq: Once | INTRAVENOUS | Status: DC

## 2017-09-03 MED ORDER — SODIUM BICARBONATE 8.4 % IV SOLN
INTRAVENOUS | Status: DC | PRN
  Administered 2017-09-03 (×8): 50 meq via INTRAVENOUS

## 2017-09-03 MED ORDER — HEPARIN (PORCINE) IN NACL 2-0.9 UNIT/ML-% IJ SOLN
INTRAMUSCULAR | Status: AC
  Filled 2017-09-03: qty 1000

## 2017-09-03 MED ORDER — SODIUM CHLORIDE 0.9 % IV SOLN: 250.0000 mL | INTRAVENOUS | Status: DC | PRN

## 2017-09-03 MED ORDER — HEPARIN (PORCINE) IN NACL 2-0.9 UNIT/ML-% IJ SOLN
INTRAMUSCULAR | Status: AC
  Filled 2017-09-03: qty 500

## 2017-09-03 MED ORDER — SODIUM CHLORIDE 0.9 % IV SOLN
0.0000 ug/min | INTRAVENOUS | Status: DC
  Filled 2017-09-03: qty 4

## 2017-09-03 MED ORDER — SODIUM BICARBONATE 8.4 % IV SOLN
INTRAVENOUS | Status: DC
  Administered 2017-09-03: 10:00:00 via INTRAVENOUS
  Filled 2017-09-03 (×2): qty 150

## 2017-09-03 MED ORDER — FENTANYL CITRATE (PF) 100 MCG/2ML IJ SOLN
INTRAMUSCULAR | Status: AC
  Filled 2017-09-03: qty 2

## 2017-09-03 MED ORDER — HYDROCORTISONE NA SUCCINATE PF 100 MG IJ SOLR: 100.0000 mg | Freq: Three times a day (TID) | INTRAMUSCULAR | Status: DC

## 2017-09-03 MED ORDER — VANCOMYCIN HCL IN DEXTROSE 1-5 GM/200ML-% IV SOLN
INTRAVENOUS | Status: AC
  Administered 2017-09-03: 1 g via INTRAVENOUS
  Filled 2017-09-03: qty 200

## 2017-09-03 MED ORDER — LIDOCAINE HCL (PF) 1 % IJ SOLN
INTRAMUSCULAR | Status: AC
  Filled 2017-09-03: qty 30

## 2017-09-03 MED ORDER — EPINEPHRINE PF 1 MG/10ML IJ SOSY
PREFILLED_SYRINGE | INTRAMUSCULAR | Status: AC
  Filled 2017-09-03: qty 40

## 2017-09-03 MED ORDER — ORAL CARE MOUTH RINSE: 15.0000 mL | Freq: Two times a day (BID) | OROMUCOSAL | Status: DC

## 2017-09-03 SURGICAL SUPPLY — 14 items
CATH INFINITI 5FR AL1 (CATHETERS) ×2 IMPLANT
CATH INFINITI 5FR MULTPACK ANG (CATHETERS) ×2 IMPLANT
CATH SWAN GANZ 7F STRAIGHT (CATHETERS) ×2 IMPLANT
COVER PRB 48X5XTLSCP FOLD TPE (BAG) ×1 IMPLANT
COVER PROBE 5X48 (BAG) ×1
HOVERMATT SINGLE USE (MISCELLANEOUS) ×2 IMPLANT
KIT HEART LEFT (KITS) ×2 IMPLANT
PACK CARDIAC CATHETERIZATION (CUSTOM PROCEDURE TRAY) ×2 IMPLANT
SHEATH PINNACLE 5F 10CM (SHEATH) ×2 IMPLANT
SHEATH PINNACLE 7F 10CM (SHEATH) ×2 IMPLANT
SLEEVE REPOSITIONING LENGTH 30 (MISCELLANEOUS) ×2 IMPLANT
TRANSDUCER W/STOPCOCK (MISCELLANEOUS) ×2 IMPLANT
TUBING CIL FLEX 10 FLL-RA (TUBING) ×2 IMPLANT
WIRE EMERALD 3MM-J .035X150CM (WIRE) ×2 IMPLANT

## 2017-09-04 LAB — RESPIRATORY PANEL BY PCR
Adenovirus: NOT DETECTED
BORDETELLA PERTUSSIS-RVPCR: NOT DETECTED
CHLAMYDOPHILA PNEUMONIAE-RVPPCR: NOT DETECTED
Coronavirus 229E: NOT DETECTED
Coronavirus HKU1: NOT DETECTED
Coronavirus NL63: NOT DETECTED
Coronavirus OC43: NOT DETECTED
INFLUENZA A-RVPPCR: NOT DETECTED
Influenza B: NOT DETECTED
Metapneumovirus: NOT DETECTED
Mycoplasma pneumoniae: NOT DETECTED
PARAINFLUENZA VIRUS 3-RVPPCR: NOT DETECTED
Parainfluenza Virus 1: NOT DETECTED
Parainfluenza Virus 2: NOT DETECTED
Parainfluenza Virus 4: NOT DETECTED
RHINOVIRUS / ENTEROVIRUS - RVPPCR: NOT DETECTED
Respiratory Syncytial Virus: NOT DETECTED

## 2017-09-04 LAB — POCT I-STAT 3, ART BLOOD GAS (G3+)
ACID-BASE DEFICIT: 6 mmol/L — AB (ref 0.0–2.0)
BICARBONATE: 24 mmol/L (ref 20.0–28.0)
O2 Saturation: 75 %
PH ART: 7.061 — AB (ref 7.350–7.450)
TCO2: 26 mmol/L (ref 22–32)
pCO2 arterial: 84.4 mmHg (ref 32.0–48.0)
pO2, Arterial: 58 mmHg — ABNORMAL LOW (ref 83.0–108.0)

## 2017-09-04 MED FILL — Medication: Qty: 1 | Status: AC

## 2017-09-04 MED FILL — Heparin Sodium (Porcine) 2 Unit/ML in Sodium Chloride 0.9%: INTRAMUSCULAR | Qty: 1500 | Status: AC

## 2017-09-04 MED FILL — Heparin Sodium (Porcine) 2 Unit/ML in Sodium Chloride 0.9%: INTRAMUSCULAR | Qty: 2000 | Status: AC

## 2017-09-05 LAB — ANCA TITERS
Atypical P-ANCA titer: <1:20 {titer}
C-ANCA: <1:20 {titer}
P-ANCA: <1:20 {titer}

## 2017-09-06 NOTE — Progress Notes (Signed)
HD tx ended 37 min early d/t unstable bp on cuff reading, a- line placed and verified bp is actually unstable, map is up and down, paged MD 2x w/ no return call,called CN and consulted her w/ issues I was having and both agreed I should rinse back if bp didn't respond in the next few mins even though pt appears to be asymptomatic, bp didn't respond, rinsed back, UF goal not met, bp still unstable at this point and pt now c/o not feeling well. Report given to Anne Shutter, RN

## 2017-09-06 NOTE — Progress Notes (Signed)
Name: Rebecca Mullins MRN: [id number] DOB: 16-Jun-1976    ADMISSION DATE:  08/23/2017 CONSULTATION DATE:  2/225/19  REFERRING MD :  Fudim  CHIEF COMPLAINT:  Chest pain   HISTORY OF PRESENT ILLNESS:  Rebecca Mullins is a 42 y.o. 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  She presented to [facility] 2/24 with chest pain and SOB and was found to have NSTEMI.  She was admitted early AM 2/25 and was seen by cardiology in consultation.   Later that evening, PCCM was asked to see in consultation due to tachycardia and increased WOB.  She  had 2 episodes of hemoptysis since being in the ED.    ETT 2/26 >>  SUBJECTIVE:   Called emergently to bedside tachypneic 50s , tachycardic Orthopneic, feels like "im going to die' ' get me some relief' Family at bedside Unable to pick up BP well by cuff   VITAL SIGNS: Temp:  [97.8 F (36.6 C)-99 F (37.2 C)] 97.9 F (36.6 C) (02/26 0515) Pulse Rate:  [103-137] 124 (02/26 0730) Resp:  [15-56] 52 (02/26 0740) BP: (40-128)/(13-100) 56/32 (02/26 0740) SpO2:  [99 %-100 %] 100 % (02/26 0730) Arterial Line BP: (64-80)/(50-65) 64/50 (02/26 0500) Weight:  [194 lb 7.1 oz (88.2 kg)] 194 lb 7.1 oz (88.2 kg) (02/26 0515)  PHYSICAL EXAMINATION: General: Adult female, sitting up, very anxious. Neuro: A&O x 3, non-focal.  HEENT: [place]/AT. EOMI, sclerae anicteric. Cardiovascular: Tachy, regular, no M/R/G.  Lungs: accessory muscles +,Respirations shallow and mildly labored.  Scattered crackles BL Abdomen: BS x 4, soft, NT/ND.  Musculoskeletal: No gross deformities, no edema.  Skin:pinched skin of scleroderma, scaling noted.   Recent Labs  Lab 09/02/17 0222 09-28-2017 0004 September 28, 2017 0429  NA 135 137 139  K 5.2* 5.3* 3.2*  CL 99* 101 100*  CO2 17* 14* 23  BUN 61* 76* 22*   CREATININE 9.62* 10.23* 3.60*  GLUCOSE 150* 174* 144*   Recent Labs  Lab 08/31/2017 2315 September 28, 2017 0004 09/28/2017 0429  HGB 11.9* 10.6* 9.7*  HCT 37.7 35.7* 31.1*  WBC 23.3* 29.1* 37.7*  PLT 321 345 454*   Dg Chest 2 View  Result Date: 09/05/2017 CLINICAL DATA:  42 year old female with tachycardia and palpitation for EXAM: CHEST  2 VIEW COMPARISON:  Chest radiograph dated 07/17/2017 FINDINGS: Right-sided dialysis catheter with tip over the right atrium. There is mild interstitial edema similar or slightly worsened compared the prior radiograph. No focal consolidation, pleural effusion, or pneumothorax. The cardiac silhouette is within normal limits with no acute osseous pathology. IMPRESSION: 1. Mild interstitial edema.  No focal consolidation. 2. Right-sided dialysis catheter in stable position with tip in the right atrium. Electronically Signed   By: Anner Crete M.D.   On: 08/26/2017 22:55   Dg Chest Port 1 View  Result Date: 09/02/2017 CLINICAL DATA:  Shortness of breath starting yesterday EXAM: PORTABLE CHEST 1 VIEW COMPARISON:  September 01, 2017 FINDINGS: The mediastinal contour is normal. The heart size is normal. There is pulmonary edema. There is no focal pneumonia or pleural effusion. Right central venous line is identified with distal tip in the right atrium. The visualized skeletal structures are unremarkable. IMPRESSION: Pulmonary edema. Electronically Signed   By: Abelardo Diesel M.D.   On: 09/02/2017 20:34    STUDIES:  CXR 2/24 >  mild interstitial edema. Echo 2/26 > EF 35%, severe ant/AS/apical & inferior hypokinesis LHC 2/26 >   SIGNIFICANT EVENTS  2/25 > admit.  ASSESSMENT / PLAN:  Appears to be pulmonary edema, not improved by HD, remains severely orthopneic, If ischemic must be proximal lesion - Given severe distress best to proceed with mech ventilation   Acute resp failure - Intubated -Vent settings reviewed & adjusted   NSTEMI - now with worsening chest  pain, rated at 10/10. Acute pulmonary edema- EF 35%  Plan: Cardiology following, plan for [facility] 2/26. Continue heparin gtt. Lopressor 5mg  q6h prn if BP permits Morphine PRN.  Hypotension - presumed due to above + worsened by nitro , ? Cardiogenic shock Plan: Neosynephrine for goal MAP > 65. Place a -line, levophed once CVL placed  UTI - cultures pending. Plan: Continue empiric abx. Follow cultures.  Recent PE - was on eliquis.  Per report has not missed any doses; therefore, low likelihood of increased clot burden. Plan: Continue heparin gtt in lieu of preadmission eliquis.  Hemoptysis - new since in ED.  ? Related to PE vs undiagnosed [place]ers. Plan: Assess ANCA. Continue heparin gtt for now unless hemoptysis worsens. Consider repeat CT scan, unable to do now as pt can not lie flat.  ESRD (M/W/F). Plan: Renal following, last HD 2/26 am  Hx hypothyroidism. Plan: Continue synthroid, change to IV formulation.  Hx DM - diet controlled. Plan: SSI if glucose consistently > 180.  Hx Scleroderma. Plan: Continue cellcept, stress dose steroids instead of prednisone. F[facility]u with [facility]ology at [facility].  Updated family  The patient is critically ill with multiple organ systems failure and requires high complexity decision making for assessment and support, frequent evaluation and titration of therapies, application of advanced monitoring technologies and extensive interpretation of multiple databases. Critical Care Time devoted to patient care services described in this note independent of APP/resident  time is 65 minutes.    Kara Mead MD. [place] Flood. [place] [facility] Pager [phone] If no response call [phone]   09-24-17    2017-09-24, 8:36 AM

## 2017-09-06 NOTE — Progress Notes (Signed)
Continuing to care for this family as they grieve the sort of sudden death of their loved one.  Several family members present.  Hospitality provided.   Will pass along to daytime chaplains just in case further support is needed.  Prayer with family.    2017/09/06 1204  Clinical Encounter Type  Visited With Patient and family together;Health care provider  Visit Type Follow-up;Spiritual support;Death  Spiritual Encounters  Spiritual Needs Grief support;Emotional;Prayer

## 2017-09-06 NOTE — Progress Notes (Signed)
Code blue called and CPR initiated. Patient manually bagged with 100% O2. Return of ROSC. Patient placed back on ventilator with increased settings per DR Elsworth Soho. NP now attempting A-Line insertion.

## 2017-09-06 NOTE — Progress Notes (Addendum)
Progress Note  Patient Name: Rebecca Mullins Date of Encounter: September 24, 2017 Primary Cardiologist: Dr. Danise Mina ([facility])  Subjective   Hemoptysis with clots yesterday evening. Tachycardic, tachypnea to 50's and hypotensive as well early this morning requiring pressure support with neo. Transferred to ICU.   CXR with pulmonary edema although HD ended early this AM due to BP instability.   Code blue this morning requiring brief CPR/epi/bicarb. Transitioned to Levo. Intubated & taken emergently to cath lab  Inpatient Medications    Scheduled Meds: . [MAR Hold] aspirin  324 mg Oral Once  . aspirin  81 mg Oral Pre-Cath  . [MAR Hold] doxercalciferol  3 mcg Intravenous Q M,W,F-HD  . fentaNYL      . [MAR Hold] hydrocortisone sod succinate (SOLU-CORTEF) inj  100 mg Intravenous Q8H  . [MAR Hold] levothyroxine  44 mcg Intravenous Daily  . [MAR Hold] mouth rinse  15 mL Mouth Rinse BID  . midazolam      . [MAR Hold] multivitamin  1 tablet Oral QHS  . [MAR Hold] mycophenolate  1,500 mg Oral Q12H  . [MAR Hold] sodium bicarbonate  100 mEq Intravenous Once  . sodium chloride flush  3 mL Intravenous Q12H   Continuous Infusions: . sodium chloride 50 mL/hr at 09/02/17 0207  . sodium chloride    . sodium chloride Stopped (09-24-17 0600)  . [MAR Hold] sodium chloride    . [MAR Hold] sodium chloride    . [MAR Hold] aztreonam Stopped (09-24-2017 0104)  . fentaNYL infusion INTRAVENOUS    . heparin Stopped (Sep 24, 2017 0736)  . [MAR Hold] norepinephrine (LEVOPHED) Adult infusion 30 mcg/min (09-24-2017 1025)  . [MAR Hold] phenylephrine (NEO-SYNEPHRINE) Adult infusion    .  sodium bicarbonate  infusion 1000 mL 125 mL/hr at 09/24/2017 0949  . [MAR Hold] vancomycin     PRN Meds: sodium chloride, [MAR Hold] sodium chloride, [MAR Hold] sodium chloride, [MAR Hold] acetaminophen, [MAR Hold] alteplase, EPINEPHrine, [MAR Hold] heparin, [MAR Hold] hydrALAZINE, [MAR Hold] levalbuterol, [MAR Hold] lidocaine (PF),  lidocaine (PF), [MAR Hold] lidocaine-prilocaine, [MAR Hold] midazolam, [MAR Hold] midazolam, [MAR Hold]  morphine injection, [MAR Hold] ondansetron (ZOFRAN) IV, [MAR Hold] pentafluoroprop-tetrafluoroeth, sodium bicarbonate, sodium chloride flush, [MAR Hold] zolpidem   Vital Signs    Vitals:   September 24, 2017 0845 Sep 24, 2017 0900 2017/09/24 0915 2017/09/24 0930  BP: (!) 86/27 (!) 85/64 (!) 73/39 (!) 82/74  Pulse:      Resp: 11 (!) 5 (!) 35 (!) 35  Temp:      TempSrc:      SpO2:      Weight:      Height:        Intake/Output Summary (Last 24 hours) at 09/24/17 1041 Last data filed at Sep 24, 2017 0700 Gross per 24 hour  Intake 850.97 ml  Output 580 ml  Net 270.97 ml   Filed Weights   09/02/17 2330 09/24/2017 0130 09/24/2017 0515  Weight: 194 lb 7.1 oz (88.2 kg) 194 lb 7.1 oz (88.2 kg) 194 lb 7.1 oz (88.2 kg)    Telemetry    Sinus tachy- Personally Reviewed   ECG    SR with TWI in anterolateral leads - Personally Reviewed  Physical Exam   General: Chronically-ill and critically-ill appearing AA female sitting upright in bed. Very anxious, diaphoretic.  Head: Taught facial skin. Atraumatic.  Neck: +JVD.  Lungs: accessory muscle use. Shallow respirations. Scattered crackles throughout.  Heart: Tachycardic. No murmur. HD cath in right upper chest.  Abdomen: Soft, non-tender. +bs Extremities: Skin  c/w scleroderma. Scaling throughout. Chronic LE edema. Ulcerations on digits of BL hands. +ulceration foot.  Neuro: Alert and oriented X 3. Moves all extremities spontaneously.  Labs    Chemistry Recent Labs  Lab 08/31/2017 2315 09/02/17 0222 Sep 13, 2017 0004 2017-09-13 0429  NA 138 135 137 139  K 3.9 5.2* 5.3* 3.2*  CL 98* 99* 101 100*  CO2 20* 17* 14* 23  GLUCOSE 153* 150* 174* 144*  BUN 61* 61* 76* 22*  CREATININE 9.61* 9.62* 10.23* 3.60*  CALCIUM 9.4 8.8* 8.8* 8.4*  PROT 8.4* 7.3  --   --   ALBUMIN 3.7 3.3* 3.2*  --   AST 41 71*  --   --   ALT 28 34  --   --   ALKPHOS 174* 165*  --    --   BILITOT 0.5 1.2  --   --   GFRNONAA 4* 4* 4* 15*  GFRAA 5* 5* 5* 17*  ANIONGAP 20* 19* 22* 16*     Hematology Recent Labs  Lab 08/22/2017 2315 2017/09/13 0004 2017-09-13 0429  WBC 23.3* 29.1* 37.7*  RBC 4.21 3.88 3.53*  HGB 11.9* 10.6* 9.7*  HCT 37.7 35.7* 31.1*  MCV 89.5 92.0 88.1  MCH 28.3 27.3 27.5  MCHC 31.6 29.7* 31.2  RDW 16.2* 17.1* 16.8*  PLT 321 345 454*    Cardiac Enzymes Recent Labs  Lab 09/02/17 0222 09/02/17 0857 09/02/17 1413  TROPONINI 4.78* 30.34* 29.59*    Recent Labs  Lab 08/25/2017 2331  TROPIPOC 0.88*     BNP Recent Labs  Lab 09-13-2017 0429  BNP 1,758.5*     DDimer No results for input(s): DDIMER in the last 168 hours.    Radiology    Dg Chest 2 View  Result Date: 08/29/2017 CLINICAL DATA:  42 year old female with tachycardia and palpitation for EXAM: CHEST  2 VIEW COMPARISON:  Chest radiograph dated 07/17/2017 FINDINGS: Right-sided dialysis catheter with tip over the right atrium. There is mild interstitial edema similar or slightly worsened compared the prior radiograph. No focal consolidation, pleural effusion, or pneumothorax. The cardiac silhouette is within normal limits with no acute osseous pathology. IMPRESSION: 1. Mild interstitial edema.  No focal consolidation. 2. Right-sided dialysis catheter in stable position with tip in the right atrium. Electronically Signed   By: Anner Crete M.D.   On: 08/30/2017 22:55   Dg Chest Port 1 View  Result Date: 09/13/17 CLINICAL DATA:  ETT/CENTRAL LINE PLACEMENT EXAM: PORTABLE CHEST 1 VIEW COMPARISON:  09/02/2017 FINDINGS: Endotracheal tube extends into the proximal RIGHT mainstem bronchus. Recommend retraction by 3-5 cm. LEFT central venous line placed tip in the mid SVC. Large bore RIGHT central venous line tip RIGHT atrium. NG tube in stomach. Bilateral airspace disease suggests pulmonary edema.  Low volumes IMPRESSION: 1. Endotracheal tube within the proximal RIGHT mainstem bronchus.  Recommend retraction by 3 to 5 cm. 2. 3. Pulmonary edema pattern. 4. LEFT central venous line with tip in mid SVC. 5. NG tube in stomach. aThese results will be called to the ordering clinician or representative by the Radiologist Assistant, and communication documented in the PACS or zVision Dashboard. Electronically Signed   By: Suzy Bouchard M.D.   On: Sep 13, 2017 09:33   Dg Chest Port 1 View  Result Date: 09/02/2017 CLINICAL DATA:  Shortness of breath starting yesterday EXAM: PORTABLE CHEST 1 VIEW COMPARISON:  September 01, 2017 FINDINGS: The mediastinal contour is normal. The heart size is normal. There is pulmonary edema. There is no focal  pneumonia or pleural effusion. Right central venous line is identified with distal tip in the right atrium. The visualized skeletal structures are unremarkable. IMPRESSION: Pulmonary edema. Electronically Signed   By: Abelardo Diesel M.D.   On: 09/02/2017 20:34    Cardiac Studies   Cardiac MRI: 11/18 1. The left ventricle is normal in size with mild concentric left ventricular hypertrophy, in addition basal sigmoid septum seen. Global systolic function is normal, the LVEF is 63%. There is thinning and akinesia of the basal inferior wall and inferoseptum, as well as hypokinesia of the apical septum.  2. Due to renal failure, gadolinium contrast was not administered. Therefore, delayed enhancement imaging was not performed. Non-contrast imaging for tissue mapping without definitive abnormalities. FINAL IMPRESSION: Regional wall motion abnormalities noted, etiology not clear based on non-contrast images but systemic sclerosis included in the differential diagnosis.   TTE 09/02/17 Study Conclusions - Left ventricle: The cavity size was normal. Wall thickness was   increased in a pattern of moderate LVH. Systolic function was   moderately reduced. The estimated ejection fraction was in the   range of 35% to 40%. Severe anterior, anteroseptal, apical and    inferoapical hypokinesis to akinesis - suggestive of LAD   territory ischemia/infarct. The study is not technically   sufficient to allow evaluation of LV diastolic function. - Mitral valve: Mildly thickened leaflets . There was mild   regurgitation. - Left atrium: The atrium was normal in size. - Inferior vena cava: The vessel was dilated. The respirophasic   diameter changes were blunted (< 50%), consistent with elevated   central venous pressure. Urgent and Critical Findings:  A result from an emergently ordered study, LAD territory WMA, was reported to PCCM. Impressions: - LVEF 35-40%, severe anterior, anteroseptal, apical and   inferoapical hypokinesis to akinesis, mild MR, normal biatrial   size, dilated IVC.  Emergent LHC 2017-09-10:  Conclusion     Mid LM lesion is 80% stenosed.  Prox Cx lesion is 95% stenosed.  Ost 1st Mrg to 1st Mrg lesion is 70% stenosed.  Dist LM to Ost LAD lesion is 85% stenosed.  Prox RCA to Mid RCA lesion is 100% stenosed.  Hemodynamic findings consistent with severe pulmonary hypertension.   1. 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 Advanced cardiogenic shock with severe LV and RV failure.  3. Death in lab due to progressive PEA arrest.    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 Found to be Septic (UTI), grossly volume-overloaded with NSTEMI with troponin >30. She was given ASA and started on Heparin. Given sepsis and recent PE with [place] and Lovenox given in ED, LHC was scheduled for the following day.   Declined overnight with hemoptysis, tachypnea to 50's, hypotension requiring neo and eventually  levo, and tachycardia. Taken for HD but unable to tolerate entire session due to BP instability. She was ultimately intubated and coded 09-10-22 AM w/ successful resuscitation. She was taken emergently to cath lab which revealed left main and severe 3-vessel obstructive CAD, advanced cardiogenic shock with severe LV and RV failure and ultimately died in lab due to progressive PEA arrest despite resuscitative efforts.   Assessment &  Plan    1. Severe 3-vessel CAD/NSTEMI, Cardiogenic Shock: Patient with multiple severe comorbidities and overall grim prognosis. Patient unfortunately passed in cath lab following progressive PEA arrest. Cath showed left main and severe 3-vessel CAD with severe pulmonary hypertension. She was in advanced cardiogenic shock with severe LV and RV failure.   2. Septic Shock, UTI: WBC continues to trend upward despite broad spectrum abx. Is on chronic prednisone with WBC elevation to 13 at baseline. Presented with WBC 23, now 37.   3. PE: dx about a week ago and started on Eliquis as outpatient. Transitioned to IV heparin in anticipation of LHC.   4. ERSD on HD: via RIJ TDC as permanent access unable to be placed due to concern of digital ischemia related to scleroderma/sclerodactyly. 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 Unable to tolerate ED overnight/early AM due to BP instability although still with significant volume overload.   5. Scleroderma, Systemic Sclerosis: followed by rheumatology through [facility]. Features quite prominent on examination with facial skin tightness and sclerodactyly. Notes reviewed in Care Everywhere document a rapid decline over the past several months.   6. Hyperkalemia: Given kayexelate and received HD with improvement to 3.2 this morning.    7. HL: LDL above goal at 177  8. PAD: Digital ulcers present on examination. Recent dopplers noted R pSFA with >75% stenosis, and L SFA >75% dSFA. Noted a chronic right foot wound as well.   9.  DM: Diet controlled, A1c 5.6% this admission.  Signed, Bethany Molt, DO  09-21-17, 10:41 AM  Pager # [phone]   For questions or updates, please contact [place] Please consult [email or web address] for contact info under Cardiology/STEMI.  I have examined the patient and reviewed assessment and plan and discussed with patient.  Agree with above as stated.  Patient with respiratory distress and intubated.  Discussed with Dr. Elsworth Soho and Dr. Debara Pickett.  Subsequent Code Blue called and given anterior hypokinesis,  Cath was moved up on the schedule from 3 PM.  ECG c/w LAD lesion as well.  Further plans based on the cath.    Larae Grooms

## 2017-09-06 NOTE — Progress Notes (Signed)
Patient taken emergently to cath lab.

## 2017-09-06 NOTE — Progress Notes (Signed)
[place] for heparin Indication: pulmonary embolus vs ACS  Allergies  Allergen Reactions  . Penicillins Anaphylaxis    Has patient had a PCN reaction causing immediate rash, facial/tongue/throat swelling, SOB or lightheadedness with hypotension: Yes Has patient had a PCN reaction causing severe rash involving mucus membranes or skin necrosis: No Has patient had a PCN reaction that required hospitalization No Has patient had a PCN reaction occurring within the last 10 years: No If all of the above answers are "NO", then may proceed with Cephalosporin use.   . Nsaids     Kidney issues  . Oxycodone     Patient says unable to take this medicine.    Patient Measurements: Height: 5\' 2"  (157.5 cm) Weight: 194 lb 7.1 oz (88.2 kg) IBW/kg (Calculated) : 50.1 Heparin Dosing Weight: 71 kg  Vital Signs: Temp: 97.9 F (36.6 C) (02/26 0515) Temp Source: Oral (02/26 0515) BP: 78/62 (02/26 0515) Pulse Rate: 112 (02/26 0500)  Labs: Recent Labs    08/24/2017 2315 09/02/17 0222 09/02/17 0857 09/02/17 1413 2017-09-14 0004 September 14, 2017 0427 14-Sep-2017 0429  HGB 11.9*  --   --   --  10.6*  --  9.7*  HCT 37.7  --   --   --  35.7*  --  31.1*  PLT 321  --   --   --  345  --  454*  APTT  --  40*  --   --   --  87*  --   LABPROT  --  15.5*  --   --   --   --   --   INR  --  1.24  --   --   --   --   --   HEPARINUNFRC  --   --   --   --   --   --  1.64*  CREATININE 9.61* 9.62*  --   --  10.23*  --  3.60*  TROPONINI  --  4.78* 30.34* 29.59*  --   --   --      Assessment: Admitted with SOB, chest pain and emesis x1 episode.  She is ESRD on HD.  Patient is on Eliquis prior to admission for history of PE Heparin level is high this morning - as a result of Eliquis (last dose was yesterday morning), aPTT is therapeutic. hgb is down from admit, plts wnl.  Goal of Therapy:  Heparin level 0.3-0.7 units/ml Monitor platelets by anticoagulation protocol: Yes     Plan:  -Continue heparin at 1150 units/hr -Daily HL, CBC, aPTT -Confirmatory level this afternoon   Harvel Quale 14-Sep-2017,5:57 AM

## 2017-09-06 NOTE — Progress Notes (Signed)
Responded to Code Blue for this patient.  Boyfriend, sister, brother, sister-in law and niece all present and grieving wondering why patient had to be intubated.  Space allowed for family members to express their feelings about what is happening.  Emotional support and prayer provided for the family around this situation with the code blue this morning.  Sister in law understands the patient is weak and sick.  She has been trying to help the others to understand.  Will remain available for support.    2017-09-18 0930  Clinical Encounter Type  Visited With Health care provider;Patient not available;Family  Visit Type Initial;Follow-up;Spiritual support;Code;Critical Care  Referral From Nurse  Consult/Referral To Chaplain  Spiritual Encounters  Spiritual Needs Prayer;Emotional

## 2017-09-06 NOTE — Progress Notes (Signed)
Tube withdrawn 2cm per MD order. Tube now secured at 23cm. Will continue to monitor patient. Patient is preparing to transport to Cath Lab.

## 2017-09-06 NOTE — Progress Notes (Signed)
She was intubated uneventfully.  Central line and arterial line was obtained.  ABG showed severe metabolic plus respiratory acidosis, tidal volume was increased to 500 and respiratory rate was increased to 35 and repeat ABG showed improvement of pH from 6.85-7.15. Chest x-ray was reviewed and ET tube was withdrawn 2 cm. She sustained a brief PEA arrest and she was successfully resuscitated with epinephrine x1. I discussed with cardiology and decision was made to emergently proceed to cardiac cath. Family was updated in detail. I was then called the Cath Lab due to severe hypotension and in fact epinephrine had been administered with rebound blood pressure.  She required 2-3 rounds of epinephrine during the procedure. I reviewed Results with cardiology.  Advanced heart failure was called to the bedside due to severe RV and LV failure and I reviewed and discussed possible options with them. She was not felt to be a candidate. She then sustained another cardiac arrest after the procedure but still in the Cath Lab when was not resuscitated at this time. Dr. Martinique and I broke the news to the family.  Additional critical care time independent of procedures x 90 mins  Hugh Garrow V. Elsworth Soho MD

## 2017-09-06 NOTE — Progress Notes (Signed)
Pt admitted to unit. CCM paged ahead of pt arrival to come to bedside and assess for possible need to intubate. Pt arrived to unit, tachypneic (RR 40s), SpO2 99% on 4LNC, BP 91/61 map 68. Pt is drowsy but answers questions appropriately, does get short of breath with talking. Pt encouraged to control breathing and slow it down. Lung sounds clear and diminished across all lung fields. CCM at bedside provided orders, pt remains on 4L [place] at this time.

## 2017-09-06 NOTE — Progress Notes (Signed)
Patient intubated by NP Minor with MD Elsworth Soho at bedside. Patient placed on vent with ordered settings. Tube secured at 25. CXR ordered.

## 2017-09-06 NOTE — Progress Notes (Signed)
Dr. Elsworth Soho notified and asked to come to bedside to see patient.  Will be on his way.

## 2017-09-06 NOTE — Procedures (Addendum)
Central Venous Catheter Insertion Procedure Note Rebecca Mullins [id number] [birth year]  Procedure: Insertion of Central Venous Catheter Indications: Assessment of intravascular volume, Drug and/or fluid administration and Frequent blood sampling  Procedure Details Consent: Risks of procedure as well as the alternatives and risks of each were explained to the (patient/caregiver).  Consent for procedure obtained. Time Out: Verified patient identification, verified procedure, site/side was marked, verified correct patient position, special equipment/implants available, medications/allergies/relevent history reviewed, required imaging and test results available.  Performed  Maximum sterile technique was used including antiseptics, cap, gloves, gown, hand hygiene, mask and sheet. Skin prep: Chlorhexidine; local anesthetic administered A antimicrobial bonded/coated triple lumen catheter was placed in the left internal jugular vein using the Seldinger technique. Ultrasound guidance used.Yes.   Catheter placed to 20 cm. Blood aspirated via all 3 ports and then flushed x 3. Line sutured x 2 and dressing applied.  Evaluation Blood flow good Complications: No apparent complications Patient did tolerate procedure well. Chest X-ray ordered to verify placement.  CXR: pending.  Left I Rebecca Mullins Rebecca Mullins ACNP Rebecca Mullins PCCM Pager [phone] till 1 pm If no answer page [phone] 09/29/17, 9:09 AM

## 2017-09-06 NOTE — Plan of Care (Signed)
  Progressing Education: Knowledge of General Education information will improve September 07, 2017 0315 - Progressing by Alonna Buckler, RN Health Behavior/Discharge Planning: Ability to manage health-related needs will improve 2017-09-07 0315 - Progressing by Alonna Buckler, RN Clinical Measurements: Ability to maintain clinical measurements within normal limits will improve 09/07/2017 0315 - Progressing by Alonna Buckler, RN Will remain free from infection September 07, 2017 0315 - Progressing by Alonna Buckler, RN Diagnostic test results will improve September 07, 2017 0315 - Progressing by Alonna Buckler, RN Respiratory complications will improve 07-Sep-2017 0315 - Progressing by Alonna Buckler, RN Cardiovascular complication will be avoided September 07, 2017 0315 - Progressing by Alonna Buckler, RN Activity: Risk for activity intolerance will decrease 09-07-17 0315 - Progressing by Alonna Buckler, RN Nutrition: Adequate nutrition will be maintained 09/07/2017 0315 - Progressing by Alonna Buckler, RN Coping: Level of anxiety will decrease 2017/09/07 0315 - Progressing by Alonna Buckler, RN Elimination: Will not experience complications related to bowel motility Sep 07, 2017 0315 - Progressing by Alonna Buckler, RN Will not experience complications related to urinary retention 2017/09/07 0315 - Progressing by Alonna Buckler, RN Pain Managment: General experience of comfort will improve Sep 07, 2017 0315 - Progressing by Alonna Buckler, RN Safety: Ability to remain free from injury will improve 09-07-17 0315 - Progressing by Alonna Buckler, RN Skin Integrity: Risk for impaired skin integrity will decrease 09-07-2017 0315 - Progressing by Alonna Buckler, RN Education: Understanding of CV disease, CV risk reduction, and recovery process will improve September 07, 2017 0315 - Progressing by Alonna Buckler, RN Cardiovascular: Ability to achieve and maintain adequate cardiovascular  perfusion will improve 07-Sep-2017 0315 - Progressing by Alonna Buckler, RN Health Behavior/Discharge Planning: Ability to safely manage health-related needs after discharge will improve 2017/09/07 0315 - Progressing by Shaquela Weichert, Caryn Bee, RN

## 2017-09-06 NOTE — Progress Notes (Signed)
    ADVANCED HF CONSULT TEAM  We were called to the cath lab by Dr. Martinique to see Rebecca Mullins emergently for further management of cardiogenic shock and possible mechanical support.   Rebecca Mullins is a 42 y/o woman with DM2, scleroderma, ILD, ESRD (s/p HD initiation 2 months ago at [facility]) and recent PE admitted on 2/25 with CP and respiratory distress.   Echo 2/25 EF 35-40% with mild RV dysfunction (Personally reviewed)  Experienced cardio-respiratory arrest earlier this am and underwent CPR. Taken emergently to cath lab  Cath showed severe 3v CAD. With LM 75%, LAD ostial 85% LCX 95% RCA 100%. (no clear options for immediate revascularization)  Hemodynamics c/w with severe biventricular failure with PCWP > 40  PAPi < 0.7 despite multiple pressor support including epi drip.  Was persistently acidotic and hypoxic despite bicarb drip and full vent support with ph 7.0 and PCO2 > 70.   With biventricular failure and inability to oxygenate patient not candidate for [place]ella and with ESRD and other comorbidities not candidate for ECMO.   On exam intubated and non responsive Tachy with prominent S3 Lungs + crackles RU chest perm-cath Ab distended NT Extremities cool/mottled  Discussed with Drs. Martinique and [facility]. There is no option for mechanical support. Would continue supportive care however, unfortunately, there is little chance she can survive the next 6 hours.   CRITICAL CARE Performed by: Glori Bickers  Total critical care time: 35 minutes  Critical care time was exclusive of separately billable procedures and treating other patients.  Critical care was necessary to treat or prevent imminent or life-threatening deterioration.  Critical care was time spent personally by me (independent of midlevel providers or residents) on the following activities: development of treatment plan with patient and/or surrogate as well as nursing, discussions with consultants, evaluation of patient's  response to treatment, examination of patient, obtaining history from patient or surrogate, ordering and performing treatments and interventions, ordering and review of laboratory studies, ordering and review of radiographic studies, pulse oximetry and re-evaluation of patient's condition.  Glori Bickers, MD  3:31 PM

## 2017-09-06 NOTE — Discharge Summary (Addendum)
Name: Rebecca Mullins MRN: [id number] DOB: 12/27/1975    ADMISSION DATE:  08/19/2017 CONSULTATION DATE:  2/225/19  REFERRING MD :  Fudim  CHIEF COMPLAINT:  Chest pain   HISTORY OF PRESENT ILLNESS:  [name] is a 42 y.o. 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  She presented to [facility] 2/24 with chest pain and SOB and was found to have NSTEMI.  She was admitted early AM 2/25 and was seen by cardiology in consultation.   Later that evening, PCCM was asked to see in consultation due to tachycardia and increased WOB.  She  had 2 episodes of hemoptysis since being in the ED.    ETT 2/26 >> [facility] 2/26 Rt fem a line 2/26 >>   STUDIES:  CXR 2/24 > mild interstitial edema. Echo 2/26 > EF 35%, severe ant/AS/apical & inferior hypokinesis LHC 2/26 > RCA occlusion, left main disease Severe PH ,   SIGNIFICANT EVENTS  2/25 > admit.  COURSE:  She was in extremis morning of 2/26 and was intubated, then transferred to the Cath Lab for emergent cath.  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 She sustained cardiac arrest in the Cath Lab unfortunately failed resuscitation and passed away. Incidentally beta-hCG was 15, discussed with GYN, unclear significance without trend, could be recent miscarriage or could be very early pregnancy.  Discussed with medical examiner-no autopsy advised  Cause of death-cardiogenic shock, three-vessel coronary artery disease, severe pulmonary hypertension, scleroderma, recent pulmonary embolism, end-stage renal disease   Shawnee Higham V. Elsworth Soho MD  09-15-2017, 12:20 PM

## 2017-09-06 NOTE — Procedures (Signed)
Arterial Catheter Insertion Procedure Note Rebecca Mullins [id number] [birth year]  Procedure: Insertion of Arterial Catheter  Indications: Blood pressure monitoring  Procedure Details Consent: Risks of procedure as well as the alternatives and risks of each were explained to the (patient/caregiver).  Consent for procedure obtained. Time Out: Verified patient identification, verified procedure, site/side was marked, verified correct patient position, special equipment/implants available, medications/allergies/relevent history reviewed, required imaging and test results available.  Performed  Maximum sterile technique was used including antiseptics, cap, gloves, gown, hand hygiene, mask and sheet. Skin prep: Chlorhexidine; local anesthetic administered 20 gauge catheter was inserted into right radial artery using the Seldinger technique.  Evaluation Blood flow good; BP tracing good. Complications: No apparent complications.   Blanchie Serve 2017-09-07

## 2017-09-06 NOTE — Procedures (Signed)
Intubation Procedure Note Rebecca Mullins [id number] 08/13/1975  Procedure: Intubation Indications: Respiratory insufficiency  Procedure Details Consent: Risks of procedure as well as the alternatives and risks of each were explained to the (patient/caregiver).  Consent for procedure obtained. Time Out: Verified patient identification, verified procedure, site/side was marked, verified correct patient position, special equipment/implants available, medications/allergies/relevent history reviewed, required imaging and test results available.  Performed  MAC and 3 Medications:  Fentanyl  Etomidate Versed NMB    Evaluation Hemodynamic Status: Transient hypotension treated with pressors; O2 sats: stable throughout Patient's Current Condition: stable Complications: No apparent complications Patient did tolerate procedure well. Chest X-ray ordered to verify placement.  CXR: pending.   Richardson Landry Carvin Almas ACNP Maryanna Shape PCCM Pager [phone] till 3 pm If no answer page [phone] Sep 21, 2017, 9:15 AM

## 2017-09-06 NOTE — Progress Notes (Signed)
CCM NP at bedside presently to assess pt as pt has been coughing up small amounts of blood and bloody clots. Pt HR sinus 120s-130s, RR 40s-50s, BP 75/60 (map 63). Pt continues to report shortness of breath and appears uncomfortable. RT at bedside, unable to place left arterial line due to anatomy and poor vasculature. MD aware, defer to day shift MDs when they round. No new orders at this time.

## 2017-09-06 NOTE — Progress Notes (Signed)
[facility] Progress Note Patient Name: Rebecca Mullins DOB: Apr 04, 1976 MRN: [id number]   Date of Service  09-28-17  HPI/Events of Note  K+ = 3.2 - Patient just finished hemodialysis.   eICU Interventions  Will not replace K+ d/t renal failure.      Intervention Category Major Interventions: Electrolyte abnormality - evaluation and management  Cricket Goodlin Eugene 09/28/17, 6:20 AM

## 2017-09-06 NOTE — Progress Notes (Signed)
Dr. Elsworth Soho paged to come to bedside ASAP. MD reports he is otw.

## 2017-09-06 NOTE — Progress Notes (Signed)
Technical Description:  - CPR performance duration:  53mins   - Was defibrillation or cardioversion used?  NO   - Was external pacer placed? No  - Was patient intubated pre/post CPR? Yes    Post CPR evaluation:  - Final Status - Was patient successfully resuscitated ?  Yes - What is current rhythm?  Sinus tach - What is current hemodynamic status?  On levo & neo gtt  Miscellaneous Information:  - Labs sent, including: CBC, CMET, INR, ABG, lactic   - Primary team notified?    - Family Notified? Yes  - Additional notes/ transfer status:    Favio Moder V. Elsworth Soho MD

## 2017-09-06 NOTE — Progress Notes (Signed)
CCM called to get orders for arterial line as cuff BPs are very unreliable and labile, unable to titrate neo-synephrine with much accuracy. Order received, line placed by RT. However, an hour later, pt called RN into room as a-line had come out. Line removed completely by this RN and RT, pressure held, bleeding ceased, pressure dressing placed. Pt requested a wait period before attempting again due to feeling short of breath again). Impressed upon the patient the urgency of situation and asked pt to reconsider. RT at the bedside presently attempting another a-line, it is 0630. CCM aware.

## 2017-09-06 NOTE — Progress Notes (Signed)
Notified by cardiology service, (Dr Debara Pickett), that cardiology to ask for PCCM consult as pt now has had 2 episodes of hemoptysis, persistent tachycardia and increased WOB. Will notify flow manager's office regarding change in pt's primary service.   Rebecca Mullins. NP- [facility] Pager [phone]

## 2017-09-06 NOTE — Procedures (Signed)
Arterial Catheter Insertion Procedure Note Rebecca Mullins [id number] 05/13/1976  Procedure: Insertion of Arterial Catheter  Indications: Blood pressure monitoring and Frequent blood sampling  Procedure Details Consent: Risks of procedure as well as the alternatives and risks of each were explained to the (patient/caregiver).  Consent for procedure obtained. Time Out: Verified patient identification, verified procedure, site/side was marked, verified correct patient position, special equipment/implants available, medications/allergies/relevent history reviewed, required imaging and test results available.  Performed  Maximum sterile technique was used including antiseptics, cap, gloves, gown, hand hygiene, mask and sheet. Skin prep: Chlorhexidine; local anesthetic administered 20 gauge catheter was inserted into right femoral artery using the Seldinger technique.  Evaluation Blood flow good; BP tracing good. Complications: No apparent complications.   Richardson Landry Minor ACNP Maryanna Shape PCCM Pager [phone] till 3 pm If no answer page [phone] 09-12-2017, 9:12 AM

## 2017-09-06 NOTE — Progress Notes (Signed)
Patient coded after Central line placed.  Please see [facility] record for medications.

## 2017-09-06 NOTE — Progress Notes (Signed)
[facility] Progress Note Patient Name: Rebecca Mullins DOB: [birth year] MRN: [id number]   Date of Service  09/27/17  HPI/Events of Note  Hypotension - Unable to get reliable BP. Request for A-line.   eICU Interventions  Respiratory Therapy to place A-line.      Intervention Category Major Interventions: Hypotension - evaluation and management  Kendle Turbin Eugene 09/27/2017, 3:24 AM

## 2017-09-06 NOTE — Progress Notes (Signed)
Spoke with HD RN about when pt is going to get her HD tx tonight. Per RN, pt is next on list, she is finishing up with a patient and will be down as soon as she can.

## 2017-09-06 NOTE — Progress Notes (Signed)
Called to room by RN stating patient's breathing is labored with rate in the 50s. HR 130. Spo2 100%. Bipap setup and process explained to patient. Patient refuses to wear bipap d/t anxiety. RN at bedside giving medication for anxiety. Will continue to monitor. Pt on 4lpm nasal cannula.

## 2017-09-06 NOTE — Progress Notes (Signed)
Hd tx initiated via HD cath w/o problem other than pulling a large clot from arterial lumen/port, after that, pull/push/flush equally w/o problem, VSS but w/ soft bp and on neo drip to help support bp while on HD tx, will cont to monitor while on HD tx

## 2017-09-06 DEATH — deceased

## 2017-09-07 LAB — CULTURE, BLOOD (ROUTINE X 2)
Culture: NO GROWTH
Culture: NO GROWTH
Special Requests: ADEQUATE
Special Requests: ADEQUATE

## 2017-09-10 ENCOUNTER — Ambulatory Visit: Payer: Medicaid Other | Admitting: Obstetrics & Gynecology

## 2017-09-17 ENCOUNTER — Ambulatory Visit: Payer: Medicaid Other

## 2017-09-23 ENCOUNTER — Telehealth: Payer: Self-pay

## 2017-09-23 NOTE — Telephone Encounter (Signed)
On 09/23/17 I received a d/c from [facility] (original). The d/c is for burial. The patient is a patient of Doctor Elsworth Soho. The d/c will be taken to Pulmonary Unit @ [facility] for signature.  On 09/24/17 I received the d/c back from Doctor Elsworth Soho. I got the d/c ready and called the funeral home to let them know the d/c is ready for pickup.

## 2017-12-17 IMAGING — CR DG FOOT COMPLETE 3+V*R*
3 series · 3 of 3 positions shown · non-contrast
Comparison: None.

CLINICAL DATA: Pain, swelling, and skin breakdown along the lateral
aspect of the right foot for the past 3 weeks.

EXAM:
RIGHT FOOT COMPLETE - 3+ VIEW

[x foot ap right]
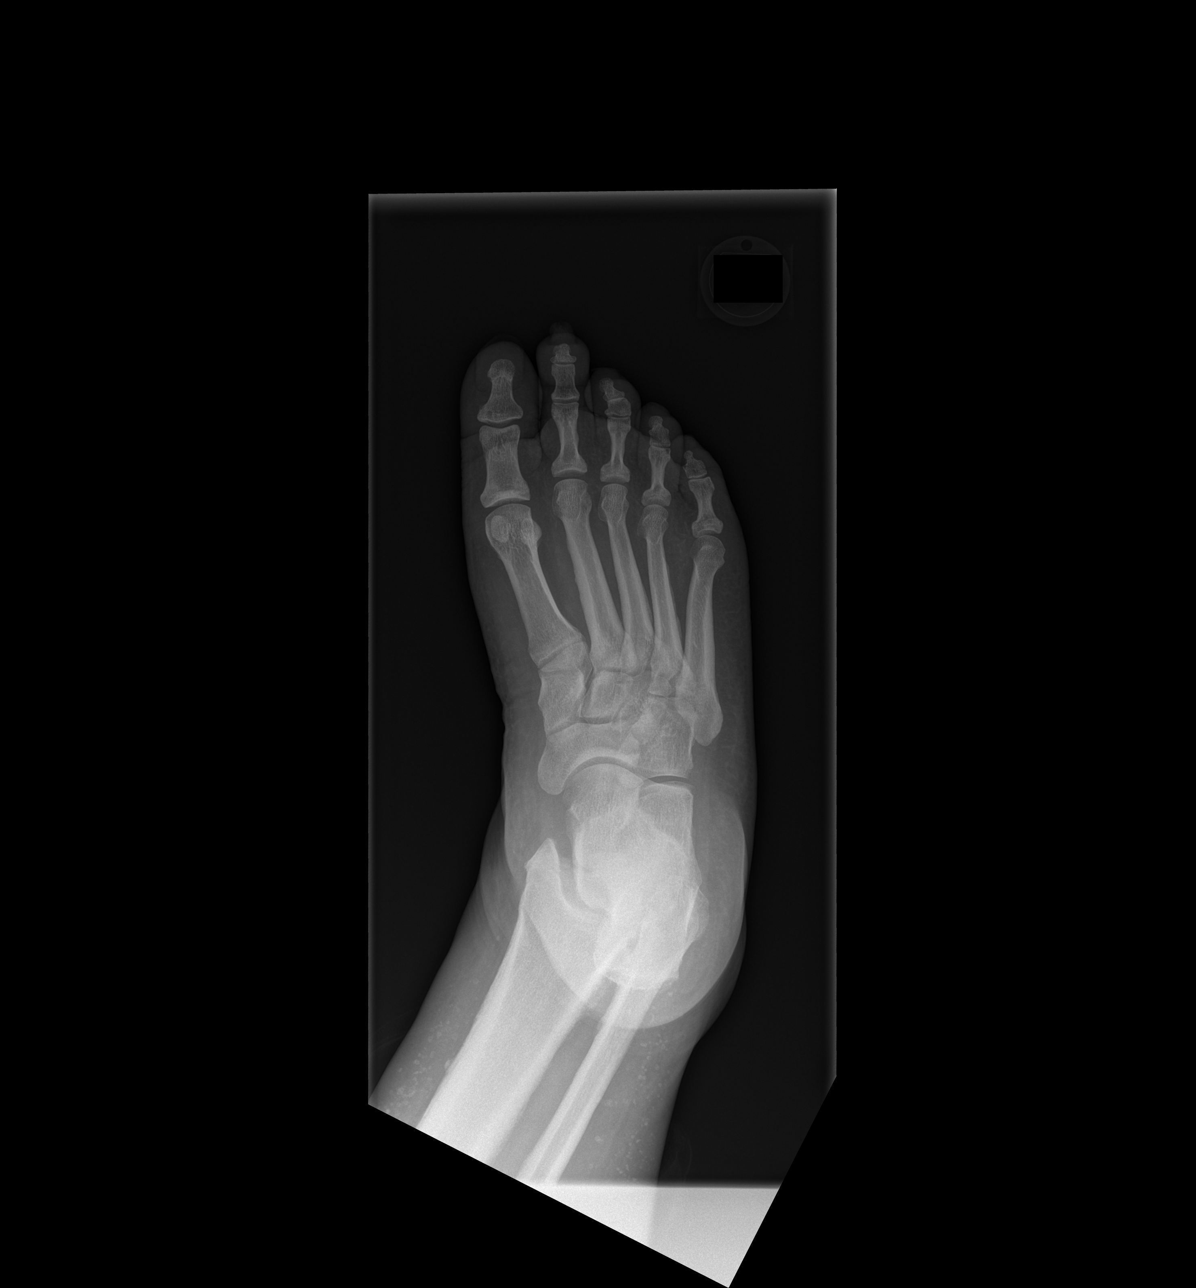

[x foot obl right]
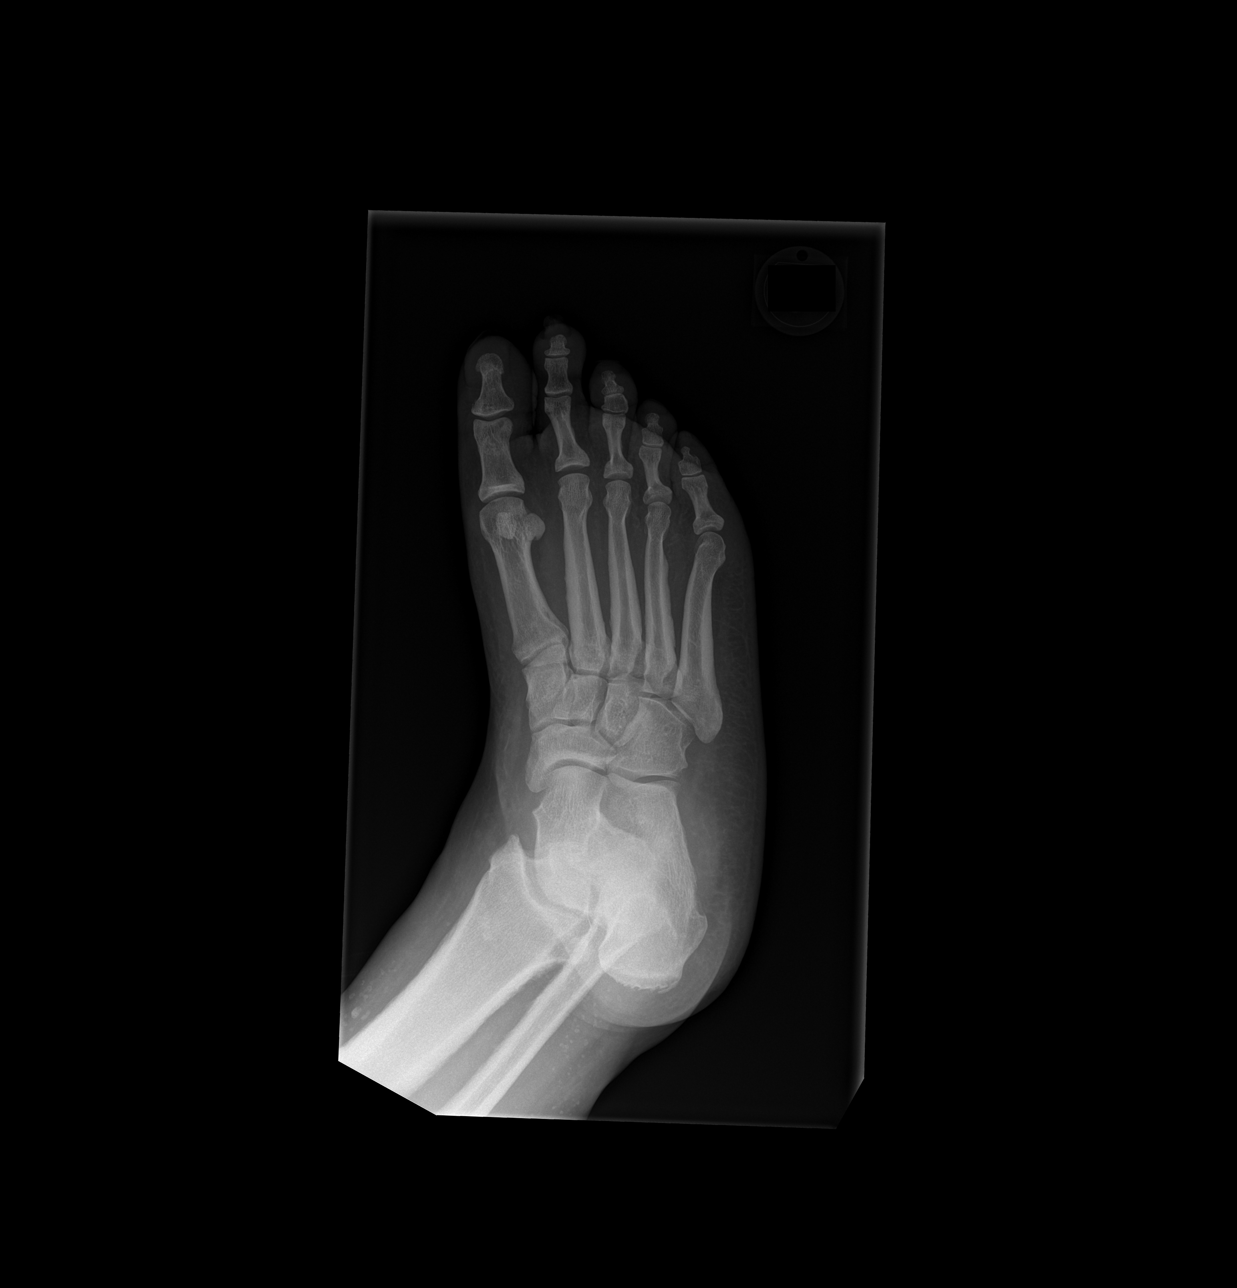

[x foot lat right]
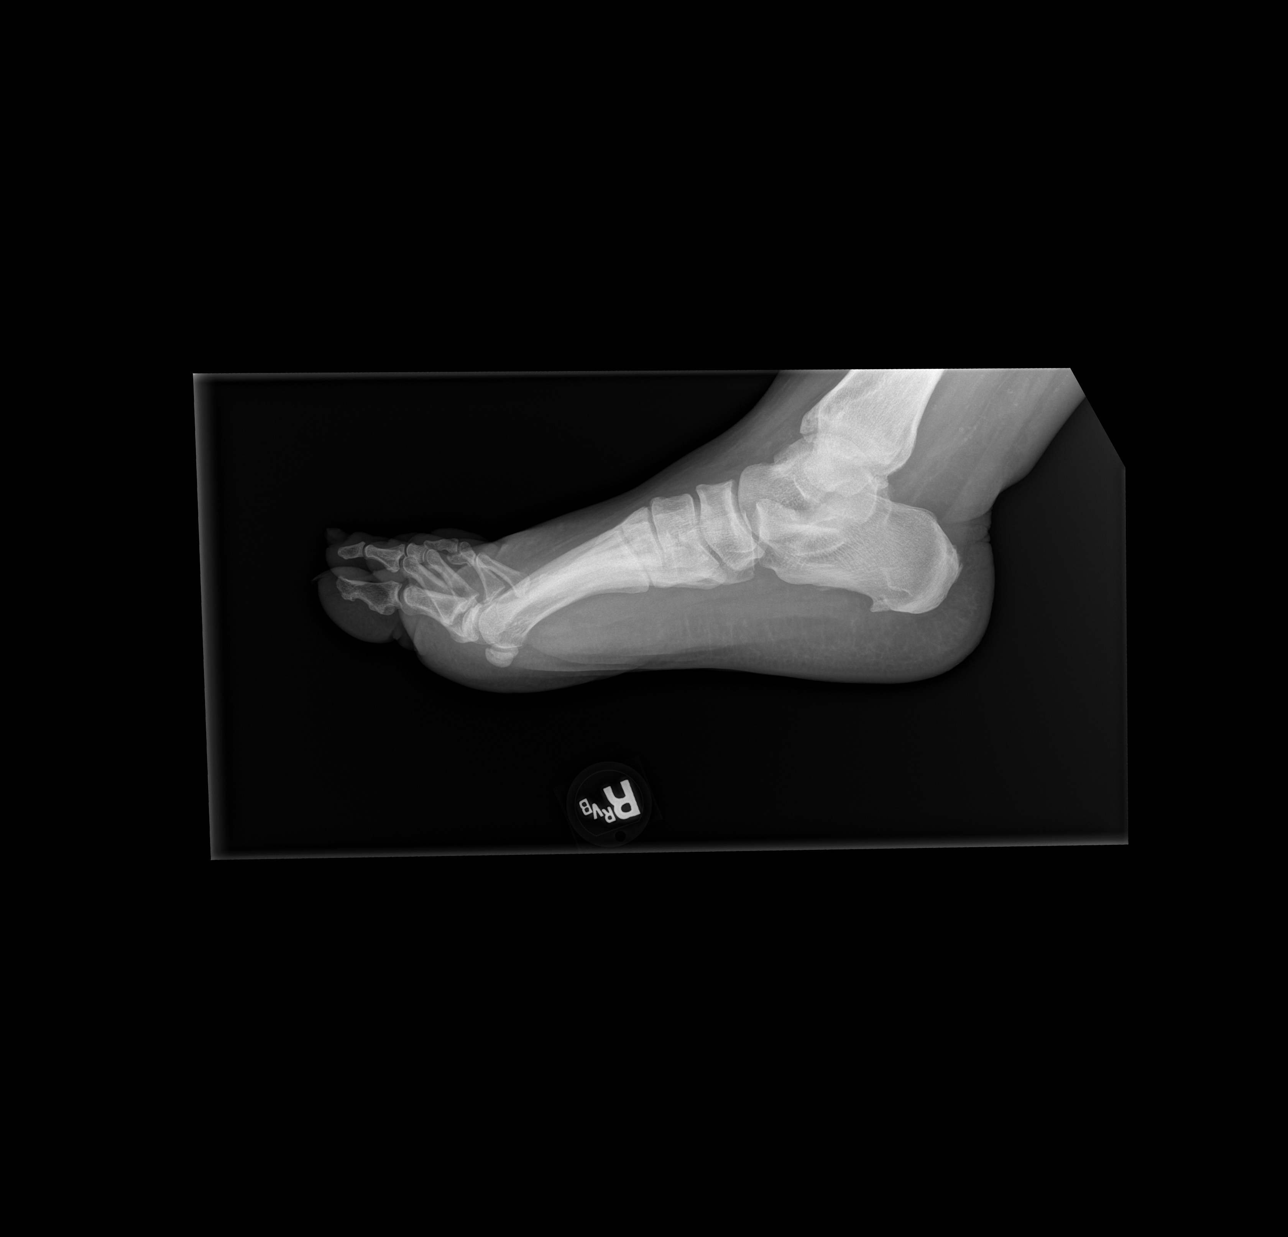

[3 of 3 positions shown; findings below may reference images not displayed]

FINDINGS: There is no evidence of fracture or dislocation. There is no
evidence of arthropathy or other focal bone abnormality. Plantar
enthesopathy. Soft tissues are unremarkable.
IMPRESSION: Negative.

## 2018-01-22 IMAGING — DX DG CHEST 1V PORT
1 series · 1 of 1 positions shown · non-contrast
Comparison: Chest x-ray 04/21/2015.

CLINICAL DATA: 41-year-old female with history of dialysis catheter
insertion. Evaluate for fluid overload.

EXAM:
PORTABLE CHEST 1 VIEW

[chest ap]
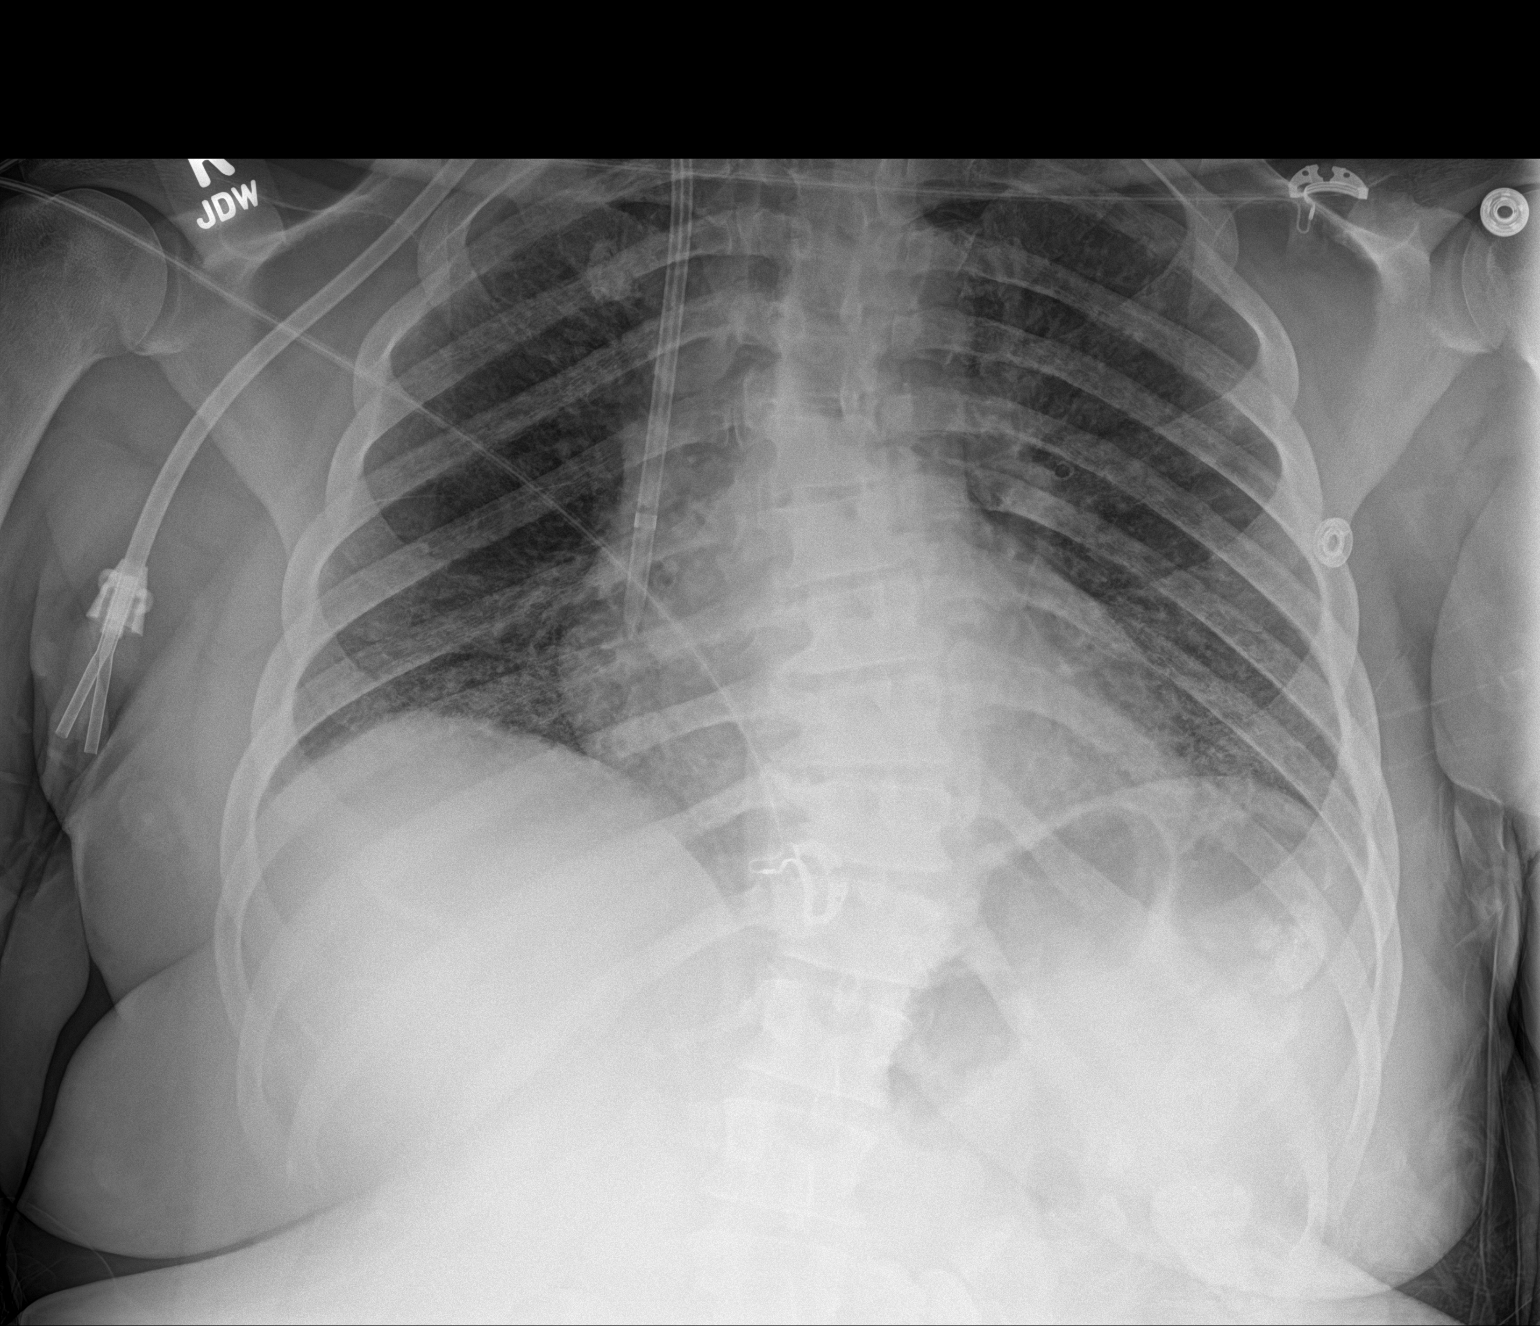

[1 of 1 positions shown; findings below may reference images not displayed]

FINDINGS: New right internal jugular PermCath with tip terminating at the
superior cavoatrial junction. Lung volumes are low. There is
cephalization of the pulmonary vasculature and slight indistinctness
of the interstitial markings suggestive of mild pulmonary edema.
Mild cardiomegaly. Upper mediastinal contours are within normal
limits. No pneumothorax.
IMPRESSION: 1. Mild interstitial pulmonary edema.
2. New right internal jugular PermCath with tip terminating at the
superior cavoatrial junction. No pneumothorax or other acute
complicating features.
